# Patient Record
Sex: Female | Born: 1955
Health system: Southern US, Community
[De-identification: ages and names within clinical notes are randomized; demographics above are authoritative.]

## PROBLEM LIST (undated history)

## (undated) DIAGNOSIS — K222 Esophageal obstruction: Secondary | ICD-10-CM

## (undated) DIAGNOSIS — J449 Chronic obstructive pulmonary disease, unspecified: Secondary | ICD-10-CM

## (undated) DIAGNOSIS — Z720 Tobacco use: Secondary | ICD-10-CM

## (undated) DIAGNOSIS — K219 Gastro-esophageal reflux disease without esophagitis: Secondary | ICD-10-CM

## (undated) DIAGNOSIS — D472 Monoclonal gammopathy: Secondary | ICD-10-CM

## (undated) DIAGNOSIS — J310 Chronic rhinitis: Secondary | ICD-10-CM

## (undated) DIAGNOSIS — M810 Age-related osteoporosis without current pathological fracture: Secondary | ICD-10-CM

## (undated) HISTORY — DX: Chronic obstructive pulmonary disease, unspecified: J44.9

## (undated) HISTORY — DX: Age-related osteoporosis without current pathological fracture: M81.0

## (undated) HISTORY — DX: Gastro-esophageal reflux disease without esophagitis: K21.9

## (undated) HISTORY — DX: Monoclonal gammopathy: D47.2

## (undated) HISTORY — PX: BREAST EXCISIONAL BIOPSY: SUR124

## (undated) HISTORY — DX: Tobacco use: Z72.0

## (undated) HISTORY — PX: COLONOSCOPY: SHX174

## (undated) HISTORY — PX: TONSILLECTOMY: SHX5217

## (undated) HISTORY — PX: ESOPHAGOGASTRODUODENOSCOPY: SHX1529

## (undated) HISTORY — DX: Chronic rhinitis: J31.0

## (undated) HISTORY — PX: BREAST SURGERY: SHX581

## (undated) HISTORY — PX: TOTAL ABDOMINAL HYSTERECTOMY: SHX209

## (undated) HISTORY — DX: Esophageal obstruction: K22.2

## (undated) HISTORY — PX: BREAST BIOPSY: SHX20

---

## 1998-03-07 ENCOUNTER — Emergency Department (HOSPITAL_COMMUNITY): Admission: EM | Admit: 1998-03-07 | Discharge: 1998-03-07 | Payer: Self-pay

## 2000-06-24 ENCOUNTER — Emergency Department (HOSPITAL_COMMUNITY): Admission: EM | Admit: 2000-06-24 | Discharge: 2000-06-24 | Payer: Self-pay | Admitting: Emergency Medicine

## 2005-12-26 ENCOUNTER — Emergency Department (HOSPITAL_COMMUNITY): Admission: EM | Admit: 2005-12-26 | Discharge: 2005-12-26 | Payer: Self-pay | Admitting: Emergency Medicine

## 2006-02-06 ENCOUNTER — Ambulatory Visit (HOSPITAL_COMMUNITY): Admission: RE | Admit: 2006-02-06 | Discharge: 2006-02-06 | Payer: Self-pay | Admitting: Orthopedic Surgery

## 2006-12-25 ENCOUNTER — Emergency Department (HOSPITAL_COMMUNITY): Admission: EM | Admit: 2006-12-25 | Discharge: 2006-12-25 | Payer: Self-pay | Admitting: Emergency Medicine

## 2008-12-14 ENCOUNTER — Emergency Department (HOSPITAL_COMMUNITY): Admission: EM | Admit: 2008-12-14 | Discharge: 2008-12-14 | Payer: Self-pay | Admitting: Family Medicine

## 2009-01-09 ENCOUNTER — Encounter: Payer: Self-pay | Admitting: Pulmonary Disease

## 2009-01-09 ENCOUNTER — Ambulatory Visit: Payer: Self-pay | Admitting: Pulmonary Disease

## 2009-01-09 DIAGNOSIS — J310 Chronic rhinitis: Secondary | ICD-10-CM | POA: Insufficient documentation

## 2009-01-09 DIAGNOSIS — R05 Cough: Secondary | ICD-10-CM

## 2009-01-09 DIAGNOSIS — R059 Cough, unspecified: Secondary | ICD-10-CM | POA: Insufficient documentation

## 2009-01-09 DIAGNOSIS — F17200 Nicotine dependence, unspecified, uncomplicated: Secondary | ICD-10-CM | POA: Insufficient documentation

## 2009-03-05 ENCOUNTER — Ambulatory Visit: Payer: Self-pay | Admitting: Pulmonary Disease

## 2009-03-05 DIAGNOSIS — K5900 Constipation, unspecified: Secondary | ICD-10-CM | POA: Insufficient documentation

## 2009-10-07 ENCOUNTER — Telehealth (INDEPENDENT_AMBULATORY_CARE_PROVIDER_SITE_OTHER): Payer: Self-pay | Admitting: *Deleted

## 2009-10-07 ENCOUNTER — Ambulatory Visit: Payer: Self-pay | Admitting: Pulmonary Disease

## 2009-10-07 DIAGNOSIS — J209 Acute bronchitis, unspecified: Secondary | ICD-10-CM | POA: Insufficient documentation

## 2009-11-14 ENCOUNTER — Emergency Department (HOSPITAL_COMMUNITY): Admission: EM | Admit: 2009-11-14 | Discharge: 2009-11-14 | Payer: Self-pay | Admitting: Family Medicine

## 2010-07-22 NOTE — Progress Notes (Signed)
Summary: appt  Phone Note Call from Patient Call back at Work Phone [phone]   Caller: Patient Call For: sood Summary of Call: Sinus inf wants to be seen today. Initial call taken by: Darletta Moll,  October 07, 2009 9:34 AM  Follow-up for Phone Call        called and spoke with pt.  pt works at [facility] and believes she has a sinus infection and would like to be seen today.  Pt scheduled to see TP today at noon.  Aundra Millet Reynolds LPN  October 07, 2009 9:43 AM

## 2010-07-22 NOTE — Assessment & Plan Note (Signed)
Summary: sinus infection/mg   Copy to:  Dr. Sherene Sires  CC:  fever for 3 days - sob - prod cough(yellow) - Sinus congestion - sinus drainage (yellow).  History of Present Illness: 55 yo f/u for Cough, borderline COPD, tobacco abuse, and rhinitis.   October 07, 2009--Presents for an acute office visit. Complains of fever for 3 days - sob - prod cough(yellow) - Sinus congestion - sinus drainage (yellow). OTC not helping. She is still trying to quit smoking. Cough is getting worse. Cant sleep.Has to work all weekend. Sinus discharge green/yellow. Denies chest pain, dyspnea, orthopnea, hemoptysis, fever, n/v/d, edema, headache,recent travel or antibiotics.    Current Medications (verified): 1)  Nasonex 50 Mcg/act Susp (Mometasone Furoate) .... Two Sprays Once Daily As Needed 2)  Symbicort 160-4.5 Mcg/act Aero (Budesonide-Formoterol Fumarate) .... Two Puffs Two Times A Day 3)  Proair Hfa 108 (90 Base) Mcg/act Aers (Albuterol Sulfate) .... Two Puffs Qid As Needed  Allergies (verified): No Known Drug Allergies  Comments:  Nurse/Medical Assistant: The patient's medications and allergies were reviewed with the patient and were updated in the Medication and Allergy Lists.  Past History:  Past Medical History: Last updated: 01/09/2009 Cough      - Spirometry 01/09/09 FEV1 2.20(86%), FVC 3.14(93%), FEV1% 70 Rhinitis Tobacco abuse  Past Surgical History: Last updated: 01/09/2009 Hysterectomy Tonsillectomy  Family History: Last updated: 03/05/2009 Unknown family history---Pt is adopted  Social History: Last updated: 01/09/2009 Patient is a current smoker.  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 [employer]  Risk Factors: Smoking Status: current (01/09/2009) Packs/Day: 1.0 (01/09/2009)  Review of Systems      See HPI  Vital Signs:  Patient profile:   55 year old female Weight:      107 pounds O2 Sat:      96 % on Room air Temp:     97.8 degrees F oral Pulse rate:   80 /  minute BP sitting:   98 / 64  (left arm) Cuff size:   regular  Vitals Entered By: Abigail Miyamoto RN (October 07, 2009 11:59 AM)  O2 Flow:  Room air   Impression & Recommendations:  Problem # 1:  ACUTE BRONCHITIS (ICD-466.0)  Omnicef 300mg  two times a day for 7 days Mucinex DM two times a day as needed cough/congeston  Hydromet 1-2 tsp every 4-6 hr as needed cough-may make you sleepy.  Saline nasal rinses.  Patanse 2 puffs two times a day until sample is gone.  Please contact office for sooner follow up if symptoms do not improve or worsen  Her updated medication list for this problem includes:    Symbicort 160-4.5 Mcg/act Aero (Budesonide-formoterol fumarate) .Marland Kitchen..Marland Kitchen Two puffs two times a day    Proair Hfa 108 (90 Base) Mcg/act Aers (Albuterol sulfate) .Marland Kitchen..Marland Kitchen Two puffs qid as needed    Cefdinir 300 Mg Caps (Cefdinir) .Marland Kitchen... 1 by mouth two times a day    Hydromet 5-1.5 Mg/27ml Syrp (Hydrocodone-homatropine) .Marland Kitchen... 1-2 tsp every 4-6 hr as needed cough  Orders: Est. Patient Level IV (91478)  Medications Added to Medication List This Visit: 1)  Symbicort 160-4.5 Mcg/act Aero (Budesonide-formoterol fumarate) .... Two puffs two times a day 2)  Cefdinir 300 Mg Caps (Cefdinir) .Marland Kitchen.. 1 by mouth two times a day 3)  Hydromet 5-1.5 Mg/12ml Syrp (Hydrocodone-homatropine) .Marland Kitchen.. 1-2 tsp every 4-6 hr as needed cough  Complete Medication List: 1)  Nasonex 50 Mcg/act Susp (Mometasone furoate) .... Two sprays once daily as needed 2)  Symbicort 160-4.5 Mcg/act Aero (Budesonide-formoterol fumarate) .... Two puffs two times a day 3)  Proair Hfa 108 (90 Base) Mcg/act Aers (Albuterol sulfate) .... Two puffs qid as needed 4)  Cefdinir 300 Mg Caps (Cefdinir) .Marland Kitchen.. 1 by mouth two times a day 5)  Hydromet 5-1.5 Mg/40ml Syrp (Hydrocodone-homatropine) .Marland Kitchen.. 1-2 tsp every 4-6 hr as needed cough  Patient Instructions: 1)  Omnicef 300mg  two times a day for 7 days 2)  Mucinex DM two times a day as needed cough/congeston    3)  Hydromet 1-2 tsp every 4-6 hr as needed cough-may make you sleepy.  4)  Saline nasal rinses.  5)  Patanse 2 puffs two times a day until sample is gone.  6)  Please contact office for sooner follow up if symptoms do not improve or worsen  Prescriptions: HYDROMET 5-1.5 MG/5ML SYRP (HYDROCODONE-HOMATROPINE) 1-2 tsp every 4-6 hr as needed cough  #8 oz x 0    Entered and Authorized by:   Rubye Oaks NP   Signed by:   Sae Handrich NP on 10/07/2009   Method used:   Print then Give to Patient   RxID:   [id number] CEFDINIR 300 MG CAPS (CEFDINIR) 1 by mouth two times a day  #14 x 0   Entered and Authorized by:   Rubye Oaks NP   Signed by:   Rubye Oaks NP on 10/07/2009   Method used:   Electronically to        [facility]* (retail)       [address]       [address] [place] [address]       [place], [place]  [address]       Ph: [phone]       Fax: [phone]   RxID:   [id number]   Appended Document: sinus infection/mg    Clinical Lists Changes  Orders: Added new Service order of Nebulizer Tx (87564) - Signed

## 2011-01-30 ENCOUNTER — Inpatient Hospital Stay (INDEPENDENT_AMBULATORY_CARE_PROVIDER_SITE_OTHER)
Admission: RE | Admit: 2011-01-30 | Discharge: 2011-01-30 | Disposition: A | Discharge status: Home / Self Care | Payer: 59 | Source: Ambulatory Visit | Attending: Family Medicine | Admitting: Family Medicine

## 2011-01-30 DIAGNOSIS — L089 Local infection of the skin and subcutaneous tissue, unspecified: Secondary | ICD-10-CM

## 2011-03-28 ENCOUNTER — Telehealth: Payer: Self-pay | Admitting: Pulmonary Disease

## 2011-03-28 NOTE — Telephone Encounter (Signed)
Prescribed augmentin 875 mg po bid x 7 days

## 2011-07-13 ENCOUNTER — Telehealth: Payer: Self-pay | Admitting: Adult Health

## 2011-07-13 ENCOUNTER — Other Ambulatory Visit (INDEPENDENT_AMBULATORY_CARE_PROVIDER_SITE_OTHER): Payer: 59

## 2011-07-13 ENCOUNTER — Ambulatory Visit (INDEPENDENT_AMBULATORY_CARE_PROVIDER_SITE_OTHER): Payer: 59 | Admitting: Adult Health

## 2011-07-13 ENCOUNTER — Encounter: Payer: Self-pay | Admitting: Adult Health

## 2011-07-13 VITALS — BP 120/76 | HR 98 | Temp 97.7°F | Ht 65.0 in | Wt 103.0 lb

## 2011-07-13 DIAGNOSIS — J209 Acute bronchitis, unspecified: Secondary | ICD-10-CM

## 2011-07-13 LAB — BETA STREP SCREEN: Streptococcus, Group A Screen (Direct): NEGATIVE

## 2011-07-13 MED ORDER — CEFDINIR 300 MG PO CAPS: 300.0000 mg | ORAL_CAPSULE | Freq: Two times a day (BID) | ORAL | Status: AC

## 2011-07-13 MED ORDER — DOXYCYCLINE HYCLATE 100 MG PO TABS: 100.0000 mg | ORAL_TABLET | Freq: Two times a day (BID) | ORAL | Status: DC

## 2011-07-13 MED ORDER — FIRST-DUKES MOUTHWASH MT SUSP: OROMUCOSAL | Status: DC

## 2011-07-13 NOTE — Assessment & Plan Note (Addendum)
Suspect viral URI /bronchitis  Check strep test   Plan:  Mucinex DM Twice daily  As needed  Cough/congestion  Zyrtec 10mg  At bedtime  For 5 days  Saline nasal rinses As needed   Quit smoking.  Doxycycline 100mg  Twice daily  - to have on hold if symptoms worsen with discolored mucus.  I will call with lab work.  May use Majic mouthwash As needed for sore throat.  Please contact office for sooner follow up if symptoms do not improve or worsen or seek emergency care  follow up Dr. Craige Cotta  In 3 months and As needed

## 2011-07-13 NOTE — Telephone Encounter (Signed)
Omnicef 300 mg Twice daily  For 7 days #14 no refills  Please contact office for sooner follow up if symptoms do not improve or worsen or seek emergency care   follow up as planned and As needed

## 2011-07-13 NOTE — Progress Notes (Signed)
Addended by: Boone Master E on: 07/13/2011 09:41 AM   Modules accepted: Orders

## 2011-07-13 NOTE — Progress Notes (Signed)
Reviewed and agree with assessment/plan. 

## 2011-07-13 NOTE — Patient Instructions (Addendum)
Mucinex DM Twice daily  As needed  Cough/congestion  Zyrtec 10mg  At bedtime  For 5 days  Saline nasal rinses As needed   Quit smoking.  Doxycycline 100mg  Twice daily  - to have on hold if symptoms worsen with discolored mucus.  I will call with lab work.  May use Majic mouthwash As needed for sore throat.  Please contact office for sooner follow up if symptoms do not improve or worsen or seek emergency care  follow up Dr. Craige Cotta  In 3 months and As needed

## 2011-07-13 NOTE — Telephone Encounter (Signed)
New med called into pharmacy. Carron Curie, CMA

## 2011-07-13 NOTE — Telephone Encounter (Signed)
[facility] calling stating they are out of doxycyline and would like an alternative. Please advise, Tammy, thanks  No Known Allergies

## 2011-07-13 NOTE — Progress Notes (Signed)
  Subjective:    Patient ID: Robin Rivera, female    DOB: 1956-06-03, 56 y.o.   MRN: [id number]  HPI 56 yo f/u for Cough, borderline COPD, tobacco abuse, and rhinitis.       07/13/2011 Acute OV  Complains of sore throat, dry cough, low grade up to 99.8, chest congestion for 1 days. No otc used.  No hemoptysis or chest pain. Sore throat is worst symptom. Still smoking .  Advised on smoking cesstation .Does have drainage and nasal congesiton    Past Medical History:  Cough  - Spirometry 01/09/09 FEV1 2.20(86%), FVC 3.14(93%), FEV1% 70  Rhinitis  Tobacco abuse     Review of Systems  Constitutional:   No  weight loss, night sweats,  Fevers, chills, fatigue, or  lassitude.  HEENT:   No headaches,  Difficulty swallowing,  Tooth/dental problems, or   +Sore throat,                No sneezing, itching, ear ache,  +nasal congestion, post nasal drip,   CV:  No chest pain,  Orthopnea, PND, swelling in lower extremities, anasarca, dizziness, palpitations, syncope.   GI  No heartburn, indigestion, abdominal pain, nausea, vomiting, diarrhea, change in bowel habits, loss of appetite, bloody stools.   Resp: o coughing up of blood.    No chest wall deformity  Skin: no rash or lesions.  GU: no dysuria, change in color of urine, no urgency or frequency.  No flank pain, no hematuria   MS:  No joint pain or swelling.  No decreased range of motion.  No back pain.  Psych:  No change in mood or affect. No depression or anxiety.  No memory loss.         Objective:   Physical Exam GEN: A/Ox3; pleasant , NAD, thin   HEENT:  [place]/AT,  EACs-clear, TMs-wnl, NOSE-clear, THROAT-clear, no lesions, no postnasal drip or exudate noted.   NECK:  Supple w/ fair ROM; no JVD; normal carotid impulses w/o bruits; no thyromegaly or nodules palpated; no lymphadenopathy.  RESP  Clear  P & A; w/o, wheezes/ rales/ or rhonchi.no accessory muscle use, no dullness to percussion  CARD:  RRR, no m/r/g  , no  peripheral edema, pulses intact, no cyanosis or clubbing.  GI:   Soft & nt; nml bowel sounds; no organomegaly or masses detected.  Musco: Warm bil, no deformities or joint swelling noted.   Neuro: alert, no focal deficits noted.    Skin: Warm, no lesions or rashes         Assessment & Plan:

## 2012-12-11 ENCOUNTER — Telehealth: Payer: Self-pay | Admitting: Pulmonary Disease

## 2012-12-11 NOTE — Telephone Encounter (Signed)
[phone] pt calling back

## 2012-12-11 NOTE — Telephone Encounter (Addendum)
Called # provided above.   Was advised pt has just went to lunch. Co worker tried to transfer but pts work phone was busy. She will inform pt we called back and have her return call.  Last seen by TP on 06/2011. No recent OV with VS

## 2012-12-11 NOTE — Telephone Encounter (Signed)
I spoke with pt. She stated she has a tooth abscess and is requesting an abx. I advised pt she needed to contact her PCP or her dentists for this. She has not been seen since Jan by TP and has no pending appt. She voiced her understanding. Will forward to VS as an [place]

## 2013-04-21 ENCOUNTER — Emergency Department (HOSPITAL_COMMUNITY)
Admission: EM | Admit: 2013-04-21 | Discharge: 2013-04-21 | Disposition: A | Discharge status: Home / Self Care | Payer: 59 | Source: Home / Self Care | Attending: Emergency Medicine | Admitting: Emergency Medicine

## 2013-04-21 ENCOUNTER — Encounter (HOSPITAL_COMMUNITY): Payer: Self-pay | Admitting: Emergency Medicine

## 2013-04-21 DIAGNOSIS — J019 Acute sinusitis, unspecified: Secondary | ICD-10-CM

## 2013-04-21 MED ORDER — FLUTICASONE PROPIONATE 50 MCG/ACT NA SUSP: 2.0000 | Freq: Every day | NASAL | Status: DC

## 2013-04-21 MED ORDER — AMOXICILLIN-POT CLAVULANATE 875-125 MG PO TABS: 1.0000 | ORAL_TABLET | Freq: Two times a day (BID) | ORAL | Status: DC

## 2013-04-21 NOTE — ED Provider Notes (Signed)
Chief Complaint:   Chief Complaint  Patient presents with  . 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 She smokes a half pack of cigarettes per day. No sick exposures. She has had her flu vaccine this year.  Review of Systems:  Other than noted above, the patient denies any of the following symptoms: Systemic:  No fevers, chills, sweats, weight loss or gain, fatigue, or tiredness. Eye:  No redness or discharge. ENT:  No ear pain, drainage, headache, nasal congestion, drainage, sinus pressure, difficulty swallowing, or sore throat. Neck:  No neck pain or swollen glands. Lungs:  No cough, sputum production, hemoptysis, wheezing, chest tightness, shortness of breath or chest pain. GI:  No abdominal pain, nausea, vomiting or diarrhea.  PMFSH:  Past medical history, family history, social history, meds, and allergies were reviewed.  Physical Exam:   Vital signs:  BP 128/87  Pulse 81  Temp(Src) 98.9 F (37.2 C) (Oral)  Resp 18  SpO2 99% General:  Alert and oriented.  In no distress.  Skin warm and dry. Eye:  No conjunctival injection or drainage. Lids were normal. ENT:  TMs and canals were normal, without erythema or inflammation.  Nasal mucosa was clear and uncongested, without drainage.  Mucous membranes were moist.  Pharynx was clear with no exudate or drainage.  There were no oral ulcerations or lesions. Neck:  Supple, no adenopathy, tenderness or mass. Lungs:  No respiratory distress.  Lungs were clear to auscultation, without wheezes, rales or rhonchi.  Breath sounds were clear and equal bilaterally.  Heart:  Regular rhythm, without gallops, murmers or rubs. Skin:  Clear,  warm, and dry, without rash or lesions.  Assessment:  The encounter diagnosis was Acute sinusitis.  Plan:   1.  Meds:  The following meds were prescribed:   New Prescriptions   AMOXICILLIN-CLAVULANATE (AUGMENTIN) 875-125 MG PER TABLET    Take 1 tablet by mouth 2 (two) times daily.   FLUTICASONE (FLONASE) 50 MCG/ACT NASAL SPRAY    Place 2 sprays into the nose daily.    2.  Patient Education/Counseling:  The patient was given appropriate handouts, self care instructions, and instructed in symptomatic relief.  Strongly suggested smoking cessation. Also suggested inhaling steam and hot compresses to the face.  3.  Follow up:  The patient was told to follow up if no better in 3 to 4 days, if becoming worse in any way, and given some red flag symptoms such as fever or any difficulty breathing which would prompt immediate return.  Follow up here as necessary.      Reuben Likes, MD 04/21/13 [phone]

## 2013-04-21 NOTE — ED Notes (Signed)
[  Pt  Reports  Symptoms  Of  Nasal  Congestion  With  Cough         Yellowish  Drainage    sotrethroat   Symptoms  X  5  Days         Pt is  A  Smoker     Had  Flu  Shot this  Year

## 2013-05-30 ENCOUNTER — Telehealth: Payer: Self-pay | Admitting: Pulmonary Disease

## 2013-05-30 NOTE — Telephone Encounter (Signed)
Pt has not been seen since 2013. Robin Rivera at [facility] is aware that we will not be able to give script for smoking cessation. Atc the pt but all the #'s I tried for her were either busy or no longer in service. Will try back.

## 2013-05-31 NOTE — Telephone Encounter (Signed)
LMTCB for pt at home number listed in chart.

## 2013-05-31 NOTE — Telephone Encounter (Signed)
Spoke with pt and advised that she would need an appt with Dr Craige Cotta for any rx for smoking cessation.   Appt scheduled.

## 2013-05-31 NOTE — Telephone Encounter (Signed)
Patient returning call.  pls call work # [phone]

## 2013-07-23 ENCOUNTER — Ambulatory Visit: Payer: 59 | Admitting: Pulmonary Disease

## 2013-07-26 ENCOUNTER — Encounter: Payer: Self-pay | Admitting: Pulmonary Disease

## 2013-08-16 ENCOUNTER — Ambulatory Visit: Payer: 59 | Admitting: Pulmonary Disease

## 2013-08-16 ENCOUNTER — Encounter: Payer: Self-pay | Admitting: Pulmonary Disease

## 2013-08-16 ENCOUNTER — Ambulatory Visit (INDEPENDENT_AMBULATORY_CARE_PROVIDER_SITE_OTHER): Payer: 59 | Admitting: Pulmonary Disease

## 2013-08-16 VITALS — BP 96/62 | HR 70 | Ht 65.0 in | Wt 104.0 lb

## 2013-08-16 DIAGNOSIS — J449 Chronic obstructive pulmonary disease, unspecified: Secondary | ICD-10-CM | POA: Insufficient documentation

## 2013-08-16 DIAGNOSIS — J438 Other emphysema: Secondary | ICD-10-CM

## 2013-08-16 DIAGNOSIS — F17200 Nicotine dependence, unspecified, uncomplicated: Secondary | ICD-10-CM

## 2013-08-16 DIAGNOSIS — Z720 Tobacco use: Secondary | ICD-10-CM

## 2013-08-16 DIAGNOSIS — J439 Emphysema, unspecified: Secondary | ICD-10-CM

## 2013-08-16 MED ORDER — BUDESONIDE-FORMOTEROL FUMARATE 160-4.5 MCG/ACT IN AERO: 2.0000 | INHALATION_SPRAY | Freq: Two times a day (BID) | RESPIRATORY_TRACT | Status: DC

## 2013-08-16 MED ORDER — BUPROPION HCL ER (SMOKING DET) 150 MG PO TB12: 150.0000 mg | ORAL_TABLET | Freq: Two times a day (BID) | ORAL | Status: DC

## 2013-08-16 NOTE — Assessment & Plan Note (Signed)
She is eager to quit smoking.  Reviewed different options.  She has tried bupropion in the past with positive results.  Will have her use this again.  Reviewed dosing schedule, target quit date, and potential side effects to monitor for.  She will use nicotine patch as bridge for smoking cessation.  Will also arrange for referral to smoking cessation classes with [facility].

## 2013-08-16 NOTE — Assessment & Plan Note (Signed)
She had borderline obstruction on spirometry from 2010, and likely has progression since.  She will need further assessment with PFT at some point.  Will defer this with the hope that she will be off cigarettes in the near future, and then arrange for assessment then.  Will have her continue with symbicort and prn albuterol.  My suspicion is that her inhaler needs will decrease once she stops smoking.

## 2013-08-16 NOTE — Progress Notes (Signed)
Chief Complaint  Patient presents with  . Nicotine Dependence    Here today to discuss smoking cessation options.    History of Present Illness: Robin Rivera is a 58 y.o. female smoker with COPD.  I last saw her in September 2010.  She continues to smoke up to 1 pack per day.  She has not had symbicort for about 1 year.  She uses albuterol frequently.  She gets frequent episodes of wheezing, especially in cold weather.  She is not having cough or sputum, and denies hemoptysis of chest pain.  She is not having leg swelling.  She is able to keep up with her activities.  TESTS: Spirometry 01/09/09 >> FEV1 2.20 (86%), FEV1% 70   Robin Rivera  has a past medical history of Cough; Rhinitis; and Tobacco abuse.  Robin Rivera  has past surgical history that includes Total abdominal hysterectomy and Tonsillectomy.  Prior to Admission medications   Medication Sig Start Date End Date Taking? Authorizing Provider  albuterol (PROVENTIL HFA;VENTOLIN HFA) 108 (90 BASE) MCG/ACT inhaler Inhale 2 puffs into the lungs every 6 (six) hours as needed.   Yes Historical Provider, MD  budesonide-formoterol (SYMBICORT) 160-4.5 MCG/ACT inhaler Inhale 2 puffs into the lungs 2 (two) times daily.   Yes Historical Provider, MD  fluticasone (FLONASE) 50 MCG/ACT nasal spray Place 2 sprays into the nose daily. 04/21/13  Yes Harden Mo, MD    No Known Allergies   Physical Exam:  General - No distress, thin ENT - No sinus tenderness, no oral exudate, no LAN Cardiac - s1s2 regular, no murmur Chest - No wheeze/rales/dullness Back - No focal tenderness Abd - Soft, non-tender Ext - No edema Neuro - Normal strength Skin - No rashes Psych - normal mood, and behavior   Assessment/Plan:  Robin Mires, MD [place] [facility] Pager:  [phone]

## 2013-08-16 NOTE — Patient Instructions (Signed)
Buproprion 150 mg pills >> take 1 pill daily for 4 days, then increase to 1 pill twice per day.  Stop smoking after using Buproprion for one week. Symbicort two puffs twice per day, and rinse mouth after each use Will refer to smoking cessation classes Follow up in 6 to 8 weeks

## 2013-09-27 ENCOUNTER — Ambulatory Visit: Payer: 59 | Admitting: Pulmonary Disease

## 2013-10-01 ENCOUNTER — Encounter: Payer: Self-pay | Admitting: Pulmonary Disease

## 2014-05-29 ENCOUNTER — Telehealth: Payer: Self-pay | Admitting: Pulmonary Disease

## 2014-05-29 MED ORDER — AMOXICILLIN 500 MG PO CAPS: 500.0000 mg | ORAL_CAPSULE | Freq: Two times a day (BID) | ORAL | Status: DC

## 2014-05-29 NOTE — Telephone Encounter (Signed)
Can send script for amoxicillin 500 mg bid, dispense 20 with no refills.  She still needs to keep f/u with Tammy Parrett.

## 2014-05-29 NOTE — Telephone Encounter (Signed)
Called and spoke to pt. Pt c/o sinus congestion and pressure. Pt stated she has mild SOB in morning. Pt denies headache, CP/tightness and f/c/s. Pt is requesting abx. Pt is taking mucinex prn and theraflu. Pt last seen on 08/16/13 by VS. Pt has pending appt with TP on 06/02/14.   Dr. Halford Chessman please advise.  No Known Allergies

## 2014-05-29 NOTE — Telephone Encounter (Signed)
Called and left message with pt's co-worker. (Pt gave ok to leave message with coworker). Informed coworker of the recs per VS. Rx sent to preferred pharmacy. Co-worker stated she would relay the message to pt. Nothing further needed.

## 2014-06-02 ENCOUNTER — Ambulatory Visit (INDEPENDENT_AMBULATORY_CARE_PROVIDER_SITE_OTHER)
Admission: RE | Admit: 2014-06-02 | Discharge: 2014-06-02 | Disposition: A | Discharge status: Home / Self Care | Payer: 59 | Source: Ambulatory Visit | Attending: Adult Health | Admitting: Adult Health

## 2014-06-02 ENCOUNTER — Encounter: Payer: Self-pay | Admitting: Adult Health

## 2014-06-02 ENCOUNTER — Ambulatory Visit (INDEPENDENT_AMBULATORY_CARE_PROVIDER_SITE_OTHER): Payer: 59 | Admitting: Adult Health

## 2014-06-02 VITALS — BP 124/64 | HR 82 | Temp 98.2°F

## 2014-06-02 DIAGNOSIS — J439 Emphysema, unspecified: Secondary | ICD-10-CM

## 2014-06-02 MED ORDER — PREDNISONE 10 MG PO TABS: ORAL_TABLET | ORAL | Status: DC

## 2014-06-02 NOTE — Progress Notes (Signed)
   Subjective:    Patient ID: Robin Rivera, female    DOB: September 13, 1955, 58 y.o.   MRN: [id number]  HPI 58 yo with COPD , active smoker   06/02/2014 Acute OV  Complains of prod cough with gree/yellow mucus, DOE, wheezing x6 weeks. Worse for the last week . Was called in Was called in Amoxicillin on 12/10 for 10 days . Feels she is starting to feel better.  Denies any f/c/s, n/v/d, hemoptysis.  Still smoking, discussed cessation .  Using mucinex .  Takes Symbicort Twice daily      Review of Systems Constitutional:   No  weight loss, night sweats,  Fevers, chills, + fatigue, or  lassitude.  HEENT:   No headaches,  Difficulty swallowing,  Tooth/dental problems, or  Sore throat,                No sneezing, itching, ear ache,  +nasal congestion, post nasal drip,   CV:  No chest pain,  Orthopnea, PND, swelling in lower extremities, anasarca, dizziness, palpitations, syncope.   GI  No heartburn, indigestion, abdominal pain, nausea, vomiting, diarrhea, change in bowel habits, loss of appetite, bloody stools.   Resp:    No chest wall deformity  Skin: no rash or lesions.  GU: no dysuria, change in color of urine, no urgency or frequency.  No flank pain, no hematuria   MS:  No joint pain or swelling.  No decreased range of motion.  No back pain.  Psych:  No change in mood or affect. No depression or anxiety.  No memory loss.         Objective:   Physical Exam GEN: A/Ox3; pleasant , NAD, well nourished   HEENT:  [place]/AT,  EACs-clear, TMs-wnl, NOSE-clear drainage  THROAT-clear, no lesions, no postnasal drip or exudate noted.   NECK:  Supple w/ fair ROM; no JVD; normal carotid impulses w/o bruits; no thyromegaly or nodules palpated; no lymphadenopathy.  RESP  Few exp wheezes no accessory muscle use, no dullness to percussion  CARD:  RRR, no m/r/g  , no peripheral edema, pulses intact, no cyanosis or clubbing.  GI:   Soft & nt; nml bowel sounds; no organomegaly or masses  detected.  Musco: Warm bil, no deformities or joint swelling noted.   Neuro: alert, no focal deficits noted.    Skin: Warm, no lesions or rashes         Assessment & Plan:

## 2014-06-02 NOTE — Progress Notes (Signed)
Reviewed and agree with assessment/plan. 

## 2014-06-02 NOTE — Assessment & Plan Note (Signed)
Slow resolving Flare  Check cxr   Plan  Prednisone taper over next week  Finish Amoxicillin  Saline nasal rinses  Mucinex DM Twice daily As needed  Cough/congestion .  Continue on Symbicort two puffs twice per day, and rinse mouth after each use Keep working on stopping smoking  Follow up Dr. Halford Chessman  In 3 months and As needed

## 2014-06-02 NOTE — Patient Instructions (Addendum)
Prednisone taper over next week  Finish Amoxicillin  Saline nasal rinses  Mucinex DM Twice daily As needed  Cough/congestion .  Continue on Symbicort two puffs twice per day, and rinse mouth after each use Keep working on stopping smoking  Follow up Dr. Halford Chessman  In 3 months and As needed

## 2014-06-05 NOTE — Progress Notes (Signed)
Quick Note:  Called spoke with patient, advised of cxr results / recs as stated by TP. Pt verbalized her understanding and denied any questions. ______ 

## 2015-11-13 ENCOUNTER — Telehealth: Payer: Self-pay | Admitting: Pulmonary Disease

## 2015-11-17 NOTE — Telephone Encounter (Signed)
Called # provided, spoke with pt's son.  Was advised to call pt on her cell # at [phone] [email or web address] for pt on cell #

## 2015-11-17 NOTE — Telephone Encounter (Signed)
Patient is returning call, there is a referral for lung cancer screening, believe this is why she was called.  CB is [phone] (work).

## 2015-11-19 NOTE — Telephone Encounter (Signed)
Routing to Lung Nodule Pool for follow up.

## 2015-12-03 NOTE — Telephone Encounter (Signed)
LVM for pt to return call

## 2015-12-28 NOTE — Telephone Encounter (Signed)
LVM for pt to return call

## 2015-12-31 ENCOUNTER — Other Ambulatory Visit: Payer: Self-pay | Admitting: Acute Care

## 2015-12-31 DIAGNOSIS — F17210 Nicotine dependence, cigarettes, uncomplicated: Secondary | ICD-10-CM

## 2016-01-20 NOTE — Telephone Encounter (Signed)
Robin Rivera can we close this message?  It looks like the pt has not been contacted about the lung cancer screening, but it looks like many messages have been left for her.  thanks

## 2016-01-20 NOTE — Telephone Encounter (Signed)
Pt is scheduled for 8/16 with SG for SDMV.  Nothing further is needed.

## 2016-02-03 ENCOUNTER — Ambulatory Visit (INDEPENDENT_AMBULATORY_CARE_PROVIDER_SITE_OTHER)
Admission: RE | Admit: 2016-02-03 | Discharge: 2016-02-03 | Disposition: A | Discharge status: Home / Self Care | Payer: 59 | Source: Ambulatory Visit | Attending: Acute Care | Admitting: Acute Care

## 2016-02-03 ENCOUNTER — Telehealth: Payer: Self-pay

## 2016-02-03 ENCOUNTER — Ambulatory Visit (INDEPENDENT_AMBULATORY_CARE_PROVIDER_SITE_OTHER): Payer: 59 | Admitting: Acute Care

## 2016-02-03 ENCOUNTER — Encounter: Payer: Self-pay | Admitting: Acute Care

## 2016-02-03 DIAGNOSIS — Z87891 Personal history of nicotine dependence: Secondary | ICD-10-CM

## 2016-02-03 DIAGNOSIS — F17210 Nicotine dependence, cigarettes, uncomplicated: Secondary | ICD-10-CM | POA: Diagnosis not present

## 2016-02-03 NOTE — Telephone Encounter (Signed)
CALL REPORT from [facility] for CT scan. Marciano Sequin did not know reason for call report. Recommended results be reviewed.   Routed to Eric Form for follow up.

## 2016-02-03 NOTE — Progress Notes (Signed)
Shared Decision Making Visit Lung Cancer Screening Program [phone])   Eligibility:  Age 60 y.o.  Pack Years Smoking History Calculation  34.5 pack years (# packs/per year x # years smoked)  Recent History of coughing up blood  no  Unexplained weight loss? no ( >Than 15 pounds within the last 6 months )  Prior History Lung / other cancer no (Diagnosis within the last 5 years already requiring surveillance chest CT Scans).  Smoking Status Current Smoker  Former Smokers: Years since quit:NA  Quit Date: NA  Visit Components:  Discussion included one or more decision making aids. yes  Discussion included risk/benefits of screening. yes  Discussion included potential follow up diagnostic testing for abnormal scans. yes  Discussion included meaning and risk of over diagnosis. yes  Discussion included meaning and risk of False Positives. yes  Discussion included meaning of total radiation exposure. yes  Counseling Included:  Importance of adherence to annual lung cancer LDCT screening. yes  Impact of comorbidities on ability to participate in the program. yes  Ability and willingness to under diagnostic treatment. yes  Smoking Cessation Counseling:  Current Smokers:   Discussed importance of smoking cessation. yes  Information about tobacco cessation classes and interventions provided to patient. yes  Patient provided with "ticket" for LDCT Scan. yes  Symptomatic Patient. no  Counseling  Diagnosis Code: Tobacco Use Z72.0  Asymptomatic Patient yes  Counseling (Intermediate counseling: > three minutes counseling) [id number]  Former Smokers:   Discussed the importance of maintaining cigarette abstinence. yes  Diagnosis Code: Personal History of Nicotine Dependence. [id number]  Information about tobacco cessation classes and interventions provided to patient. Yes  Patient provided with "ticket" for LDCT Scan. yes  Written Order for Lung Cancer Screening with LDCT  placed in Epic. Yes (CT Chest Lung Cancer Screening Low Dose W/O CM) [id number] Z12.2-Screening of respiratory organs Z87.891-Personal history of nicotine dependence  I have spent 20 minutes of face to face time with Robin Rivera discussing the risks and benefits of lung cancer screening. We viewed a power point together that explained in detail the above noted topics. We paused at intervals to allow for questions to be asked and answered to ensure understanding.We discussed that the single most powerful action that she  can take to decrease her  risk of developing lung cancer is to quit smoking. We discussed whether or not she  is ready to commit to setting a quit date. She is currently not ready to set a quit date but has started the smoking cessation classes..We discussed options for tools to aid in quitting smoking including nicotine replacement therapy, non-nicotine medications, support groups, Quit Smart classes, and behavior modification. We discussed that often times setting smaller, more achievable goals, such as eliminating 1 cigarette a day for a week and then 2 cigarettes a day for a week can be helpful in slowly decreasing the number of cigarettes smoked. This allows for a sense of accomplishment as well as providing a clinical benefit. I gave her  the " Be Stronger Than Your Excuses" card with contact information for community resources, classes, free nicotine replacement therapy, and access to mobile apps, text messaging, and on-line smoking cessation help. I have also given her  my card and contact information in the event she needs to contact me. We discussed the time and location of the scan, and that either June Leap, CMA, or I will call with the results within 24-48 hours of receiving them. I have provided her  with a copy of the power point we viewed  as a resource in the event they need reinforcement of the concepts we discussed today in the office. The patient verbalized understanding of all  of  the above and had no further questions upon leaving the office. They have my contact information in the event they have any further questions.   Robin Spatz, NP 02/03/2016

## 2016-02-04 ENCOUNTER — Encounter: Payer: Self-pay | Admitting: Internal Medicine

## 2016-02-04 ENCOUNTER — Ambulatory Visit (INDEPENDENT_AMBULATORY_CARE_PROVIDER_SITE_OTHER): Payer: 59 | Admitting: Internal Medicine

## 2016-02-04 ENCOUNTER — Other Ambulatory Visit: Payer: 59

## 2016-02-04 VITALS — BP 94/62 | HR 71 | Ht 65.0 in | Wt 100.2 lb

## 2016-02-04 DIAGNOSIS — J449 Chronic obstructive pulmonary disease, unspecified: Secondary | ICD-10-CM

## 2016-02-04 DIAGNOSIS — R0789 Other chest pain: Secondary | ICD-10-CM

## 2016-02-04 DIAGNOSIS — R1310 Dysphagia, unspecified: Secondary | ICD-10-CM | POA: Insufficient documentation

## 2016-02-04 DIAGNOSIS — I2510 Atherosclerotic heart disease of native coronary artery without angina pectoris: Secondary | ICD-10-CM | POA: Insufficient documentation

## 2016-02-04 NOTE — Progress Notes (Signed)
Subjective:     Patient ID: Robin Rivera, female   DOB: Sep 27, 1955, 60 y.o.   MRN: [id number]  HPI    OV 02/04/2016  Chief Complaint  Patient presents with  . Follow-up    Pt here to revirew CT chest. Pt denies any current complaints.     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 Has not seen him in over 2 years. Has been maintained on Symbicort. She does not recollect when her last Pulmonary  function test is. She is here because of abnormal findings on lung cancer screening CT including finding of cor pulmonale/hypertension. However she does also have symptoms  In terms of COPD: She has chronic shortness of breath and cough moderate and mild intensity respectively. Present on exertion while running around dogs. Relieved by rest. The cough is mild. She is on Symbicort. Overall she feels is a stable. However she does not know when her last PFT was. She also does not recollect having had an alpha 1 check.. CT shows evidence of pulmonary hypertension but she denies any edema or orthopnea paroxysmal nocturnal dyspnea  New symptom of dysphagia: This is been going on for a few months. Both solids and liquids get stuck and she points to lower chest area. It is mild to moderate in intensity. It is not progressive. Lung cancer screening CT does show esophageal air fluid level. She has never seen a GI doctor  Coronary artery calcification: This is noted on the CT scan. She does have atypical chest pains at rest but never on exertion. Last cardiac stress test was several years ago and she does not remember who it was done by. She does not have a cardiologist. There is no diaphoresis or palpitations.    Ct Chest Lung Ca Screen Low Dose W/o Cm  Result Date: 02/03/2016 CLINICAL DATA:  34.5 pack-year smoking history.  Asymptomatic. EXAM: CT CHEST WITHOUT CONTRAST LOW-DOSE FOR LUNG CANCER SCREENING TECHNIQUE: Multidetector CT imaging of  the chest was performed following the standard protocol without IV contrast. COMPARISON:  Chest radiograph 06/02/2014.  No prior CT. FINDINGS: Cardiovascular: Bovine arch. Aortic and branch vessel atherosclerosis. Normal heart size, without pericardial effusion. Pulmonary artery enlargement, including a 3.9 cm left pulmonary artery and a 2.9 cm right pulmonary artery. Mediastinum/Nodes: No mediastinal or definite hilar adenopathy, given limitations of unenhanced CT. Fluid level in the esophagus on image 34/series 2. Prominent thoracic duct, including on image 48/series 2. Lungs/Pleura: No pleural fluid. Moderate centrilobular emphysema. Scattered bilateral pulmonary nodules, including at maximally volume derived equivalent diameter 3.1 mm in the right upper lobe/apex. Upper abdomen: Left hepatic lobe hypo attenuating lesion is likely a cyst. Normal imaged portions of the spleen, stomach, pancreas, adrenal glands, kidneys. Abdominal aortic atherosclerosis. Musculoskeletal: No acute osseous abnormality. IMPRESSION: 1. Lung-RADS Category 2, benign appearance or behavior. Continue annual screening with low-dose chest CT without contrast in 12 months. 2. Pulmonary artery enlargement, including a significantly enlarged left pulmonary artery. This suggests pulmonary arterial hypertension. Given the extent of pulmonary artery enlargement, consider echocardiography for further characterization. 3. Esophageal air fluid level suggests dysmotility or gastroesophageal reflux. 4.  Aortic atherosclerosis. These results will be called to the ordering clinician or representative by the Radiologist Assistant, and communication documented in the PACS or zVision Dashboard. Electronically Signed   By: Abigail Miyamoto M.D.   On: 02/03/2016 11:26      has a past medical history of Cough; Rhinitis;  and Tobacco abuse.   reports that she has been smoking Cigarettes.  She has a 34.50 pack-year smoking history. She has never used smokeless  tobacco.  Past Surgical History:  Procedure Laterality Date  . TONSILLECTOMY    . TOTAL ABDOMINAL HYSTERECTOMY      No Known Allergies  Immunization History  Administered Date(s) Administered  . Influenza Split 04/12/2011, 04/15/2013, 03/25/2014  . Influenza,inj,Quad PF,36+ Mos 03/21/2015    Family History  Problem Relation Age of Onset  . Adopted: Yes     Current Outpatient Prescriptions:  .  albuterol (PROVENTIL HFA;VENTOLIN HFA) 108 (90 BASE) MCG/ACT inhaler, Inhale 2 puffs into the lungs every 6 (six) hours as needed., Disp: , Rfl:  .  budesonide-formoterol (SYMBICORT) 160-4.5 MCG/ACT inhaler, Inhale 2 puffs into the lungs 2 (two) times daily. (Patient not taking: Reported on 02/04/2016), Disp: 1 Inhaler, Rfl: 5 .  buPROPion (ZYBAN) 150 MG 12 hr tablet, Take 1 tablet (150 mg total) by mouth 2 (two) times daily. (Patient not taking: Reported on 02/04/2016), Disp: 60 tablet, Rfl: 3 .  fluticasone (FLONASE) 50 MCG/ACT nasal spray, Place 2 sprays into the nose daily. (Patient not taking: Reported on 02/04/2016), Disp: 16 g, Rfl: 0     Review of Systems     Objective:   Physical Exam  Constitutional: She is oriented to person, place, and time. She appears well-developed and well-nourished. No distress.  HENT:  Head: Normocephalic and atraumatic.  Right Ear: External ear normal.  Left Ear: External ear normal.  Mouth/Throat: Oropharynx is clear and moist. No oropharyngeal exudate.  Eyes: Conjunctivae and EOM are normal. Pupils are equal, round, and reactive to light. Right eye exhibits no discharge. Left eye exhibits no discharge. No scleral icterus.  Neck: Normal range of motion. Neck supple. No JVD present. No tracheal deviation present. No thyromegaly present.  Cardiovascular: Normal rate, regular rhythm, normal heart sounds and intact distal pulses.  Exam reveals no gallop and no friction rub.   No murmur heard. Pulmonary/Chest: Effort normal and breath sounds normal. No  respiratory distress. She has no wheezes. She has no rales. She exhibits no tenderness.  Abdominal: Soft. Bowel sounds are normal. She exhibits no distension and no mass. There is no tenderness. There is no rebound and no guarding.  Musculoskeletal: Normal range of motion. She exhibits no edema or tenderness.  Lymphadenopathy:    She has no cervical adenopathy.  Neurological: She is alert and oriented to person, place, and time. She has normal reflexes. No cranial nerve deficit. She exhibits normal muscle tone. Coordination normal.  Skin: Skin is warm and dry. No rash noted. She is not diaphoretic. No erythema. No pallor.  Psychiatric: She has a normal mood and affect. Her behavior is normal. Judgment and thought content normal.  Vitals reviewed.  Vitals:   02/04/16 1210  BP: 94/62  Pulse: 71   Vitals:   02/04/16 1210  BP: 94/62  Pulse: 71  SpO2: 98%  Weight: 100 lb 3.2 oz (45.5 kg)  Height: 5\' 5"  (1.651 m)        Assessment:       ICD-9-CM ICD-10-CM   1. Chronic obstructive pulmonary disease, unspecified COPD type ([place]) 496 J44.9   2. Dysphagia 787.20 R13.10   3. Atypical chest pain 786.59 R07.89   4. Coronary artery calcification seen on CAT scan 414.00 I25.10        Plan:     Chronic obstructive pulmonary disease, unspecified COPD type ([place]) - need to  restagel; do full PFT and return to see Dr Halford Chessman - alpha 1 blood test  Dysphagia - refer [facility] - Might need autoimmune workup based on pulmonary hypertension findings and esophageal dysmotility  Atypical chest pain Coronary artery calcification seen on CAT scan - rfer [facility]   Followup  - Dr Halford Chessman but after PFT in next few weeks to few months  Dr. Brand Males, M.D., [facility].C.P Pulmonary and Critical Care Medicine Staff Physician [facility] Pager: [phone], If no answer or between  15:00h - 7:00h: call [phone]  02/04/2016 12:35 PM

## 2016-02-04 NOTE — Patient Instructions (Addendum)
Chronic obstructive pulmonary disease, unspecified COPD type ([place]) - need to restagel; do full PFT and return to see Dr Halford Chessman - alpha 1 blood test  Dysphagia - refer [facility] - Might need autoimmune for worwokruop  Atypical chest pain Coronary artery calcification seen on CAT scan - rfer [facility]   Followup  - Dr Halford Chessman but after PFT in next few weeks to few months

## 2016-02-09 ENCOUNTER — Other Ambulatory Visit: Payer: Self-pay | Admitting: Acute Care

## 2016-02-09 ENCOUNTER — Ambulatory Visit (INDEPENDENT_AMBULATORY_CARE_PROVIDER_SITE_OTHER): Payer: 59 | Admitting: Internal Medicine

## 2016-02-09 DIAGNOSIS — R0789 Other chest pain: Secondary | ICD-10-CM

## 2016-02-09 DIAGNOSIS — F17210 Nicotine dependence, cigarettes, uncomplicated: Secondary | ICD-10-CM

## 2016-02-09 DIAGNOSIS — J449 Chronic obstructive pulmonary disease, unspecified: Secondary | ICD-10-CM

## 2016-02-09 LAB — PULMONARY FUNCTION TEST
DL/VA % PRED: 68 %
DL/VA: 3.43 ml/min/mmHg/L
DLCO COR % PRED: 64 %
DLCO COR: 17.49 ml/min/mmHg
DLCO UNC % PRED: 63 %
DLCO unc: 17.05 ml/min/mmHg
FEF 25-75 PRE: 0.68 L/s
FEF 25-75 Post: 0.68 L/sec
FEF2575-%CHANGE-POST: 0 %
FEF2575-%PRED-POST: 27 %
FEF2575-%Pred-Pre: 27 %
FEV1-%CHANGE-POST: 0 %
FEV1-%Pred-Post: 53 %
FEV1-%Pred-Pre: 52 %
FEV1-PRE: 1.45 L
FEV1-Post: 1.46 L
FEV1FVC-%CHANGE-POST: 3 %
FEV1FVC-%Pred-Pre: 75 %
FEV6-%CHANGE-POST: -2 %
FEV6-%Pred-Post: 68 %
FEV6-%Pred-Pre: 70 %
FEV6-PRE: 2.41 L
FEV6-Post: 2.36 L
FEV6FVC-%Change-Post: 0 %
FEV6FVC-%PRED-PRE: 102 %
FEV6FVC-%Pred-Post: 102 %
FVC-%Change-Post: -2 %
FVC-%Pred-Post: 67 %
FVC-%Pred-Pre: 69 %
FVC-Post: 2.39 L
FVC-Pre: 2.45 L
POST FEV1/FVC RATIO: 61 %
Post FEV6/FVC ratio: 99 %
Pre FEV1/FVC ratio: 59 %
Pre FEV6/FVC Ratio: 98 %
RV % PRED: 174 %
RV: 3.64 L
TLC % pred: 114 %
TLC: 6.13 L

## 2016-02-09 NOTE — Telephone Encounter (Signed)
The results of the low-dose CT scan was called to Robin Rivera. I explained to her that her scan was read as a Lung RADS 2: nodules that are benign in appearance and behavior with a very low likelihood of becoming a clinically active cancer due to size or lack of growth. Recommendation per radiology is for a repeat LDCT in 12 months. I also reviewed with her that the scan revealed pulmonary artery enlargement including significantly enlarged left pulmonary artery indicative of pulmonary arterial hypertension. I also explained to her that there was esophageal air-fluid level suggesting dysmotility or reflux in her esophagus, and aortic atherosclerosis. Due to the incidental findings on the scan, she was scheduled to see Dr. Chase Caller  02/04/2016 for clinical follow-up . She verbalized understanding of all of the above. She understands that we will place the order for a follow-up screening scan in August 2018 as was as the recommendation per radiology. She had no further questions upon completing the call, but has my contact information in the event she has any further questions.

## 2016-02-11 LAB — ALPHA-1 ANTITRYPSIN PHENOTYPE: A1 ANTITRYPSIN: 163 mg/dL (ref 83–199)

## 2016-02-15 ENCOUNTER — Encounter: Payer: Self-pay | Admitting: Adult Health

## 2016-02-15 ENCOUNTER — Ambulatory Visit (INDEPENDENT_AMBULATORY_CARE_PROVIDER_SITE_OTHER): Payer: 59 | Admitting: Adult Health

## 2016-02-15 VITALS — BP 114/70 | HR 79 | Temp 98.2°F | Ht 65.0 in | Wt 98.0 lb

## 2016-02-15 DIAGNOSIS — Z23 Encounter for immunization: Secondary | ICD-10-CM

## 2016-02-15 DIAGNOSIS — H5789 Other specified disorders of eye and adnexa: Secondary | ICD-10-CM

## 2016-02-15 DIAGNOSIS — H578 Other specified disorders of eye and adnexa: Secondary | ICD-10-CM | POA: Diagnosis not present

## 2016-02-15 DIAGNOSIS — F17200 Nicotine dependence, unspecified, uncomplicated: Secondary | ICD-10-CM | POA: Diagnosis not present

## 2016-02-15 DIAGNOSIS — J449 Chronic obstructive pulmonary disease, unspecified: Secondary | ICD-10-CM

## 2016-02-15 MED ORDER — ALBUTEROL SULFATE HFA 108 (90 BASE) MCG/ACT IN AERS: 2.0000 | INHALATION_SPRAY | Freq: Four times a day (QID) | RESPIRATORY_TRACT | 5 refills | Status: DC | PRN

## 2016-02-15 MED ORDER — TIOTROPIUM BROMIDE-OLODATEROL 2.5-2.5 MCG/ACT IN AERS: 2.0000 | INHALATION_SPRAY | Freq: Every day | RESPIRATORY_TRACT | 5 refills | Status: DC

## 2016-02-15 MED FILL — VENTOLIN HFA 90 MCG INHALER: 108 (90 BAS | 25 days supply | Qty: 18 | Fill #0

## 2016-02-15 MED FILL — STIOLTO RESPIMAT INHAL SPRY: 2.5-2.5 | 30 days supply | Qty: 4 | Fill #0

## 2016-02-15 NOTE — Patient Instructions (Signed)
Begin Stiolto 2 puffs daily .  Pneumovax today .  See eye doctor as discussed .  Warm compresses to eye lid.  follow up Dr. Halford Chessman  In 2-3 months and As needed

## 2016-02-15 NOTE — Assessment & Plan Note (Signed)
Smoking cessation  Discussed nicotine patches and classes

## 2016-02-15 NOTE — Addendum Note (Signed)
Addended by: Osa Craver on: 02/15/2016 03:56 PM   Modules accepted: Orders

## 2016-02-15 NOTE — Assessment & Plan Note (Addendum)
Moderate to severe COPD with significant decline since 2010. Patient advised on smoking cessation. Begin Stiolto .  ? Pulmonary HTN seen on screening CT chest - she is seeing cardiology next month due Coronary calfification was seen .  May need echo to evaluate. Cont to follow   Plan  Patient Instructions  Begin Stiolto 2 puffs daily .  Pneumovax today .  See eye doctor as discussed .  Warm compresses to eye lid.  follow up Dr. Halford Chessman  In 2-3 months and As needed

## 2016-02-15 NOTE — Progress Notes (Signed)
Subjective:    Patient ID: Robin Rivera, female    DOB: 09-29-1955, 60 y.o.   MRN: [id number]  HPI 60 year old female, active smoker followed for COPD  02/15/2016 follow-up: COPD  Returns for a two-week follow-up. It was recently had a lung cancer screening CT that showed possible pulmonary hypertension. Set up for pulmonary function test that showed moderate to severe COPD with an FEV1 at 53%, ratio 61, FVC 67%, no significant bronchodilator response. DLCO 63% . This is significantly decreased from previous FEV1 at 86% in 2010. Alpha-1 testing was normal. She was referred to cardiology. She has an appointment next month. Discussed smoking cessation . She is going to the quit smoking classes.  Previously on Symbicort in the past. However, has not been on for greater than one year.  Patient says that she's had some right eye irritation the last 1-2 days. Denies any known injury or visual changes. No discharge..  Past Medical History:  Diagnosis Date  . Cough   . Rhinitis   . Tobacco abuse    Current Outpatient Prescriptions on File Prior to Visit  Medication Sig Dispense Refill  . albuterol (PROVENTIL HFA;VENTOLIN HFA) 108 (90 BASE) MCG/ACT inhaler Inhale 2 puffs into the lungs every 6 (six) hours as needed.    . budesonide-formoterol (SYMBICORT) 160-4.5 MCG/ACT inhaler Inhale 2 puffs into the lungs 2 (two) times daily. 1 Inhaler 5  . buPROPion (ZYBAN) 150 MG 12 hr tablet Take 1 tablet (150 mg total) by mouth 2 (two) times daily. (Patient not taking: Reported on 02/15/2016) 60 tablet 3  . fluticasone (FLONASE) 50 MCG/ACT nasal spray Place 2 sprays into the nose daily. (Patient not taking: Reported on 02/15/2016) 16 g 0   No current facility-administered medications on file prior to visit.      Review of Systems Constitutional:   No  weight loss, night sweats,  Fevers, chills, fatigue, or  lassitude.  HEENT:   No headaches,  Difficulty swallowing,  Tooth/dental problems, or  Sore  throat,                No sneezing, itching, ear ache, nasal congestion, post nasal drip,   CV:  No chest pain,  Orthopnea, PND, swelling in lower extremities, anasarca, dizziness, palpitations, syncope.   GI  No heartburn, indigestion, abdominal pain, nausea, vomiting, diarrhea, change in bowel habits, loss of appetite, bloody stools.   Resp:   No chest wall deformity  Skin: no rash or lesions.  GU: no dysuria, change in color of urine, no urgency or frequency.  No flank pain, no hematuria   MS:  No joint pain or swelling.  No decreased range of motion.  No back pain.  Psych:  No change in mood or affect. No depression or anxiety.  No memory loss.         Objective:   Physical Exam  Vitals:   02/15/16 1521  BP: 114/70  Pulse: 79  Temp: 98.2 F (36.8 C)  TempSrc: Oral  SpO2: 99%  Weight: 98 lb (44.5 kg)  Height: 5\' 5"  (1.651 m)   GEN: A/Ox3; pleasant , NAD, thin    HEENT:  [place]/AT,  EACs-clear, TMs-wnl, NOSE-clear, THROAT-clear, no lesions, no postnasal drip or exudate noted.  Along right upper lid w/ mild swelling/redness, no induration noted. Mild conjunctival injection  Along inner to mid eye lid with ?nevus .   NECK:  Supple w/ fair ROM; no JVD; normal carotid impulses w/o bruits; no thyromegaly or nodules  palpated; no lymphadenopathy.    RESP  Clear  P & A; w/o, wheezes/ rales/ or rhonchi. no accessory muscle use, no dullness to percussion  CARD:  RRR, no m/r/g  , no peripheral edema, pulses intact, no cyanosis or clubbing.  GI:   Soft & nt; nml bowel sounds; no organomegaly or masses detected.   Musco: Warm bil, no deformities or joint swelling noted.   Neuro: alert, no focal deficits noted.    Skin: Warm, no lesions or rashes  Jocelyn Lowery NP-C  [facility]  02/15/2016

## 2016-02-15 NOTE — Assessment & Plan Note (Signed)
Right eye irritation w/ upper lid swelling ? Stye developing .  Also she has unusal looking nevus aloing mid upper lid ? Etiology  Advised she need to see opthalmology for evaluation.

## 2016-02-16 NOTE — Progress Notes (Signed)
Called and spoke to pt. Informed her of the results per MR. Pt verbalized understanding and denied any further questions or concerns at this time.  

## 2016-02-16 NOTE — Progress Notes (Signed)
Reviewed and agree with assessment/plan.  Chesley Mires, MD [facility]/Critical Care 02/16/2016, 8:59 AM Pager:  [phone]

## 2016-02-25 NOTE — Progress Notes (Signed)
pft  

## 2016-03-10 NOTE — Progress Notes (Signed)
Cardiology Office Note   Date:  03/11/2016   ID:  Robin Rivera, DOB September 18, 1955, MRN [id number]  PCP:  No PCP Per Patient  Cardiologist:   Minus Breeding, MD  Referring:  Rexene Edison, NP  Chief Complaint  Patient presents with  . Coronary Calcium     History of Present Illness: Robin Rivera is a 60 y.o. female who presents for follow of coronary calcium that was seen on chest CT.  She has no past cardiac history. However, she does have COPD and long smoking history. She reports when she was 10 having heart catheterization for a murmur but she had no abnormalities noted.  She doesn't exercise routinely. She does walking at work. With this she does have some shortness of breath which has been slowly progressive. There was a question of pulmonary hypertension was noted on a CT scan. She doesn't however have any chest pressure, neck or arm discomfort. She doesn't describe any palpitations, presyncope or syncope. She's not having any PND or orthopnea.   Past Medical History:  Diagnosis Date  . Rhinitis   . Tobacco abuse     Past Surgical History:  Procedure Laterality Date  . TONSILLECTOMY    . TOTAL ABDOMINAL HYSTERECTOMY       Current Outpatient Prescriptions  Medication Sig Dispense Refill  . albuterol (PROVENTIL HFA;VENTOLIN HFA) 108 (90 Base) MCG/ACT inhaler Inhale 2 puffs into the lungs every 6 (six) hours as needed. 1 Inhaler 5  . buPROPion (ZYBAN) 150 MG 12 hr tablet Take 1 tablet (150 mg total) by mouth 2 (two) times daily. 60 tablet 3  . fluticasone (FLONASE) 50 MCG/ACT nasal spray Place 2 sprays into the nose daily. 16 g 0  . Tiotropium Bromide-Olodaterol (STIOLTO RESPIMAT) 2.5-2.5 MCG/ACT AERS Inhale into the lungs.     No current facility-administered medications for this visit.     Allergies:   Review of patient's allergies indicates no known allergies.    Social History:  The patient  reports that she has been smoking Cigarettes.  She has a 34.50  pack-year smoking history. She has never used smokeless tobacco. She reports that she does not drink alcohol or use drugs.   Family History:  The patient's family history is not on file. She was adopted.    ROS:  Please see the history of present illness.   Otherwise, review of systems are positive for none.   All other systems are reviewed and negative.    PHYSICAL EXAM: VS:  BP 108/70   Pulse 74   Ht 5\' 5"  (1.651 m)   Wt 102 lb (46.3 kg)   BMI 16.97 kg/m  , BMI Body mass index is 16.97 kg/m. GENERAL:  Well appearing HEENT:  Pupils equal round and reactive, fundi not visualized, oral mucosa unremarkable NECK:  No jugular venous distention, waveform within normal limits, carotid upstroke brisk and symmetric, no bruits, no thyromegaly LYMPHATICS:  No cervical, inguinal adenopathy LUNGS:  Clear to auscultation bilaterally BACK:  No CVA tenderness CHEST:  Unremarkable HEART:  PMI not displaced or sustained,S1 and S2 within normal limits, no S3, no S4, no clicks, no rubs, no murmurs ABD:  Flat, positive bowel sounds normal in frequency in pitch, no bruits, no rebound, no guarding, no midline pulsatile mass, no hepatomegaly, no splenomegaly EXT:  2 plus pulses throughout, no edema, no cyanosis no clubbing SKIN:  No rashes no nodules NEURO:  Cranial nerves II through XII grossly intact, motor grossly intact throughout  PSYCH:  Cognitively intact, oriented to person place and time    EKG:  EKG is ordered today. The ekg ordered today demonstrates sinus rhythm, rate 74, axis within normal limits, intervals within normal limits, no acute ST-T wave changes.   Recent Labs: No results found for requested labs within last 8760 hours.    Lipid Panel No results found for: CHOL, TRIG, HDL, CHOLHDL, VLDL, LDLCALC, LDLDIRECT    Wt Readings from Last 3 Encounters:  03/11/16 102 lb (46.3 kg)  02/15/16 98 lb (44.5 kg)  02/04/16 100 lb 3.2 oz (45.5 kg)      Other studies  Reviewed: Additional studies/ records that were reviewed today include: CT. Review of the above records demonstrates:  Please see elsewhere in the note.     ASSESSMENT AND PLAN:  CORONARY CALCIUM:  The patient has no symptoms. However, she does have significant risk factors, she has acute on chronic shortness of breath.  I will bring the patient back for a POET (Plain Old Exercise Test). This will allow me to screen for obstructive coronary disease, risk stratify and very importantly provide a prescription for exercise.  TOBACCO:  She is down to 5 cigarettes a day and plans to quit discussed getting a quit date.  RISK REDUCTION:   I will check a lipid profile when she returns for a stress test.  Current medicines are reviewed at length with the patient today.  The patient does not have concerns regarding medicines.  The following changes have been made:  no change  Labs/ tests ordered today include:   Orders Placed This Encounter  Procedures  . Lipid panel  . EXERCISE TOLERANCE TEST  . EKG 12-Lead     Disposition:   FU with me as needed.     Signed, Minus Breeding, MD  03/11/2016 5:25 PM    [facility]

## 2016-03-11 ENCOUNTER — Encounter: Payer: Self-pay | Admitting: Cardiology

## 2016-03-11 ENCOUNTER — Ambulatory Visit (INDEPENDENT_AMBULATORY_CARE_PROVIDER_SITE_OTHER): Payer: 59 | Admitting: Cardiology

## 2016-03-11 VITALS — BP 108/70 | HR 74 | Ht 65.0 in | Wt 102.0 lb

## 2016-03-11 DIAGNOSIS — R0602 Shortness of breath: Secondary | ICD-10-CM | POA: Diagnosis not present

## 2016-03-11 DIAGNOSIS — I2510 Atherosclerotic heart disease of native coronary artery without angina pectoris: Secondary | ICD-10-CM

## 2016-03-11 MED ORDER — BUPROPION HCL ER (SMOKING DET) 150 MG PO TB12: 150.0000 mg | ORAL_TABLET | Freq: Two times a day (BID) | ORAL | 3 refills | Status: DC

## 2016-03-11 MED FILL — BUPROPION SR 150 MG TABLET: 150 | 30 days supply | Qty: 60 | Fill #0

## 2016-03-11 NOTE — Patient Instructions (Addendum)
Medication Instructions:  Continue current medications  Labwork: Fasting Lipids  Testing/Procedures: Your physician has requested that you have an exercise tolerance test. For further information please visit [email or web address]. Please also follow instruction sheet, as given.  Follow-Up: Your physician recommends that you schedule a follow-up appointment in: As Needed    Any Other Special Instructions Will Be Listed Below (If Applicable).   If you need a refill on your cardiac medications before your next appointment, please call your pharmacy.

## 2016-03-14 ENCOUNTER — Encounter: Payer: Self-pay | Admitting: Cardiology

## 2016-03-18 ENCOUNTER — Telehealth (HOSPITAL_COMMUNITY): Payer: Self-pay

## 2016-03-18 NOTE — Telephone Encounter (Signed)
Encounter complete. 

## 2016-03-21 DIAGNOSIS — I2510 Atherosclerotic heart disease of native coronary artery without angina pectoris: Secondary | ICD-10-CM | POA: Diagnosis not present

## 2016-03-21 LAB — LIPID PANEL
Cholesterol: 185 mg/dL (ref 125–200)
HDL: 61 mg/dL (ref >=46)
LDL Cholesterol: 98 mg/dL (ref <130)
Total CHOL/HDL Ratio: 3 Ratio (ref <=5.0)
Triglycerides: 130 mg/dL (ref <150)
VLDL: 26 mg/dL (ref <30)

## 2016-03-23 ENCOUNTER — Ambulatory Visit (HOSPITAL_COMMUNITY)
Admission: RE | Admit: 2016-03-23 | Discharge: 2016-03-23 | Disposition: A | Discharge status: Home / Self Care | Payer: Self-pay | Source: Ambulatory Visit | Attending: Cardiovascular Disease | Admitting: Cardiovascular Disease

## 2016-03-23 DIAGNOSIS — I2510 Atherosclerotic heart disease of native coronary artery without angina pectoris: Secondary | ICD-10-CM

## 2016-03-23 DIAGNOSIS — R0602 Shortness of breath: Secondary | ICD-10-CM | POA: Diagnosis not present

## 2016-03-23 LAB — EXERCISE TOLERANCE TEST
CHL CUP MPHR: 160 {beats}/min
CHL CUP RESTING HR STRESS: 67 {beats}/min
CSEPHR: 93 %
CSEPPHR: 150 {beats}/min
Estimated workload: 9 METS
Exercise duration (min): 7 min
Exercise duration (sec): 20 s
RPE: 17

## 2016-03-29 ENCOUNTER — Encounter: Payer: Self-pay | Admitting: Family

## 2016-03-29 ENCOUNTER — Ambulatory Visit (INDEPENDENT_AMBULATORY_CARE_PROVIDER_SITE_OTHER): Payer: 59 | Admitting: Family

## 2016-03-29 VITALS — BP 124/74 | HR 73 | Temp 97.6°F | Resp 16 | Ht 65.0 in | Wt 101.8 lb

## 2016-03-29 DIAGNOSIS — K219 Gastro-esophageal reflux disease without esophagitis: Secondary | ICD-10-CM | POA: Insufficient documentation

## 2016-03-29 DIAGNOSIS — F17200 Nicotine dependence, unspecified, uncomplicated: Secondary | ICD-10-CM | POA: Diagnosis not present

## 2016-03-29 DIAGNOSIS — K2100 Gastro-esophageal reflux disease with esophagitis, without bleeding: Secondary | ICD-10-CM

## 2016-03-29 DIAGNOSIS — J449 Chronic obstructive pulmonary disease, unspecified: Secondary | ICD-10-CM | POA: Diagnosis not present

## 2016-03-29 MED ORDER — OMEPRAZOLE 40 MG PO CPDR: 40.0000 mg | DELAYED_RELEASE_CAPSULE | Freq: Every day | ORAL | 0 refills | Status: DC

## 2016-03-29 MED FILL — OMEPRAZOLE DR 40 MG CAPSULE: 40 | 90 days supply | Qty: 90 | Fill #0

## 2016-03-29 NOTE — Assessment & Plan Note (Signed)
Gastroesophageal reflux appears labile with over-the-counter medication regimen. Start prescription omeprazole. Add a H2 blocker if symptoms do not improve. Information on GERD diet provided and after visit summary. Has gastroenterology follow-up scheduled. Continue to monitor.

## 2016-03-29 NOTE — Assessment & Plan Note (Signed)
Recently started on Zyban to help with tobacco cessation is down to 5 cigarettes per day. No adverse side effects. Continue current dosage of Zyban.

## 2016-03-29 NOTE — Patient Instructions (Signed)
Thank you for choosing [facility].  SUMMARY AND INSTRUCTIONS:  Medication:  Please start taking the omeprazole as prescribed.  Consider additional Pepcid (famotidine) or Zantac (raniditinde) over the counter if symptoms remain uncontrolled.  Your prescription(s) have been submitted to your pharmacy or been printed and provided for you. Please take as directed and contact our office if you believe you are having problem(s) with the medication(s) or have any questions.  Follow up:  If your symptoms worsen or fail to improve, please contact our office for further instruction, or in case of emergency go directly to the emergency room at the closest medical facility.    Food Choices for Gastroesophageal Reflux Disease, Adult When you have gastroesophageal reflux disease (GERD), the foods you eat and your eating habits are very important. Choosing the right foods can help ease the discomfort of GERD. WHAT GENERAL GUIDELINES DO I NEED TO FOLLOW?  Choose fruits, vegetables, whole grains, low-fat dairy products, and low-fat meat, fish, and poultry.  Limit fats such as oils, salad dressings, butter, nuts, and avocado.  Keep a food diary to identify foods that cause symptoms.  Avoid foods that cause reflux. These may be different for different people.  Eat frequent small meals instead of three large meals each day.  Eat your meals slowly, in a relaxed setting.  Limit fried foods.  Cook foods using methods other than frying.  Avoid drinking alcohol.  Avoid drinking large amounts of liquids with your meals.  Avoid bending over or lying down until 2-3 hours after eating. WHAT FOODS ARE NOT RECOMMENDED? The following are some foods and drinks that may worsen your symptoms: Vegetables Tomatoes. Tomato juice. Tomato and spaghetti sauce. Chili peppers. Onion and garlic. Horseradish. Fruits Oranges, grapefruit, and lemon (fruit and juice). Meats High-fat meats, fish, and  poultry. This includes hot dogs, ribs, ham, sausage, salami, and bacon. Dairy Whole milk and chocolate milk. Sour cream. Cream. Butter. Ice cream. Cream cheese.  Beverages Coffee and tea, with or without caffeine. Carbonated beverages or energy drinks. Condiments Hot sauce. Barbecue sauce.  Sweets/Desserts Chocolate and cocoa. Donuts. Peppermint and spearmint. Fats and Oils High-fat foods, including [place] fries and potato chips. Other Vinegar. Strong spices, such as black pepper, white pepper, red pepper, cayenne, curry powder, cloves, ginger, and chili powder. The items listed above may not be a complete list of foods and beverages to avoid. Contact your dietitian for more information.   This information is not intended to replace advice given to you by your health care provider. Make sure you discuss any questions you have with your health care provider.   Document Released: 06/06/2005 Document Revised: 06/27/2014 Document Reviewed: 04/10/2013 Elsevier Interactive Patient Education [employer].

## 2016-03-29 NOTE — Progress Notes (Signed)
Subjective:    Patient ID: Robin Rivera, female    DOB: 1955-10-15, 60 y.o.   MRN: [id number]  Chief Complaint  Patient presents with  . Establish Care    HPI:  Robin Rivera is a 60 y.o. female who  has a past medical history of COPD (chronic obstructive pulmonary disease) ([place]); GERD (gastroesophageal reflux disease); Rhinitis; and Tobacco abuse. and presents today for an office visit to establish care.   1.) Tobacco use - Recently started on Zyban for tobacco cessation. Reports taking the medication as prescribed and denies adverse side effects. Goal is to currently quit smoking and currently smoking about 5 cigarettes per day.  2.) COPD - Previously diagnosed with COPD and currently maintained on Stiolto and albuterol. Most recent PFT shows moderate to severe progressing COPD. Reports taking the medications as prescribed and denies adverse side effects. No current cough, fevers, or shortness of breath. Does use her albuterol inhaler about once per day.  3.) GERD - Currently maintained on OTC omeprazole. Reports taking about 4 of them at a time. Symptoms are not very well controlled with the omeprazole and is aggravated by lying down. She has been awakened from sleep secondary to her symptoms. She does have appointment with gastroenterology to follow up in the next month.    No Known Allergies    Outpatient Medications Prior to Visit  Medication Sig Dispense Refill  . albuterol (PROVENTIL HFA;VENTOLIN HFA) 108 (90 Base) MCG/ACT inhaler Inhale 2 puffs into the lungs every 6 (six) hours as needed. 1 Inhaler 5  . buPROPion (ZYBAN) 150 MG 12 hr tablet Take 1 tablet (150 mg total) by mouth 2 (two) times daily. 60 tablet 3  . fluticasone (FLONASE) 50 MCG/ACT nasal spray Place 2 sprays into the nose daily. 16 g 0  . Tiotropium Bromide-Olodaterol (STIOLTO RESPIMAT) 2.5-2.5 MCG/ACT AERS Inhale into the lungs.     No facility-administered medications prior to visit.       Past  Surgical History:  Procedure Laterality Date  . TONSILLECTOMY    . TOTAL ABDOMINAL HYSTERECTOMY     Partial hysterectomy      Past Medical History:  Diagnosis Date  . COPD (chronic obstructive pulmonary disease) ([place])   . GERD (gastroesophageal reflux disease)   . Rhinitis   . Tobacco abuse       Review of Systems  Constitutional: Negative for chills and fever.  Respiratory: Negative for chest tightness, shortness of breath and wheezing.   Cardiovascular: Negative for chest pain, palpitations and leg swelling.  Gastrointestinal: Negative for abdominal pain, blood in stool, constipation, diarrhea, nausea and vomiting.      Objective:    BP 124/74 (BP Location: Left Arm, Patient Position: Sitting, Cuff Size: Normal)   Pulse 73   Temp 97.6 F (36.4 C) (Oral)   Resp 16   Ht 5\' 5"  (1.651 m)   Wt 101 lb 12.8 oz (46.2 kg)   SpO2 94%   BMI 16.94 kg/m  Nursing note and vital signs reviewed.  Physical Exam  Constitutional: She is oriented to person, place, and time. She appears well-developed and well-nourished. No distress.  Cardiovascular: Normal rate, regular rhythm, normal heart sounds and intact distal pulses.   Pulmonary/Chest: Effort normal and breath sounds normal.  Neurological: She is alert and oriented to person, place, and time.  Skin: Skin is warm and dry.  Psychiatric: She has a normal mood and affect. Her behavior is normal. Judgment and thought content normal.  Assessment & Plan:   Problem List Items Addressed This Visit      Respiratory   Chronic obstructive pulmonary disease ([place])    Most recent PFT shows moderate to severe COPD with progression. Maintained on medications with no current symptoms. Continue current dosage of albuterol and Stiolto. Follow up and changes per pulmonology. Continue to monitor.         Digestive   GERD (gastroesophageal reflux disease) - Primary    Gastroesophageal reflux appears labile with over-the-counter  medication regimen. Start prescription omeprazole. Add a H2 blocker if symptoms do not improve. Information on GERD diet provided and after visit summary. Has gastroenterology follow-up scheduled. Continue to monitor.      Relevant Medications   omeprazole (PRILOSEC) 40 MG capsule     Other   TOBACCO ABUSE    Recently started on Zyban to help with tobacco cessation is down to 5 cigarettes per day. No adverse side effects. Continue current dosage of Zyban.       Other Visit Diagnoses   None.      I am having Ms. Dimalanta start on omeprazole. I am also having her maintain her fluticasone, Tiotropium Bromide-Olodaterol, albuterol, and buPROPion.   Meds ordered this encounter  Medications  . omeprazole (PRILOSEC) 40 MG capsule    Sig: Take 1 capsule (40 mg total) by mouth daily.    Dispense:  90 capsule    Refill:  0    Order Specific Question:   Supervising Provider    Answer:   Pricilla Holm A [id number]     Follow-up: Return in about 3 months (around 06/29/2016).  Mauricio Po, FNP

## 2016-03-29 NOTE — Assessment & Plan Note (Signed)
Most recent PFT shows moderate to severe COPD with progression. Maintained on medications with no current symptoms. Continue current dosage of albuterol and Stiolto. Follow up and changes per pulmonology. Continue to monitor.

## 2016-04-12 ENCOUNTER — Ambulatory Visit (INDEPENDENT_AMBULATORY_CARE_PROVIDER_SITE_OTHER): Payer: 59 | Admitting: Internal Medicine

## 2016-04-12 ENCOUNTER — Encounter: Payer: Self-pay | Admitting: Internal Medicine

## 2016-04-12 VITALS — BP 118/68 | HR 72 | Ht 65.0 in | Wt 102.4 lb

## 2016-04-12 DIAGNOSIS — R938 Abnormal findings on diagnostic imaging of other specified body structures: Secondary | ICD-10-CM | POA: Diagnosis not present

## 2016-04-12 DIAGNOSIS — R1310 Dysphagia, unspecified: Secondary | ICD-10-CM | POA: Diagnosis not present

## 2016-04-12 DIAGNOSIS — K219 Gastro-esophageal reflux disease without esophagitis: Secondary | ICD-10-CM

## 2016-04-12 DIAGNOSIS — R9389 Abnormal findings on diagnostic imaging of other specified body structures: Secondary | ICD-10-CM

## 2016-04-12 NOTE — Progress Notes (Signed)
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 Chase Caller regarding abnormal CT scan of the chest and dysphagia. Patient has COPD secondary to long-standing tobacco abuse. She reports to me many year history of problems with indigestion and heartburn. Worsening symptoms in recent months. Also new onset dysphagia over the past several months. Solids greater than liquids. Proton PPI for the first time about 2 weeks ago with some improvement in pyrosis. Other symptoms persist. No weight loss. CT scan of the chest demonstrated fluid in the esophagus. No obvious mass. Patient denies weight loss. No prior history of GI problems requiring GI evaluation or procedures. She ambulates without shortness of breath and has never required oxygen therapy  REVIEW OF SYSTEMS:  All non-GI ROS negative upon comprehensive review  Past Medical History:  Diagnosis Date  . COPD (chronic obstructive pulmonary disease) ([place])   . GERD (gastroesophageal reflux disease)   . Rhinitis   . Tobacco abuse     Past Surgical History:  Procedure Laterality Date  . TONSILLECTOMY    . TOTAL ABDOMINAL HYSTERECTOMY     Partial hysterectomy    Social History Robin Rivera  reports that she has been smoking Cigarettes.  She has a 34.50 pack-year smoking history. She has never used smokeless tobacco. She reports that she does not drink alcohol or use drugs.  family history includes Colon cancer in her maternal aunt. She was adopted.  No Known Allergies     PHYSICAL EXAMINATION: Vital signs: BP 118/68   Pulse 72   Ht 5\' 5"  (1.651 m)   Wt 102 lb 6 oz (46.4 kg)   BMI 17.04 kg/m   Constitutional: Thin, but generally well-appearing, no acute distress Psychiatric: alert and oriented x3, cooperative Eyes: extraocular movements intact, anicteric, conjunctiva pink Mouth: oral pharynx moist, no lesions Neck: supple without  thyromegaly Lymph: no supraclavicular lymphadenopathy Cardiovascular: heart regular rate and rhythm, no murmur Lungs: clear to auscultation bilaterally Abdomen: soft, nontender, nondistended, no obvious ascites, no peritoneal signs, normal bowel sounds, no organomegaly Rectal: Omitted Extremities: no clubbing cyanosis or lower extremity edema bilaterally Skin: no lesions on visible extremities Neuro: No focal deficits. Cranial nerves intact  ASSESSMENT:  #1. Chronic GERD. Deteriorated recently. Slight improvement on PPI #2. New onset dysphagia to solids greater than liquids. Rule out peptic stricture. Rule out neoplasm #3. Abnormal CT scan with fluid level in the esophagus. Rule out stricture. Rule out neoplasm  PLAN:  #1. reflux precautions #2. Stop smoking #3. Continue PPI #4. Schedule upper endoscopy with possible esophageal dilation.The nature of the procedure, as well as the risks, benefits, and alternatives were carefully and thoroughly reviewed with the patient. Ample time for discussion and questions allowed. The patient understood, was satisfied, and agreed to proceed.  A copy of this consultation note has been sent to Dr. Chase Caller

## 2016-04-12 NOTE — Patient Instructions (Signed)

## 2016-04-14 ENCOUNTER — Encounter: Payer: 59 | Admitting: Family

## 2016-04-19 ENCOUNTER — Encounter: Payer: Self-pay | Admitting: Pulmonary Disease

## 2016-04-19 ENCOUNTER — Ambulatory Visit (INDEPENDENT_AMBULATORY_CARE_PROVIDER_SITE_OTHER): Payer: 59 | Admitting: Pulmonary Disease

## 2016-04-19 VITALS — BP 104/70 | HR 76 | Ht 65.0 in | Wt 103.0 lb

## 2016-04-19 DIAGNOSIS — F17200 Nicotine dependence, unspecified, uncomplicated: Secondary | ICD-10-CM | POA: Diagnosis not present

## 2016-04-19 DIAGNOSIS — J432 Centrilobular emphysema: Secondary | ICD-10-CM

## 2016-04-19 NOTE — Patient Instructions (Signed)
Follow up in 2 months with Dr. Malek Skog or Nurse Practitioner 

## 2016-04-19 NOTE — Progress Notes (Signed)
Current Outpatient Prescriptions on File Prior to Visit  Medication Sig  . albuterol (PROVENTIL HFA;VENTOLIN HFA) 108 (90 Base) MCG/ACT inhaler Inhale 2 puffs into the lungs every 6 (six) hours as needed.  Marland Kitchen buPROPion (ZYBAN) 150 MG 12 hr tablet Take 1 tablet (150 mg total) by mouth 2 (two) times daily.  . fluticasone (FLONASE) 50 MCG/ACT nasal spray Place 2 sprays into the nose daily.  Marland Kitchen omeprazole (PRILOSEC) 40 MG capsule Take 1 capsule (40 mg total) by mouth daily.  . Tiotropium Bromide-Olodaterol (STIOLTO RESPIMAT) 2.5-2.5 MCG/ACT AERS Inhale into the lungs.   No current facility-administered medications on file prior to visit.      Chief Complaint  Patient presents with  . Follow-up    2 month COPD follow up - reports breathing is 40% improved since last ov.  doing well on the Stiolto     Pulmonary tests Spirometry 01/09/09 >> FEV1 2.20 (86%), FEV1% 70 Low dose CT chest 02/03/16 >> moderate centrilobular emphysema, scattered nodules PFT 02/09/16 >> FEV1 1.46 (53%), FEV1% 61, TLC 6.13 (114%), DLCO 63%  Past medical history GERD, Rhinitis  Past surgical history, Family history, Social history, Allergies all reviewed.  Vital Signs BP 104/70 (BP Location: Left Arm, Cuff Size: Normal)   Pulse 76   Ht 5\' 5"  (1.651 m)   Wt 103 lb (46.7 kg)   SpO2 97%   BMI 17.14 kg/m   History of Present Illness Robin Rivera is a 60 y.o. female smoker with COPD.  Her breathing has improved since last visit.  She is down to 4 cigarettes per day.  Bupropion has helped.  She had low dose CT scan >> found to have GERD and has f/u with Dr. Henrene Pastor with GI.  Not having cough, wheeze, sputum, fever, or chest pain.  Physical Exam  General - No distress ENT - No sinus tenderness, no oral exudate, no LAN Cardiac - s1s2 regular, no murmur Chest - No wheeze/rales/dullness Back - No focal tenderness Abd - Soft, non-tender Ext - No edema Neuro - Normal strength Skin - No rashes Psych - normal  mood, and behavior   Assessment/Plan  COPD with emphysema. - continue stiolto and prn albuterol  Tobacco abuse. - encouraged her to continue her smoking cessation efforts - will re-assess how long she needs to stay on bupropion at next visit   Patient Instructions  Follow up in 2 months with Dr. Halford Chessman or Nurse Practitioner    Chesley Mires, MD [place] Pager:  [phone] 04/19/2016, 3:58 PM

## 2016-04-21 ENCOUNTER — Encounter: Payer: Self-pay | Admitting: Internal Medicine

## 2016-04-21 ENCOUNTER — Ambulatory Visit (AMBULATORY_SURGERY_CENTER): Payer: 59 | Admitting: Internal Medicine

## 2016-04-21 VITALS — BP 105/67 | HR 71 | Temp 98.6°F | Resp 14 | Ht 65.0 in | Wt 102.0 lb

## 2016-04-21 DIAGNOSIS — K222 Esophageal obstruction: Secondary | ICD-10-CM

## 2016-04-21 DIAGNOSIS — R1310 Dysphagia, unspecified: Secondary | ICD-10-CM | POA: Diagnosis present

## 2016-04-21 DIAGNOSIS — K219 Gastro-esophageal reflux disease without esophagitis: Secondary | ICD-10-CM | POA: Diagnosis not present

## 2016-04-21 DIAGNOSIS — J449 Chronic obstructive pulmonary disease, unspecified: Secondary | ICD-10-CM | POA: Diagnosis not present

## 2016-04-21 MED ORDER — SODIUM CHLORIDE 0.9 % IV SOLN: 500.0000 mL | INTRAVENOUS | Status: DC

## 2016-04-21 NOTE — Op Note (Signed)
[facility] Patient Name: Robin Rivera Procedure Date: 04/21/2016 3:42 PM MRN: [id number] Endoscopist: Docia Chuck. Henrene Pastor , MD Age: 60 Referring MD:  Date of Birth: 03/27/1956 Gender: Female Account #: [id number] Procedure:                Upper GI endoscopy, with Venia Minks dilation-54 [place] Indications:              Dysphagia, Suspected esophageal reflux, Abnormal CT                            of the GI tract Medicines:                Monitored Anesthesia Care Procedure:                Pre-Anesthesia Assessment:                           - Prior to the procedure, a History and Physical                            was performed, and patient medications and                            allergies were reviewed. The patient's tolerance of                            previous anesthesia was also reviewed. The risks                            and benefits of the procedure and the sedation                            options and risks were discussed with the patient.                            All questions were answered, and informed consent                            was obtained. Prior Anticoagulants: The patient has                            taken no previous anticoagulant or antiplatelet                            agents. ASA Grade Assessment: II - A patient with                            mild systemic disease. After reviewing the risks                            and benefits, the patient was deemed in                            satisfactory condition to undergo the procedure.  After obtaining informed consent, the endoscope was                            passed under direct vision. Throughout the                            procedure, the patient's blood pressure, pulse, and                            oxygen saturations were monitored continuously. The                            Model GIF-HQ190 [id number]) scope was introduced   through the mouth, and advanced to the second part                            of duodenum. The upper GI endoscopy was                            accomplished without difficulty. The patient                            tolerated the procedure well. Scope In: Scope Out: Findings:                 One mild benign-appearing, intrinsic stenosis was                            found. This measured 1.5 cm (inner diameter). The                            scope was withdrawn. Dilation was performed with a                            Maloney dilator with no resistance at 67 Fr.                           The exam of the esophagus was otherwise normal.                           The stomach was normal.                           The examined duodenum was normal.                           The cardia and gastric fundus were normal on                            retroflexion. Complications:            No immediate complications. Estimated Blood Loss:     Estimated blood loss: none. Impression:               - Benign-appearing esophageal stenosis. Dilated.                           -  GERD.                           - Otherwise normal EGD. Recommendation:           - Continue omeprazole (Prilosec) 40 mg daily.                           - Dilation diet.                           - Office follow-up with Dr. Henrene Pastor in about 8 weeks. Docia Chuck. Henrene Pastor, MD 04/21/2016 3:56:44 PM This report has been signed electronically.

## 2016-04-21 NOTE — Patient Instructions (Signed)
Impression/Recommendations:  GERD handout given to patient.  Continue Omeprazole (prilosec) 40 mg. Daily.  Dilation diet handout given to patient with review of instructions.  Follow up visit with Dr. Henrene Pastor in office in 8 weeks.  YOU HAD AN ENDOSCOPIC PROCEDURE TODAY AT [facility]:   Refer to the procedure report that was given to you for any specific questions about what was found during the examination.  If the procedure report does not answer your questions, please call your gastroenterologist to clarify.  If you requested that your care partner not be given the details of your procedure findings, then the procedure report has been included in a sealed envelope for you to review at your convenience later.  YOU SHOULD EXPECT: Some feelings of bloating in the abdomen. Passage of more gas than usual.  Walking can help get rid of the air that was put into your GI tract during the procedure and reduce the bloating. If you had a lower endoscopy (such as a colonoscopy or flexible sigmoidoscopy) you may notice spotting of blood in your stool or on the toilet paper. If you underwent a bowel prep for your procedure, you may not have a normal bowel movement for a few days.  Please Note:  You might notice some irritation and congestion in your nose or some drainage.  This is from the oxygen used during your procedure.  There is no need for concern and it should clear up in a day or so.  SYMPTOMS TO REPORT IMMEDIATELY:   Following upper endoscopy (EGD)  Vomiting of blood or coffee ground material  New chest pain or pain under the shoulder blades  Painful or persistently difficult swallowing  New shortness of breath  Fever of 100F or higher  Black, tarry-looking stools  For urgent or emergent issues, a gastroenterologist can be reached at any hour by calling [phone].   DIET:  We do recommend a small meal at first, but then you may proceed to your regular diet.  Drink  plenty of fluids but you should avoid alcoholic beverages for 24 hours.  ACTIVITY:  You should plan to take it easy for the rest of today and you should NOT DRIVE or use heavy machinery until tomorrow (because of the sedation medicines used during the test).    FOLLOW UP: Our staff will call the number listed on your records the next business day following your procedure to check on you and address any questions or concerns that you may have regarding the information given to you following your procedure. If we do not reach you, we will leave a message.  However, if you are feeling well and you are not experiencing any problems, there is no need to return our call.  We will assume that you have returned to your regular daily activities without incident.  If any biopsies were taken you will be contacted by phone or by letter within the next 1-3 weeks.  Please call us at [phone] if you have not heard about the biopsies in 3 weeks.    SIGNATURES/CONFIDENTIALITY: You and/or your care partner have signed paperwork which will be entered into your electronic medical record.  These signatures attest to the fact that that the information above on your After Visit Summary has been reviewed and is understood.  Full responsibility of the confidentiality of this discharge information lies with you and/or your care-partner.

## 2016-04-21 NOTE — Progress Notes (Signed)
Report to PACU, RN, vss, BBS= Clear.  

## 2016-04-22 ENCOUNTER — Telehealth: Payer: Self-pay | Admitting: *Deleted

## 2016-04-22 NOTE — Telephone Encounter (Signed)
  Follow up Call-  Call back number 04/21/2016  Post procedure Call Back phone  # [phone]  Permission to leave phone message Yes  Some recent data might be hidden     Patient questions:  Do you have a fever, pain , or abdominal swelling? No. Pain Score  0 *  Have you tolerated food without any problems? Yes.    Have you been able to return to your normal activities? Yes.    Do you have any questions about your discharge instructions: Diet   No. Medications  No. Follow up visit  No.  Do you have questions or concerns about your Care? No.  Actions: * If pain score is 4 or above: No action needed, pain <4.

## 2016-05-02 ENCOUNTER — Ambulatory Visit (INDEPENDENT_AMBULATORY_CARE_PROVIDER_SITE_OTHER): Payer: 59 | Admitting: Family

## 2016-05-02 ENCOUNTER — Other Ambulatory Visit (INDEPENDENT_AMBULATORY_CARE_PROVIDER_SITE_OTHER): Payer: 59

## 2016-05-02 ENCOUNTER — Encounter: Payer: Self-pay | Admitting: Family

## 2016-05-02 VITALS — BP 122/80 | HR 85 | Temp 98.5°F | Resp 16 | Ht 65.0 in | Wt 102.0 lb

## 2016-05-02 DIAGNOSIS — R636 Underweight: Secondary | ICD-10-CM

## 2016-05-02 DIAGNOSIS — F17200 Nicotine dependence, unspecified, uncomplicated: Secondary | ICD-10-CM

## 2016-05-02 DIAGNOSIS — Z0000 Encounter for general adult medical examination without abnormal findings: Secondary | ICD-10-CM

## 2016-05-02 DIAGNOSIS — Z23 Encounter for immunization: Secondary | ICD-10-CM

## 2016-05-02 LAB — LIPID PANEL
CHOLESTEROL: 200 mg/dL (ref 0–200)
HDL: 60.3 mg/dL (ref >39.00)
LDL Cholesterol: 110 mg/dL — ABNORMAL HIGH (ref 0–99)
NONHDL: 139.66
Total CHOL/HDL Ratio: 3
Triglycerides: 146 mg/dL (ref 0.0–149.0)
VLDL: 29.2 mg/dL (ref 0.0–40.0)

## 2016-05-02 LAB — COMPREHENSIVE METABOLIC PANEL
ALBUMIN: 4.5 g/dL (ref 3.5–5.2)
ALK PHOS: 128 U/L — AB (ref 39–117)
ALT: 17 U/L (ref 0–35)
AST: 15 U/L (ref 0–37)
BILIRUBIN TOTAL: 0.3 mg/dL (ref 0.2–1.2)
BUN: 16 mg/dL (ref 6–23)
CO2: 28 mEq/L (ref 19–32)
Calcium: 10.1 mg/dL (ref 8.4–10.5)
Chloride: 102 mEq/L (ref 96–112)
Creatinine, Ser: 0.86 mg/dL (ref 0.40–1.20)
GFR: 71.42 mL/min (ref >60.00)
GLUCOSE: 102 mg/dL — AB (ref 70–99)
POTASSIUM: 4.3 meq/L (ref 3.5–5.1)
SODIUM: 139 meq/L (ref 135–145)
TOTAL PROTEIN: 7.8 g/dL (ref 6.0–8.3)

## 2016-05-02 LAB — CBC
HCT: 43 % (ref 36.0–46.0)
Hemoglobin: 14.5 g/dL (ref 12.0–15.0)
MCHC: 33.7 g/dL (ref 30.0–36.0)
MCV: 89.6 fl (ref 78.0–100.0)
Platelets: 339 10*3/uL (ref 150.0–400.0)
RBC: 4.8 Mil/uL (ref 3.87–5.11)
RDW: 14.4 % (ref 11.5–15.5)
WBC: 7 10*3/uL (ref 4.0–10.5)

## 2016-05-02 LAB — TSH: TSH: 0.72 u[IU]/mL (ref 0.35–4.50)

## 2016-05-02 LAB — VITAMIN D 25 HYDROXY (VIT D DEFICIENCY, FRACTURES): VITD: 27.99 ng/mL — AB (ref 30.00–100.00)

## 2016-05-02 NOTE — Assessment & Plan Note (Signed)
BMI of 16.97. Recommend increasing weight 5-10% through nutritional intake and eating frequent small meals and calorically dense foods. TSH obtained to rule out thyroid origin. Most likely related to COPD. Nutritional referral if necessary. Continue to monitor.

## 2016-05-02 NOTE — Assessment & Plan Note (Signed)
1) Anticipatory Guidance: Discussed importance of wearing a seatbelt while driving and not texting while driving; changing batteries in smoke detector at least once annually; wearing suntan lotion when outside; eating a balanced and moderate diet; getting physical activity at least 30 minutes per day.  2) Immunizations / Screenings / Labs:  Tetanus updated today. Discussed plan for Shingles immunization. All other immunizations are up to date per recommendations. Due for a PAP with referral to GYN placed. Due for breast cancer screening with mammogram ordered. Obtain Hepatitis C antibody for hepatitis C screening. Obtain Vitamin D for Vitamin D deficiency. Due for dental and vision screen encouraged to be completed independently. All other screenings are up to date per recommendations. Obtain CBC, CMET, and lipid profile.    Overall well exam with risk factors for cardiovascular disease including tobacco use and COPD. Continues to work on tobacco cessation and reducing smoking. Has already completed a low dose lung cancer screening through pulmonology. COPD appears adequately managed with current medication regimen. Recommend increasing physical activity as tolerated. She is underweight with a BMI of 16.97 with recommendation increase caloric intake by eating frequent small meals as well as eating caloric lead dense foods. TSH will be checked to rule out any thyroid overactivity. Continue other healthy lifestyle behaviors and choices. Follow-up prevention exam in 1 year. Follow-up office visit pending blood work and for chronic conditions.

## 2016-05-02 NOTE — Assessment & Plan Note (Signed)
Continues to smoke. Discussed importance of tobacco cessation she continues to work on tobacco cessation through nicotine patches. Continue to monitor.

## 2016-05-02 NOTE — Patient Instructions (Signed)
Thank you for choosing [facility].  SUMMARY AND INSTRUCTIONS:  Medication:  Please continue to take your medications as prescribed.   Labs:  Please stop by the lab on the lower level of the building for your blood work. Your results will be released to [place] (or called to you) after review, usually within 72 hours after test completion. If any changes need to be made, you will be notified at that same time.  1.) The lab is open from 7:30am to 5:30 pm Monday-Friday 2.) No appointment is necessary 3.) Fasting (if needed) is 6-8 hours after food and drink; black coffee and water are okay   Referrals:  Someone will call to schedule your mammogram, PAP, and colonoscopy.  Referrals have been made during this visit. You should expect to hear back from our schedulers in about 7-10 days in regards to establishing an appointment with the specialists we discussed.   Follow up:  If your symptoms worsen or fail to improve, please contact our office for further instruction, or in case of emergency go directly to the emergency room at the closest medical facility.    Health Maintenance, Female Adopting a healthy lifestyle and getting preventive care can go a long way to promote health and wellness. Talk with your health care provider about what schedule of regular examinations is right for you. This is a good chance for you to check in with your provider about disease prevention and staying healthy. In between checkups, there are plenty of things you can do on your own. Experts have done a lot of research about which lifestyle changes and preventive measures are most likely to keep you healthy. Ask your health care provider for more information. WEIGHT AND DIET  Eat a healthy diet  Be sure to include plenty of vegetables, fruits, low-fat dairy products, and lean protein.  Do not eat a lot of foods high in solid fats, added sugars, or salt.  Get regular exercise. This is one of the  most important things you can do for your health.  Most adults should exercise for at least 150 minutes each week. The exercise should increase your heart rate and make you sweat (moderate-intensity exercise).  Most adults should also do strengthening exercises at least twice a week. This is in addition to the moderate-intensity exercise.  Maintain a healthy weight  Body mass index (BMI) is a measurement that can be used to identify possible weight problems. It estimates body fat based on height and weight. Your health care provider can help determine your BMI and help you achieve or maintain a healthy weight.  For females 5 years of age and older:   A BMI below 18.5 is considered underweight.  A BMI of 18.5 to 24.9 is normal.  A BMI of 25 to 29.9 is considered overweight.  A BMI of 30 and above is considered obese.  Watch levels of cholesterol and blood lipids  You should start having your blood tested for lipids and cholesterol at 60 years of age, then have this test every 5 years.  You may need to have your cholesterol levels checked more often if:  Your lipid or cholesterol levels are high.  You are older than 60 years of age.  You are at high risk for heart disease.  CANCER SCREENING   Lung Cancer  Lung cancer screening is recommended for adults 68-59 years old who are at high risk for lung cancer because of a history of smoking.  A yearly  low-dose CT scan of the lungs is recommended for people who:  Currently smoke.  Have quit within the past 15 years.  Have at least a 30-pack-year history of smoking. A pack year is smoking an average of one pack of cigarettes a day for 1 year.  Yearly screening should continue until it has been 15 years since you quit.  Yearly screening should stop if you develop a health problem that would prevent you from having lung cancer treatment.  Breast Cancer  Practice breast self-awareness. This means understanding how your  breasts normally appear and feel.  It also means doing regular breast self-exams. Let your health care provider know about any changes, no matter how small.  If you are in your 20s or 30s, you should have a clinical breast exam (CBE) by a health care provider every 1-3 years as part of a regular health exam.  If you are 72 or older, have a CBE every year. Also consider having a breast X-ray (mammogram) every year.  If you have a family history of breast cancer, talk to your health care provider about genetic screening.  If you are at high risk for breast cancer, talk to your health care provider about having an MRI and a mammogram every year.  Breast cancer gene (BRCA) assessment is recommended for women who have family members with BRCA-related cancers. BRCA-related cancers include:  Breast.  Ovarian.  Tubal.  Peritoneal cancers.  Results of the assessment will determine the need for genetic counseling and BRCA1 and BRCA2 testing. Cervical Cancer Your health care provider may recommend that you be screened regularly for cancer of the pelvic organs (ovaries, uterus, and vagina). This screening involves a pelvic examination, including checking for microscopic changes to the surface of your cervix (Pap test). You may be encouraged to have this screening done every 3 years, beginning at age 86.  For women ages 80-65, health care providers may recommend pelvic exams and Pap testing every 3 years, or they may recommend the Pap and pelvic exam, combined with testing for human papilloma virus (HPV), every 5 years. Some types of HPV increase your risk of cervical cancer. Testing for HPV may also be done on women of any age with unclear Pap test results.  Other health care providers may not recommend any screening for nonpregnant women who are considered low risk for pelvic cancer and who do not have symptoms. Ask your health care provider if a screening pelvic exam is right for you.  If you  have had past treatment for cervical cancer or a condition that could lead to cancer, you need Pap tests and screening for cancer for at least 20 years after your treatment. If Pap tests have been discontinued, your risk factors (such as having a new sexual partner) need to be reassessed to determine if screening should resume. Some women have medical problems that increase the chance of getting cervical cancer. In these cases, your health care provider may recommend more frequent screening and Pap tests. Colorectal Cancer  This type of cancer can be detected and often prevented.  Routine colorectal cancer screening usually begins at 60 years of age and continues through 60 years of age.  Your health care provider may recommend screening at an earlier age if you have risk factors for colon cancer.  Your health care provider may also recommend using home test kits to check for hidden blood in the stool.  A small camera at the end of a tube can  be used to examine your colon directly (sigmoidoscopy or colonoscopy). This is done to check for the earliest forms of colorectal cancer.  Routine screening usually begins at age 4.  Direct examination of the colon should be repeated every 5-10 years through 60 years of age. However, you may need to be screened more often if early forms of precancerous polyps or small growths are found. Skin Cancer  Check your skin from head to toe regularly.  Tell your health care provider about any new moles or changes in moles, especially if there is a change in a mole's shape or color.  Also tell your health care provider if you have a mole that is larger than the size of a pencil eraser.  Always use sunscreen. Apply sunscreen liberally and repeatedly throughout the day.  Protect yourself by wearing long sleeves, pants, a wide-brimmed hat, and sunglasses whenever you are outside. HEART DISEASE, DIABETES, AND HIGH BLOOD PRESSURE   High blood pressure causes  heart disease and increases the risk of stroke. High blood pressure is more likely to develop in:  People who have blood pressure in the high end of the normal range (130-139/85-89 mm Hg).  People who are overweight or obese.  People who are African American.  If you are 34-18 years of age, have your blood pressure checked every 3-5 years. If you are 42 years of age or older, have your blood pressure checked every year. You should have your blood pressure measured twice--once when you are at a hospital or clinic, and once when you are not at a hospital or clinic. Record the average of the two measurements. To check your blood pressure when you are not at a hospital or clinic, you can use:  An automated blood pressure machine at a pharmacy.  A home blood pressure monitor.  If you are between 71 years and 34 years old, ask your health care provider if you should take aspirin to prevent strokes.  Have regular diabetes screenings. This involves taking a blood sample to check your fasting blood sugar level.  If you are at a normal weight and have a low risk for diabetes, have this test once every three years after 60 years of age.  If you are overweight and have a high risk for diabetes, consider being tested at a younger age or more often. PREVENTING INFECTION  Hepatitis B  If you have a higher risk for hepatitis B, you should be screened for this virus. You are considered at high risk for hepatitis B if:  You were born in a country where hepatitis B is common. Ask your health care provider which countries are considered high risk.  Your parents were born in a high-risk country, and you have not been immunized against hepatitis B (hepatitis B vaccine).  You have HIV or AIDS.  You use needles to inject street drugs.  You live with someone who has hepatitis B.  You have had sex with someone who has hepatitis B.  You get hemodialysis treatment.  You take certain medicines for  conditions, including cancer, organ transplantation, and autoimmune conditions. Hepatitis C  Blood testing is recommended for:  Everyone born from [birth year] through 1965.  Anyone with known risk factors for hepatitis C. Sexually transmitted infections (STIs)  You should be screened for sexually transmitted infections (STIs) including gonorrhea and chlamydia if:  You are sexually active and are younger than 60 years of age.  You are older than 61 years of age  and your health care provider tells you that you are at risk for this type of infection.  Your sexual activity has changed since you were last screened and you are at an increased risk for chlamydia or gonorrhea. Ask your health care provider if you are at risk.  If you do not have HIV, but are at risk, it may be recommended that you take a prescription medicine daily to prevent HIV infection. This is called pre-exposure prophylaxis (PrEP). You are considered at risk if:  You are sexually active and do not regularly use condoms or know the HIV status of your partner(s).  You take drugs by injection.  You are sexually active with a partner who has HIV. Talk with your health care provider about whether you are at high risk of being infected with HIV. If you choose to begin PrEP, you should first be tested for HIV. You should then be tested every 3 months for as long as you are taking PrEP.  PREGNANCY   If you are premenopausal and you may become pregnant, ask your health care provider about preconception counseling.  If you may become pregnant, take 400 to 800 micrograms (mcg) of folic acid every day.  If you want to prevent pregnancy, talk to your health care provider about birth control (contraception). OSTEOPOROSIS AND MENOPAUSE   Osteoporosis is a disease in which the bones lose minerals and strength with aging. This can result in serious bone fractures. Your risk for osteoporosis can be identified using a bone density scan.  If  you are 22 years of age or older, or if you are at risk for osteoporosis and fractures, ask your health care provider if you should be screened.  Ask your health care provider whether you should take a calcium or vitamin D supplement to lower your risk for osteoporosis.  Menopause may have certain physical symptoms and risks.  Hormone replacement therapy may reduce some of these symptoms and risks. Talk to your health care provider about whether hormone replacement therapy is right for you.  HOME CARE INSTRUCTIONS   Schedule regular health, dental, and eye exams.  Stay current with your immunizations.   Do not use any tobacco products including cigarettes, chewing tobacco, or electronic cigarettes.  If you are pregnant, do not drink alcohol.  If you are breastfeeding, limit how much and how often you drink alcohol.  Limit alcohol intake to no more than 1 drink per day for nonpregnant women. One drink equals 12 ounces of beer, 5 ounces of wine, or 1 ounces of hard liquor.  Do not use street drugs.  Do not share needles.  Ask your health care provider for help if you need support or information about quitting drugs.  Tell your health care provider if you often feel depressed.  Tell your health care provider if you have ever been abused or do not feel safe at home.   This information is not intended to replace advice given to you by your health care provider. Make sure you discuss any questions you have with your health care provider.   Document Released: 12/20/2010 Document Revised: 06/27/2014 Document Reviewed: 05/08/2013 Elsevier Interactive Patient Education [employer].

## 2016-05-02 NOTE — Progress Notes (Signed)
Subjective:    Patient ID: KESSLER AGATE, female    DOB: 1956-05-02, 60 y.o.   MRN: [id number]  Chief Complaint  Patient presents with  . CPE    fasting    HPI:  Robin Rivera is a 60 y.o. female who presents today for an annual wellness visit.   1) Health Maintenance -   Diet - Averages about 2-3 meals per day consisting of a regular diet; Caffeine intake of about 6-7 cups per day  Exercise - Little exercise; nothing structured   2) Preventative Exams / Immunizations:  Dental -- Due for exam  Vision -- Due for exam   Health Maintenance  Topic Date Due  . Hepatitis C Screening  23-Dec-1955  . HIV Screening  11/09/1970  . TETANUS/TDAP  11/09/1974  . PAP SMEAR  11/08/1976  . MAMMOGRAM  11/08/2005  . COLONOSCOPY  11/08/2005  . ZOSTAVAX  11/09/2015  . INFLUENZA VACCINE  Completed    Immunization History  Administered Date(s) Administered  . Influenza Split 04/12/2011, 04/15/2013, 03/25/2014, 03/22/2016  . Influenza,inj,Quad PF,36+ Mos 03/21/2015  . Pneumococcal Polysaccharide-23 02/15/2016  . Tdap 05/02/2016     No Known Allergies   Outpatient Medications Prior to Visit  Medication Sig Dispense Refill  . albuterol (PROVENTIL HFA;VENTOLIN HFA) 108 (90 Base) MCG/ACT inhaler Inhale 2 puffs into the lungs every 6 (six) hours as needed. 1 Inhaler 5  . buPROPion (ZYBAN) 150 MG 12 hr tablet Take 1 tablet (150 mg total) by mouth 2 (two) times daily. 60 tablet 3  . fluticasone (FLONASE) 50 MCG/ACT nasal spray Place 2 sprays into the nose daily. 16 g 0  . nicotine (NICODERM CQ - DOSED IN MG/24 HOURS) 21 mg/24hr patch Place 21 mg onto the skin daily.    Marland Kitchen omeprazole (PRILOSEC) 40 MG capsule Take 1 capsule (40 mg total) by mouth daily. 90 capsule 0  . Tiotropium Bromide-Olodaterol (STIOLTO RESPIMAT) 2.5-2.5 MCG/ACT AERS Inhale into the lungs.     Facility-Administered Medications Prior to Visit  Medication Dose Route Frequency Provider Last Rate Last Dose  . 0.9 %   sodium chloride infusion  500 mL Intravenous Continuous Irene Shipper, MD         Past Medical History:  Diagnosis Date  . COPD (chronic obstructive pulmonary disease) ([place])   . GERD (gastroesophageal reflux disease)   . Rhinitis   . Tobacco abuse      Past Surgical History:  Procedure Laterality Date  . TONSILLECTOMY    . TOTAL ABDOMINAL HYSTERECTOMY     Partial hysterectomy     Family History  Problem Relation Age of Onset  . Adopted: Yes  . Colon cancer Maternal Aunt      Social History   Social History  . Marital status: Married    Spouse name: N/A  . Number of children: 3  . Years of education: 12   Occupational History  . ICU secretary  [employer]   Social History Main Topics  . Smoking status: Current Every Day Smoker    Packs/day: 0.75    Years: 46.00    Types: Cigarettes  . Smokeless tobacco: Never Used     Comment: currently smoking 4-5 cigs/day (04/19/16)  . Alcohol use No  . Drug use: No  . Sexual activity: Not on file   Other Topics Concern  . Not on file   Social History Narrative   Fun: Play games on her computer, bon fires   Denies  abuse and feels safe at home.       Review of Systems  Constitutional: Denies fever, chills, fatigue, or significant weight gain/loss. HENT: Head: Denies headache or neck pain Ears: Denies changes in hearing, ringing in ears, earache, drainage Nose: Denies discharge, stuffiness, itching, nosebleed, sinus pain Throat: Denies sore throat, hoarseness, dry mouth, sores, thrush Eyes: Denies loss/changes in vision, pain, redness, blurry/double vision, flashing lights Cardiovascular: Denies chest pain/discomfort, tightness, palpitations, shortness of breath with activity, difficulty lying down, swelling, sudden awakening with shortness of breath Respiratory: Denies shortness of breath, cough, sputum production, wheezing Gastrointestinal: Denies dysphasia, heartburn, change in appetite, nausea,  change in bowel habits, rectal bleeding, constipation, diarrhea, yellow skin or eyes Genitourinary: Denies frequency, urgency, burning/pain, blood in urine, incontinence, change in urinary strength. Musculoskeletal: Denies muscle/joint pain, stiffness, back pain, redness or swelling of joints, trauma Skin: Denies rashes, lumps, itching, dryness, color changes, or hair/nail changes Neurological: Denies dizziness, fainting, seizures, weakness, numbness, tingling, tremor Psychiatric - Denies nervousness, stress, depression or memory loss Endocrine: Denies heat or cold intolerance, sweating, frequent urination, excessive thirst, changes in appetite Hematologic: Denies ease of bruising or bleeding     Objective:     BP 122/80 (BP Location: Left Arm, Patient Position: Sitting, Cuff Size: Normal)   Pulse 85   Temp 98.5 F (36.9 C) (Oral)   Resp 16   Ht 5\' 5"  (1.651 m)   Wt 102 lb (46.3 kg)   SpO2 96%   BMI 16.97 kg/m  Nursing note and vital signs reviewed.  Physical Exam  Constitutional: She is oriented to person, place, and time. She appears well-nourished. She appears cachectic.  HENT:  Head: Normocephalic.  Right Ear: Hearing, tympanic membrane, external ear and ear canal normal.  Left Ear: Hearing, tympanic membrane, external ear and ear canal normal.  Nose: Nose normal.  Mouth/Throat: Uvula is midline, oropharynx is clear and moist and mucous membranes are normal.  Eyes: Conjunctivae and EOM are normal. Pupils are equal, round, and reactive to light.  Neck: Neck supple. No JVD present. No tracheal deviation present. No thyromegaly present.  Cardiovascular: Normal rate, regular rhythm, normal heart sounds and intact distal pulses.   Pulmonary/Chest: Effort normal and breath sounds normal.  Abdominal: Soft. Bowel sounds are normal. She exhibits no distension and no mass. There is no tenderness. There is no rebound and no guarding.  Musculoskeletal: Normal range of motion. She  exhibits no edema or tenderness.  Lymphadenopathy:    She has no cervical adenopathy.  Neurological: She is alert and oriented to person, place, and time. She has normal reflexes. No cranial nerve deficit. She exhibits normal muscle tone. Coordination normal.  Skin: Skin is warm and dry.  Psychiatric: She has a normal mood and affect. Her behavior is normal. Judgment and thought content normal.       Assessment & Plan:   Problem List Items Addressed This Visit      Other   TOBACCO ABUSE - Primary    Continues to smoke. Discussed importance of tobacco cessation she continues to work on tobacco cessation through nicotine patches. Continue to monitor.      Routine general medical examination at a health care facility    1) Anticipatory Guidance: Discussed importance of wearing a seatbelt while driving and not texting while driving; changing batteries in smoke detector at least once annually; wearing suntan lotion when outside; eating a balanced and moderate diet; getting physical activity at least 30 minutes per day.  2) Immunizations /  Screenings / Labs:  Tetanus updated today. Discussed plan for Shingles immunization. All other immunizations are up to date per recommendations. Due for a PAP with referral to GYN placed. Due for breast cancer screening with mammogram ordered. Obtain Hepatitis C antibody for hepatitis C screening. Obtain Vitamin D for Vitamin D deficiency. Due for dental and vision screen encouraged to be completed independently. All other screenings are up to date per recommendations. Obtain CBC, CMET, and lipid profile.    Overall well exam with risk factors for cardiovascular disease including tobacco use and COPD. Continues to work on tobacco cessation and reducing smoking. Has already completed a low dose lung cancer screening through pulmonology. COPD appears adequately managed with current medication regimen. Recommend increasing physical activity as tolerated. She is  underweight with a BMI of 16.97 with recommendation increase caloric intake by eating frequent small meals as well as eating caloric lead dense foods. TSH will be checked to rule out any thyroid overactivity. Continue other healthy lifestyle behaviors and choices. Follow-up prevention exam in 1 year. Follow-up office visit pending blood work and for chronic conditions.       Relevant Orders   CBC (Completed)   Comprehensive metabolic panel (Completed)   Lipid panel (Completed)   VITAMIN D 25 Hydroxy (Vit-D Deficiency, Fractures) (Completed)   Ambulatory referral to Gastroenterology   MM DIGITAL SCREENING BILATERAL   Ambulatory referral to Gynecology   Hepatitis C antibody   TSH (Completed)   Underweight    BMI of 16.97. Recommend increasing weight 5-10% through nutritional intake and eating frequent small meals and calorically dense foods. TSH obtained to rule out thyroid origin. Most likely related to COPD. Nutritional referral if necessary. Continue to monitor.       Other Visit Diagnoses    Need for diphtheria-tetanus-pertussis (Tdap) vaccine, adult/adolescent       Relevant Orders   Tdap vaccine greater than or equal to 7yo IM (Completed)       I am having Ms. Starling maintain her fluticasone, Tiotropium Bromide-Olodaterol, albuterol, buPROPion, omeprazole, and nicotine. We will continue to administer sodium chloride.   Follow-up: Return in about 6 months (around 10/30/2016), or if symptoms worsen or fail to improve.   Mauricio Po, FNP

## 2016-05-03 LAB — HEPATITIS C ANTIBODY: HCV AB: NEGATIVE

## 2016-05-05 ENCOUNTER — Other Ambulatory Visit: Payer: Self-pay | Admitting: Family

## 2016-05-05 DIAGNOSIS — Z1231 Encounter for screening mammogram for malignant neoplasm of breast: Secondary | ICD-10-CM

## 2016-05-25 ENCOUNTER — Encounter: Payer: Self-pay | Admitting: Obstetrics and Gynecology

## 2016-05-25 ENCOUNTER — Ambulatory Visit (INDEPENDENT_AMBULATORY_CARE_PROVIDER_SITE_OTHER): Payer: 59 | Admitting: Obstetrics and Gynecology

## 2016-05-25 VITALS — BP 112/70 | HR 80 | Resp 18 | Ht 64.5 in | Wt 103.0 lb

## 2016-05-25 DIAGNOSIS — F17200 Nicotine dependence, unspecified, uncomplicated: Secondary | ICD-10-CM | POA: Diagnosis not present

## 2016-05-25 DIAGNOSIS — R636 Underweight: Secondary | ICD-10-CM | POA: Diagnosis not present

## 2016-05-25 DIAGNOSIS — Z01419 Encounter for gynecological examination (general) (routine) without abnormal findings: Secondary | ICD-10-CM | POA: Diagnosis not present

## 2016-05-25 DIAGNOSIS — E2839 Other primary ovarian failure: Secondary | ICD-10-CM

## 2016-05-25 NOTE — Patient Instructions (Signed)

## 2016-05-25 NOTE — Progress Notes (Signed)
60 y.o. [id number] MarriedCaucasianF here for annual exam.  In the 90's she had a TAH for endometriosis. She still has both adnexa No vaginal bleeding.  Sexually active, no pain.  She is a smoker, has the patch on. Currently smoking 8 a day. January 1st is her quit date.  Husband doesn't smoke, 2 kids at home do smoke.     No LMP recorded. Patient has had a hysterectomy.          Sexually active: Yes.    The current method of family planning is status post hysterectomy.    Exercising: No.  The patient does not participate in regular exercise at present. Smoker:  yes  Health Maintenance: Pap:  1993  History of abnormal Pap:  no MMG:  1991 -scheduled next week Colonoscopy:  Never BMD:   Never TDaP:  05-02-16 Gardasil: N/A   reports that she has been smoking Cigarettes.  She has a 34.50 pack-year smoking history. She has never used smokeless tobacco. She reports that she does not drink alcohol or use drugs.3 grown kids, 2 live at home (both work). 3rd in [place] with his wife. She has 5 grandchildren (5-18).  Past Medical History:  Diagnosis Date  . COPD (chronic obstructive pulmonary disease) ([place])   . GERD (gastroesophageal reflux disease)   . Rhinitis   . Tobacco abuse     Past Surgical History:  Procedure Laterality Date  . BREAST SURGERY Bilateral    lumpectomy   . CESAREAN SECTION    . TONSILLECTOMY    . TOTAL ABDOMINAL HYSTERECTOMY     Partial hysterectomy  1 c/s, 2 vaginal deliveries.  Current Outpatient Prescriptions  Medication Sig Dispense Refill  . albuterol (PROVENTIL HFA;VENTOLIN HFA) 108 (90 Base) MCG/ACT inhaler Inhale 2 puffs into the lungs every 6 (six) hours as needed. 1 Inhaler 5  . buPROPion (ZYBAN) 150 MG 12 hr tablet Take 1 tablet (150 mg total) by mouth 2 (two) times daily. 60 tablet 3  . fluticasone (FLONASE) 50 MCG/ACT nasal spray Place 2 sprays into the nose daily. 16 g 0  . nicotine (NICODERM CQ - DOSED IN MG/24 HOURS) 21 mg/24hr patch Place 21  mg onto the skin daily.    Marland Kitchen omeprazole (PRILOSEC) 40 MG capsule Take 1 capsule (40 mg total) by mouth daily. 90 capsule 0  . Tiotropium Bromide-Olodaterol (STIOLTO RESPIMAT) 2.5-2.5 MCG/ACT AERS Inhale into the lungs.     Current Facility-Administered Medications  Medication Dose Route Frequency Provider Last Rate Last Dose  . 0.9 %  sodium chloride infusion  500 mL Intravenous Continuous Irene Shipper, MD        Family History  Problem Relation Age of Onset  . Adopted: Yes  . Colon cancer Maternal Aunt   MAunt was 14 with diagnosis, doesn't know the rest of her family history.  Review of Systems  Constitutional: Negative.   HENT: Negative.   Eyes: Negative.   Respiratory: Negative.   Cardiovascular: Negative.   Gastrointestinal: Negative.   Endocrine: Negative.   Genitourinary: Negative.   Musculoskeletal: Negative.   Skin: Negative.   Allergic/Immunologic: Negative.   Neurological: Negative.   Psychiatric/Behavioral: Negative.     Exam:   BP 112/70 (BP Location: Right Arm, Patient Position: Sitting, Cuff Size: Normal)   Pulse 80   Resp 18   Ht 5' 4.5" (1.638 m)   Wt 103 lb (46.7 kg)   BMI 17.41 kg/m   Weight change: @WEIGHTCHANGE @ Height:   Height: 5' 4.5" (163.8  cm)  Ht Readings from Last 3 Encounters:  05/25/16 5' 4.5" (1.638 m)  05/02/16 5\' 5"  (1.651 m)  04/21/16 5\' 5"  (1.651 m)    General appearance: alert, cooperative and appears stated age Head: Normocephalic, without obvious abnormality, atraumatic Neck: no adenopathy, supple, symmetrical, trachea midline and thyroid normal to inspection and palpation Lungs: clear to auscultation bilaterally Breasts: normal appearance, no masses or tenderness Heart: regular rate and rhythm Abdomen: soft, non-tender; bowel sounds normal; no masses,  no organomegaly Extremities: extremities normal, atraumatic, no cyanosis or edema Skin: Skin color, texture, turgor normal. No rashes or lesions Lymph nodes: Cervical,  supraclavicular, and axillary nodes normal. No abnormal inguinal nodes palpated Neurologic: Grossly normal   Pelvic: External genitalia:  no lesions              Urethra:  normal appearing urethra with no masses, tenderness or lesions              Bartholins and Skenes: normal                 Vagina: normal appearing atrophic vagina with normal color and discharge, no lesions              Cervix: absent               Bimanual Exam:  Uterus:  uterus absent              Adnexa: no mass, fullness, tenderness               Rectovaginal: Confirms               Anus:  normal sphincter tone, no lesions  Chaperone was present for exam.  A:  Well Woman with normal exam  P:   No pap needed  Mammogram next week  Primary is setting her up for a colonoscopy  Labs and immunizations with primary  Discussed breast self exam  Discussed calcium and vit D intake  Will send her for a DEXA (PMP, underweight, smoker)

## 2016-05-31 ENCOUNTER — Ambulatory Visit
Admission: RE | Admit: 2016-05-31 | Discharge: 2016-05-31 | Disposition: A | Discharge status: Home / Self Care | Payer: 59 | Source: Ambulatory Visit | Attending: Family | Admitting: Family

## 2016-05-31 DIAGNOSIS — Z1231 Encounter for screening mammogram for malignant neoplasm of breast: Secondary | ICD-10-CM | POA: Diagnosis not present

## 2016-06-07 ENCOUNTER — Ambulatory Visit
Admission: RE | Admit: 2016-06-07 | Discharge: 2016-06-07 | Disposition: A | Discharge status: Home / Self Care | Payer: 59 | Source: Ambulatory Visit | Attending: Obstetrics and Gynecology | Admitting: Obstetrics and Gynecology

## 2016-06-07 DIAGNOSIS — M810 Age-related osteoporosis without current pathological fracture: Secondary | ICD-10-CM | POA: Diagnosis not present

## 2016-06-07 DIAGNOSIS — F17200 Nicotine dependence, unspecified, uncomplicated: Secondary | ICD-10-CM

## 2016-06-07 DIAGNOSIS — R636 Underweight: Secondary | ICD-10-CM

## 2016-06-07 DIAGNOSIS — E2839 Other primary ovarian failure: Secondary | ICD-10-CM

## 2016-06-07 DIAGNOSIS — Z780 Asymptomatic menopausal state: Secondary | ICD-10-CM | POA: Diagnosis not present

## 2016-06-09 ENCOUNTER — Ambulatory Visit (INDEPENDENT_AMBULATORY_CARE_PROVIDER_SITE_OTHER): Payer: 59 | Admitting: Internal Medicine

## 2016-06-09 ENCOUNTER — Encounter: Payer: Self-pay | Admitting: Internal Medicine

## 2016-06-09 VITALS — BP 104/70 | HR 80 | Ht 64.5 in | Wt 104.1 lb

## 2016-06-09 DIAGNOSIS — Z1211 Encounter for screening for malignant neoplasm of colon: Secondary | ICD-10-CM | POA: Diagnosis not present

## 2016-06-09 DIAGNOSIS — R1310 Dysphagia, unspecified: Secondary | ICD-10-CM | POA: Diagnosis not present

## 2016-06-09 DIAGNOSIS — K2100 Gastro-esophageal reflux disease with esophagitis, without bleeding: Secondary | ICD-10-CM

## 2016-06-09 MED ORDER — OMEPRAZOLE 40 MG PO CPDR: 40.0000 mg | DELAYED_RELEASE_CAPSULE | Freq: Every day | ORAL | 3 refills | Status: DC

## 2016-06-09 MED ORDER — NA SULFATE-K SULFATE-MG SULF 17.5-3.13-1.6 GM/177ML PO SOLN: 1.0000 | ORAL | 0 refills | Status: AC

## 2016-06-09 MED FILL — OMEPRAZOLE DR 40 MG CAPSULE: 40 | 90 days supply | Qty: 90 | Fill #0

## 2016-06-09 MED FILL — SUPREP BOWEL PREP KIT: 17.5-3.13-1 | 1 days supply | Qty: 354 | Fill #0

## 2016-06-09 NOTE — Progress Notes (Signed)
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 See that dictation. She was continued on PPI and schedule for upper endoscopy. She was found to have a benign distal esophageal stricture which was dilated to 90 [place]. She was continued on PPI and follows up at this time. The patient's please report that she is having no reflux symptoms on PPI. As well, her dysphagia has resolved. She does report recent transient lower abdominal discomfort which lasted several hours, resolved, and has not returned. She has not had prior screening colonoscopy.  REVIEW OF SYSTEMS:  All non-GI ROS negative on review  Past Medical History:  Diagnosis Date  . COPD (chronic obstructive pulmonary disease) ([place])   . Esophageal stricture   . GERD (gastroesophageal reflux disease)   . Rhinitis   . Tobacco abuse     Past Surgical History:  Procedure Laterality Date  . BREAST SURGERY Bilateral    lumpectomy   . CESAREAN SECTION    . TONSILLECTOMY    . TOTAL ABDOMINAL HYSTERECTOMY     Partial hysterectomy    Social History Robin Rivera  reports that she has been smoking Cigarettes.  She has a 34.50 pack-year smoking history. She has never used smokeless tobacco. She reports that she does not drink alcohol or use drugs.  family history includes Colon cancer in her maternal aunt. She was adopted.  No Known Allergies     PHYSICAL EXAMINATION: Vital signs: BP 104/70   Pulse 80   Ht 5' 4.5" (1.638 m)   Wt 104 lb 2 oz (47.2 kg)   BMI 17.60 kg/m  General: Thin, well-nourished, no acute distress HEENT: Sclerae are anicteric, conjunctiva pink. Oral mucosa intact Lungs: Clear Heart: Regular Abdomen: soft, nontender, nondistended, no obvious ascites, no peritoneal signs, normal bowel sounds. No organomegaly. Extremities: No clubbing cyanosis or edema Psychiatric: alert  and oriented x3. Cooperative   ASSESSMENT:  1. GERD, complicated by peptic stricture. Asymptomatic post dilation on PPI 2. Colon cancer screening. Baseline risk   PLAN:  1. Reflux precautions 2. Stop smoking 3. Continue PPI 4. Schedule screening colonoscopy.The nature of the procedure, as well as the risks, benefits, and alternatives were carefully and thoroughly reviewed with the patient. Ample time for discussion and questions allowed. The patient understood, was satisfied, and agreed to proceed.

## 2016-06-09 NOTE — Patient Instructions (Signed)

## 2016-06-23 ENCOUNTER — Telehealth: Payer: Self-pay | Admitting: *Deleted

## 2016-06-23 NOTE — Telephone Encounter (Signed)
-----   Message from Robin Dom, MD sent at 06/09/2016  5:45 PM EST ----- The patient has osteoporosis. Please inform and set her up for an appointment to discuss

## 2016-06-23 NOTE — Telephone Encounter (Signed)
Spoke with patient and gave results. Scheduled for next week with Dr. Talbert Nan -eh

## 2016-06-23 NOTE — Telephone Encounter (Signed)
Left message for patient to call regarding DEXA results -eh

## 2016-06-28 ENCOUNTER — Ambulatory Visit: Payer: Self-pay | Admitting: Obstetrics and Gynecology

## 2016-06-28 ENCOUNTER — Encounter: Payer: Self-pay | Admitting: Obstetrics and Gynecology

## 2016-06-28 ENCOUNTER — Ambulatory Visit (INDEPENDENT_AMBULATORY_CARE_PROVIDER_SITE_OTHER): Payer: 59 | Admitting: Obstetrics and Gynecology

## 2016-06-28 VITALS — BP 102/60 | HR 68 | Resp 18 | Wt 105.0 lb

## 2016-06-28 DIAGNOSIS — M810 Age-related osteoporosis without current pathological fracture: Secondary | ICD-10-CM

## 2016-06-28 NOTE — Progress Notes (Signed)
GYNECOLOGY  VISIT   HPI: 61 y.o.   Married  Caucasian  female   [phone] with No LMP recorded. Patient has had a hysterectomy.   here to discuss DEXA scan results. Recent DEXA with severe osteoporosis. T score in her spine of -3.6, T score in her hip of -3.5.  She is a smoker, has a h/o GERD and COPD. She is on omeprazole.  Last vit D level was 27.99, she didn't increase her daily vit D from 1,000 IU a day. She does get about 2-3 servings of calcium a day. She doesn't exercise. She is currently smoking 10 cigarettes a day on average, she is going to get back on her patch, wants to quit. She talks to her Pulmonologist regularly about this.   GYNECOLOGIC HISTORY: No LMP recorded. Patient has had a hysterectomy. Contraception:postmenopause  Menopausal hormone therapy: none        OB History    Gravida Para Term Preterm AB Living   _0 SAB TAB Ectopic Multiple Live Births           3         Patient Active Problem List   Diagnosis Date Noted  . Routine general medical examination at a health care facility 05/02/2016  . Underweight 05/02/2016  . GERD (gastroesophageal reflux disease) 03/29/2016  . Eye irritation 02/15/2016  . Dysphagia 02/04/2016  . Atypical chest pain 02/04/2016  . Coronary artery calcification seen on CAT scan 02/04/2016  . Chronic obstructive pulmonary disease ([place]) 08/16/2013  . TOBACCO ABUSE 01/09/2009    Past Medical History:  Diagnosis Date  . COPD (chronic obstructive pulmonary disease) ([place])   . Esophageal stricture   . GERD (gastroesophageal reflux disease)   . Rhinitis   . Tobacco abuse     Past Surgical History:  Procedure Laterality Date  . BREAST SURGERY Bilateral    lumpectomy   . CESAREAN SECTION    . TONSILLECTOMY    . TOTAL ABDOMINAL HYSTERECTOMY     Partial hysterectomy    Current Outpatient Prescriptions  Medication Sig Dispense Refill  . albuterol (PROVENTIL HFA;VENTOLIN HFA) 108 (90 Base) MCG/ACT inhaler Inhale 2  puffs into the lungs every 6 (six) hours as needed. 1 Inhaler 5  . buPROPion (ZYBAN) 150 MG 12 hr tablet Take 1 tablet (150 mg total) by mouth 2 (two) times daily. 60 tablet 3  . fluticasone (FLONASE) 50 MCG/ACT nasal spray Place 2 sprays into the nose daily. 16 g 0  . Na Sulfate-K Sulfate-Mg Sulf 17.5-3.13-1.6 GM/180ML SOLN Take 1 kit by mouth as directed. 354 mL 0  . nicotine (NICODERM CQ - DOSED IN MG/24 HOURS) 21 mg/24hr patch Place 21 mg onto the skin daily.    Marland Kitchen omeprazole (PRILOSEC) 40 MG capsule Take 1 capsule (40 mg total) by mouth daily. 90 capsule 3  . Tiotropium Bromide-Olodaterol (STIOLTO RESPIMAT) 2.5-2.5 MCG/ACT AERS Inhale into the lungs.     Current Facility-Administered Medications  Medication Dose Route Frequency Provider Last Rate Last Dose  . 0.9 %  sodium chloride infusion  500 mL Intravenous Continuous Irene Shipper, MD         ALLERGIES: Patient has no known allergies.  Family History  Problem Relation Age of Onset  . Adopted: Yes  . Colon cancer Maternal Aunt     Social History   Social History  . Marital status: Married    Spouse name: N/A  . Number of  children: 3  . Years of education: 12   Occupational History  . ICU secretary  [employer]   Social History Main Topics  . Smoking status: Current Every Day Smoker    Packs/day: 0.75    Years: 46.00    Types: Cigarettes  . Smokeless tobacco: Never Used     Comment: currently smoking 4-5 cigs/day (04/19/16)  . Alcohol use No  . Drug use: No  . Sexual activity: Yes    Partners: Male    Birth control/ protection: Surgical   Other Topics Concern  . Not on file   Social History Narrative   Fun: Play games on her computer, bon fires   Denies abuse and feels safe at home.     Review of Systems  Constitutional: Negative.   HENT: Negative.   Eyes: Negative.   Respiratory: Negative.   Cardiovascular: Negative.   Gastrointestinal: Negative.   Genitourinary: Negative.    Musculoskeletal: Negative.   Skin: Negative.   Neurological: Negative.   Endo/Heme/Allergies: Negative.   Psychiatric/Behavioral: Negative.     PHYSICAL EXAMINATION:    BP 102/60 (BP Location: Right Arm, Patient Position: Sitting, Cuff Size: Normal)   Pulse 68   Resp 18   Wt 105 lb (47.6 kg)   BMI 17.74 kg/m     General appearance: alert, cooperative and appears stated age   ASSESSMENT Severe osteoporosis, not a candidate for oral biphosphonates    PLAN Increase vit d to 2,000 IU a day Calcium 1,200 mg a day Weight bearing exercise Be careful to avoid increased risk of falls, no contact sports CMP, vit D, PTH 24 hour urine for creatinine and calcium Will send to Rheumatology to discuss possibly starting Teriparatide or IV Biphoshonates Discussed potentially transitioning to prolia in the future Encouraged to quit smoking Information on osteoporosis and treatment given to the patient from Up To Date   An After Visit Summary was printed and given to the patient.  15 minutes face to face time of which over 50% was spent in counseling.

## 2016-06-29 ENCOUNTER — Telehealth: Payer: Self-pay | Admitting: Obstetrics and Gynecology

## 2016-06-29 NOTE — Telephone Encounter (Signed)
Opened in error

## 2016-06-30 ENCOUNTER — Telehealth: Payer: Self-pay | Admitting: Obstetrics and Gynecology

## 2016-06-30 NOTE — Telephone Encounter (Signed)
Called patient  regarding FMLA forms received by our office, via fax. It appears the forms may have been sent to our office in error.  Left Voicemail requesting a call back.

## 2016-06-30 NOTE — Telephone Encounter (Signed)
Spoke with patient regarding FMLA forms. Patient states she wanted to see if Dr Talbert Nan would review for possible intermittent leave.  I explained to patient I had corresponded with Dr Talbert Nan regarding the FMLA forms and advised patient, there was no gynecology issue that would warrant FMLA. Patient understood information.  Routing to Dr Talbert Nan for final review

## 2016-07-04 ENCOUNTER — Other Ambulatory Visit (INDEPENDENT_AMBULATORY_CARE_PROVIDER_SITE_OTHER): Payer: 59

## 2016-07-04 DIAGNOSIS — M810 Age-related osteoporosis without current pathological fracture: Secondary | ICD-10-CM | POA: Diagnosis not present

## 2016-07-04 LAB — COMPREHENSIVE METABOLIC PANEL
ALT: 12 U/L (ref 6–29)
AST: 14 U/L (ref 10–35)
Albumin: 4.2 g/dL (ref 3.6–5.1)
Alkaline Phosphatase: 119 U/L (ref 33–130)
BUN: 16 mg/dL (ref 7–25)
CHLORIDE: 103 mmol/L (ref 98–110)
CO2: 28 mmol/L (ref 20–31)
CREATININE: 0.86 mg/dL (ref 0.50–0.99)
Calcium: 9.9 mg/dL (ref 8.6–10.4)
Glucose, Bld: 93 mg/dL (ref 65–99)
POTASSIUM: 4.2 mmol/L (ref 3.5–5.3)
SODIUM: 139 mmol/L (ref 135–146)
TOTAL PROTEIN: 7 g/dL (ref 6.1–8.1)
Total Bilirubin: 0.3 mg/dL (ref 0.2–1.2)

## 2016-07-05 LAB — CREATININE, URINE, 24 HOUR
CREATININE 24H UR: 0.99 g/(24.h) (ref 0.63–2.50)
CREATININE, URINE: 45 mg/dL (ref 20–320)

## 2016-07-05 LAB — CALCIUM, URINE, 24 HOUR
CALCIUM 24HR UR: 198 mg/(24.h) (ref 35–250)
Calcium, Ur: 9 mg/dL

## 2016-07-05 LAB — PTH, INTACT AND CALCIUM
Calcium: 9.9 mg/dL (ref 8.6–10.4)
PTH: 38 pg/mL (ref 14–64)

## 2016-07-05 LAB — VITAMIN D 25 HYDROXY (VIT D DEFICIENCY, FRACTURES): Vit D, 25-Hydroxy: 35 ng/mL (ref 30–100)

## 2016-07-11 ENCOUNTER — Telehealth: Payer: Self-pay | Admitting: Rheumatology

## 2016-07-11 NOTE — Telephone Encounter (Signed)
Ok to schedule osteoporosis eval. Please

## 2016-07-11 NOTE — Telephone Encounter (Signed)
Has this referral been reviewed? The office that referred called to check status of referral.

## 2016-07-13 ENCOUNTER — Encounter: Payer: Self-pay | Admitting: Pulmonary Disease

## 2016-07-13 ENCOUNTER — Ambulatory Visit (INDEPENDENT_AMBULATORY_CARE_PROVIDER_SITE_OTHER): Payer: 59 | Admitting: Pulmonary Disease

## 2016-07-13 VITALS — BP 106/68 | HR 80 | Ht 64.5 in | Wt 101.8 lb

## 2016-07-13 DIAGNOSIS — J432 Centrilobular emphysema: Secondary | ICD-10-CM

## 2016-07-13 DIAGNOSIS — F17200 Nicotine dependence, unspecified, uncomplicated: Secondary | ICD-10-CM

## 2016-07-13 NOTE — Patient Instructions (Signed)
Congratulations on stopping smoking!!!!  Call when you are ready to transition off bupropion  Follow up in 6 months

## 2016-07-13 NOTE — Progress Notes (Signed)
Current Outpatient Prescriptions on File Prior to Visit  Medication Sig  . albuterol (PROVENTIL HFA;VENTOLIN HFA) 108 (90 Base) MCG/ACT inhaler Inhale 2 puffs into the lungs every 6 (six) hours as needed.  Marland Kitchen buPROPion (ZYBAN) 150 MG 12 hr tablet Take 1 tablet (150 mg total) by mouth 2 (two) times daily.  . fluticasone (FLONASE) 50 MCG/ACT nasal spray Place 2 sprays into the nose daily.  . nicotine (NICODERM CQ - DOSED IN MG/24 HOURS) 21 mg/24hr patch Place 21 mg onto the skin daily.  Marland Kitchen omeprazole (PRILOSEC) 40 MG capsule Take 1 capsule (40 mg total) by mouth daily.  . Tiotropium Bromide-Olodaterol (STIOLTO RESPIMAT) 2.5-2.5 MCG/ACT AERS Inhale into the lungs.   Current Facility-Administered Medications on File Prior to Visit  Medication  . 0.9 %  sodium chloride infusion     Chief Complaint  Patient presents with  . Follow-up    2 month follow up      Pulmonary tests Spirometry 01/09/09 >> FEV1 2.20 (86%), FEV1% 70 Low dose CT chest 02/03/16 >> moderate centrilobular emphysema, scattered nodules PFT 02/09/16 >> FEV1 1.46 (53%), FEV1% 61, TLC 6.13 (114%), DLCO 63%  Past medical history GERD, Rhinitis  Past surgical history, Family history, Social history, Allergies all reviewed.  Vital Signs BP 106/68 (BP Location: Left Arm, Patient Position: Sitting, Cuff Size: Normal)   Pulse 80   Ht 5' 4.5" (1.638 m)   Wt 101 lb 12.8 oz (46.2 kg)   SpO2 97%   BMI 17.20 kg/m   History of Present Illness Robin Rivera is a 61 y.o. female smoker with COPD.  She quit smoking 2 weeks ago!!!  She remains on bupropion.  She is feeling much better.  She is able to smelling things and taste things again.  Her main difficulty is when she goes home and others are smoking.    Her breathing feels better, and she can take deeper breaths.  Physical Exam  General - pleasant ENT - wears dentures, no sinus tenderness, no oral exudate, no LAN Cardiac - regular, no murmur Chest - no  wheez/rales Back - no tenderness Abd - soft, non tender Ext - no edema Neuro - normal strength Skin - no rashes Psych - normal mood   Assessment/Plan  COPD with emphysema. - continue stiolto and prn albuterol for now  Tobacco abuse. - continue bupropion for now - asked her to call office in few weeks and then we can discuss how to transition of bupropion, assuming she remains off cigarettes   Patient Instructions  Congratulations on stopping smoking!!!!  Call when you are ready to transition off bupropion  Follow up in 6 months    Chesley Mires, MD [facility] Pager:  [phone] 07/13/2016, 4:02 PM

## 2016-07-15 ENCOUNTER — Encounter: Payer: Self-pay | Admitting: Internal Medicine

## 2016-07-28 ENCOUNTER — Ambulatory Visit (AMBULATORY_SURGERY_CENTER): Payer: 59 | Admitting: Internal Medicine

## 2016-07-28 ENCOUNTER — Encounter: Payer: Self-pay | Admitting: Internal Medicine

## 2016-07-28 VITALS — BP 100/60 | HR 68 | Temp 98.6°F | Resp 22 | Ht 64.0 in | Wt 104.0 lb

## 2016-07-28 DIAGNOSIS — D124 Benign neoplasm of descending colon: Secondary | ICD-10-CM | POA: Diagnosis not present

## 2016-07-28 DIAGNOSIS — Z1212 Encounter for screening for malignant neoplasm of rectum: Secondary | ICD-10-CM

## 2016-07-28 DIAGNOSIS — K219 Gastro-esophageal reflux disease without esophagitis: Secondary | ICD-10-CM | POA: Diagnosis not present

## 2016-07-28 DIAGNOSIS — D123 Benign neoplasm of transverse colon: Secondary | ICD-10-CM | POA: Diagnosis not present

## 2016-07-28 DIAGNOSIS — Z1211 Encounter for screening for malignant neoplasm of colon: Secondary | ICD-10-CM

## 2016-07-28 DIAGNOSIS — K621 Rectal polyp: Secondary | ICD-10-CM

## 2016-07-28 DIAGNOSIS — J449 Chronic obstructive pulmonary disease, unspecified: Secondary | ICD-10-CM | POA: Diagnosis not present

## 2016-07-28 DIAGNOSIS — D128 Benign neoplasm of rectum: Secondary | ICD-10-CM

## 2016-07-28 MED ORDER — SODIUM CHLORIDE 0.9 % IV SOLN: 500.0000 mL | INTRAVENOUS | Status: DC

## 2016-07-28 NOTE — Progress Notes (Signed)
Called to room to assist during endoscopic procedure.  Patient ID and intended procedure confirmed with present staff. Received instructions for my participation in the procedure from the performing physician.  

## 2016-07-28 NOTE — Patient Instructions (Signed)
Discharge instructions given. Handouts on polyps and diverticulosis. Resume previous medications. YOU HAD AN ENDOSCOPIC PROCEDURE TODAY AT THE [facility]:   Refer to the procedure report that was given to you for any specific questions about what was found during the examination.  If the procedure report does not answer your questions, please call your gastroenterologist to clarify.  If you requested that your care partner not be given the details of your procedure findings, then the procedure report has been included in a sealed envelope for you to review at your convenience later.  YOU SHOULD EXPECT: Some feelings of bloating in the abdomen. Passage of more gas than usual.  Walking can help get rid of the air that was put into your GI tract during the procedure and reduce the bloating. If you had a lower endoscopy (such as a colonoscopy or flexible sigmoidoscopy) you may notice spotting of blood in your stool or on the toilet paper. If you underwent a bowel prep for your procedure, you may not have a normal bowel movement for a few days.  Please Note:  You might notice some irritation and congestion in your nose or some drainage.  This is from the oxygen used during your procedure.  There is no need for concern and it should clear up in a day or so.  SYMPTOMS TO REPORT IMMEDIATELY:   Following lower endoscopy (colonoscopy or flexible sigmoidoscopy):  Excessive amounts of blood in the stool  Significant tenderness or worsening of abdominal pains  Swelling of the abdomen that is new, acute  Fever of 100F or higher   For urgent or emergent issues, a gastroenterologist can be reached at any hour by calling [phone].   DIET:  We do recommend a small meal at first, but then you may proceed to your regular diet.  Drink plenty of fluids but you should avoid alcoholic beverages for 24 hours.  ACTIVITY:  You should plan to take it easy for the rest of today and you should NOT  DRIVE or use heavy machinery until tomorrow (because of the sedation medicines used during the test).    FOLLOW UP: Our staff will call the number listed on your records the next business day following your procedure to check on you and address any questions or concerns that you may have regarding the information given to you following your procedure. If we do not reach you, we will leave a message.  However, if you are feeling well and you are not experiencing any problems, there is no need to return our call.  We will assume that you have returned to your regular daily activities without incident.  If any biopsies were taken you will be contacted by phone or by letter within the next 1-3 weeks.  Please call us at [phone] if you have not heard about the biopsies in 3 weeks.    SIGNATURES/CONFIDENTIALITY: You and/or your care partner have signed paperwork which will be entered into your electronic medical record.  These signatures attest to the fact that that the information above on your After Visit Summary has been reviewed and is understood.  Full responsibility of the confidentiality of this discharge information lies with you and/or your care-partner. 

## 2016-07-28 NOTE — Progress Notes (Signed)
Pt's states no medical or surgical changes since previsit or office visit. 

## 2016-07-28 NOTE — Op Note (Addendum)
[facility] Patient Name: Robin Rivera Procedure Date: 07/28/2016 11:49 AM MRN: [id number] Endoscopist: Docia Chuck. Henrene Pastor , MD Age: 61 Referring MD:  Date of Birth: January 07, 1956 Gender: Female Account #: [id number] Procedure:                Colonoscopy, with cold snare polypectomy X4 Indications:              Screening for colorectal malignant neoplasm Medicines:                Monitored Anesthesia Care Procedure:                Pre-Anesthesia Assessment:                           - Prior to the procedure, a History and Physical                            was performed, and patient medications and                            allergies were reviewed. The patient's tolerance of                            previous anesthesia was also reviewed. The risks                            and benefits of the procedure and the sedation                            options and risks were discussed with the patient.                            All questions were answered, and informed consent                            was obtained. Prior Anticoagulants: The patient has                            taken no previous anticoagulant or antiplatelet                            agents. ASA Grade Assessment: II - A patient with                            mild systemic disease. After reviewing the risks                            and benefits, the patient was deemed in                            satisfactory condition to undergo the procedure.                           After obtaining informed consent, the colonoscope  was passed under direct vision. Throughout the                            procedure, the patient's blood pressure, pulse, and                            oxygen saturations were monitored continuously. The                            Model PCF-[id number]) scope was introduced                            through the anus and advanced to the the cecum,                identified by appendiceal orifice and ileocecal                            valve. The ileocecal valve, appendiceal orifice,                            and rectum were photographed. The quality of the                            bowel preparation was excellent. The colonoscopy                            was performed without difficulty. The patient                            tolerated the procedure well. The bowel preparation                            used was SUPREP. Scope In: 11:57:51 AM Scope Out: 12:22:15 PM Scope Withdrawal Time: 0 hours 19 minutes 23 seconds  Total Procedure Duration: 0 hours 24 minutes 24 seconds  Findings:                 Four polyps were found in the rectum, descending                            colon and transverse colon. The polyps were 3 to 7                            mm in size. These polyps were removed with a cold                            snare. Resection and retrieval were complete.                           A few small-mouthed diverticula were found in the                            sigmoid colon.  The exam was otherwise without abnormality on                            direct and retroflexion views. Complications:            No immediate complications. Estimated blood loss:                            None. Estimated Blood Loss:     Estimated blood loss: none. Impression:               - Four 3 to 7 mm polyps in the rectum, in the                            descending colon and in the transverse colon,                            removed with a cold snare. Resected and retrieved.                           - Diverticulosis in the sigmoid colon.                           - The examination was otherwise normal on direct                            and retroflexion views. Recommendation:           - Repeat colonoscopy in 3 years for surveillance.                           - Patient has a contact number available for                             emergencies. The signs and symptoms of potential                            delayed complications were discussed with the                            patient. Return to normal activities tomorrow.                            Written discharge instructions were provided to the                            patient.                           - Resume previous diet.                           - Continue present medications.                           - Await pathology results. Docia Chuck. Henrene Pastor, MD 07/28/2016 12:27:50 PM  This report has been signed electronically.

## 2016-07-28 NOTE — Progress Notes (Signed)
Report to PACU, RN, vss, BBS= Clear.  

## 2016-07-29 ENCOUNTER — Telehealth: Payer: Self-pay | Admitting: *Deleted

## 2016-07-29 NOTE — Telephone Encounter (Signed)
  Follow up Call-  Call back number 07/28/2016 04/21/2016  Post procedure Call Back phone  # [phone]  Permission to leave phone message Yes Yes  Some recent data might be hidden     Patient questions:  Do you have a fever, pain , or abdominal swelling? No. Pain Score  0 *  Have you tolerated food without any problems? Yes.    Have you been able to return to your normal activities? Yes.    Do you have any questions about your discharge instructions: Diet   No. Medications  No. Follow up visit  No.  Do you have questions or concerns about your Care? No.  Actions: * If pain score is 4 or above: No action needed, pain <4.

## 2016-08-03 ENCOUNTER — Telehealth: Payer: 59 | Admitting: Nurse Practitioner

## 2016-08-03 ENCOUNTER — Encounter: Payer: Self-pay | Admitting: Internal Medicine

## 2016-08-03 ENCOUNTER — Other Ambulatory Visit: Payer: Self-pay | Admitting: Nurse Practitioner

## 2016-08-03 DIAGNOSIS — N390 Urinary tract infection, site not specified: Secondary | ICD-10-CM | POA: Diagnosis not present

## 2016-08-03 MED ORDER — NITROFURANTOIN MONOHYD MACRO 100 MG PO CAPS: 100.0000 mg | ORAL_CAPSULE | Freq: Two times a day (BID) | ORAL | 0 refills | Status: AC

## 2016-08-03 MED FILL — NITROFURANTOIN MONO-MCR 100: 100 | 5 days supply | Qty: 10 | Fill #0

## 2016-08-03 NOTE — Progress Notes (Signed)

## 2016-09-22 DIAGNOSIS — M810 Age-related osteoporosis without current pathological fracture: Secondary | ICD-10-CM | POA: Insufficient documentation

## 2016-09-22 DIAGNOSIS — E559 Vitamin D deficiency, unspecified: Secondary | ICD-10-CM | POA: Insufficient documentation

## 2016-09-22 NOTE — Progress Notes (Signed)
Office Visit Note  Patient: Robin Rivera             Date of Birth: [birth year]           MRN: [id number]             PCP: Mauricio Po, FNP Referring: Salvadore Dom, MD Visit Date: 09/26/2016 Occupation: @GUAROCC @    Subjective:  Treatment of osteoporosis   History of Present Illness: Robin Rivera is a 61 y.o. female seen in consultation per request of Dr. Talbert Nan for evaluation of osteoporosis. Patient had a recent bone density which was consistent with osteoporosis. She denies any history of fracture. She denies any family history of osteoporosis. She has long-standing history of gastroesophageal reflux with esophagitis. This 40 years some history of smoking. She tried to quit smoking for one week but restarted due to stress. She denies any joint pain.  Activities of Daily Living:  Patient reports morning stiffness for 0 minute.   Patient Denies nocturnal pain.  Difficulty dressing/grooming: Denies Difficulty climbing stairs: Denies Difficulty getting out of chair: Denies Difficulty using hands for taps, buttons, cutlery, and/or writing: Denies   Review of Systems  Constitutional: Negative for fatigue, night sweats, weight gain, weight loss and weakness.  HENT: Negative for mouth sores, trouble swallowing, trouble swallowing, mouth dryness and nose dryness.   Eyes: Negative for pain, redness, visual disturbance and dryness.  Respiratory: Positive for wheezing. Negative for cough, shortness of breath and difficulty breathing.        COPD  Cardiovascular: Negative for chest pain, palpitations, hypertension, irregular heartbeat and swelling in legs/feet.  Gastrointestinal: Positive for abdominal pain and heartburn. Negative for blood in stool, constipation and diarrhea.  Endocrine: Negative for increased urination.  Genitourinary: Negative for vaginal dryness.  Musculoskeletal: Negative for arthralgias, joint pain, joint swelling, myalgias, muscle weakness,  morning stiffness, muscle tenderness and myalgias.  Skin: Negative for color change, rash, hair loss, skin tightness, ulcers and sensitivity to sunlight.  Allergic/Immunologic: Negative for susceptible to infections.  Neurological: Negative for dizziness, memory loss and night sweats.  Hematological: Negative for swollen glands.  Psychiatric/Behavioral: Negative for depressed mood and sleep disturbance. The patient is nervous/anxious.     PMFS History:  Patient Active Problem List   Diagnosis Date Noted  . Age-related osteoporosis without current pathological fracture 09/22/2016  . Vitamin D deficiency 09/22/2016  . Routine general medical examination at a health care facility 05/02/2016  . Underweight 05/02/2016  . GERD (gastroesophageal reflux disease) 03/29/2016  . Eye irritation 02/15/2016  . Dysphagia 02/04/2016  . Atypical chest pain 02/04/2016  . Coronary artery calcification seen on CAT scan 02/04/2016  . Chronic obstructive pulmonary disease ([place]) 08/16/2013  . TOBACCO ABUSE 01/09/2009    Past Medical History:  Diagnosis Date  . COPD (chronic obstructive pulmonary disease) ([place])   . Esophageal stricture   . GERD (gastroesophageal reflux disease)   . Osteoporosis   . Rhinitis   . Tobacco abuse     Family History  Problem Relation Age of Onset  . Adopted: Yes  . Colon cancer Maternal Aunt    Past Surgical History:  Procedure Laterality Date  . BREAST SURGERY Bilateral    lumpectomy   . CESAREAN SECTION    . TONSILLECTOMY    . TOTAL ABDOMINAL HYSTERECTOMY     Partial hysterectomy   Social History   Social History Narrative   Fun: Play games on her computer, bon fires   Denies abuse and feels  safe at home.      Objective: Vital Signs: BP 100/60 (BP Location: Right Arm)   Pulse 68   Resp 14   Ht 5\' 5"  (1.651 m)   Wt 104 lb (47.2 kg)   BMI 17.31 kg/m    Physical Exam  Constitutional: She is oriented to person, place, and time. She appears  well-developed and well-nourished.  HENT:  Head: Normocephalic and atraumatic.  Eyes: Conjunctivae and EOM are normal.  Neck: Normal range of motion.  Cardiovascular: Normal rate, regular rhythm, normal heart sounds and intact distal pulses.   Pulmonary/Chest: Effort normal and breath sounds normal.  Abdominal: Soft. Bowel sounds are normal.  Lymphadenopathy:    She has no cervical adenopathy.  Neurological: She is alert and oriented to person, place, and time.  Skin: Skin is warm and dry. Capillary refill takes less than 2 seconds.  Psychiatric: She has a normal mood and affect. Her behavior is normal.  Nursing note and vitals reviewed.    Musculoskeletal Exam: C-spine and thoracic lumbar spine good range of motion. No thoracic kyphosis was noted. She has good posture. Shoulder joints elbow joints wrist joint MCPs PIPs DIPs with good range of motion with no synovitis.  CDAI Exam: No CDAI exam completed.    Investigation: Findings:  06/08/2016 DEXA T score -3.6 lumbar. 05/02/2016 hep C negative LDL 110 CMP alkaline phosphatase 128, CBC normal 07/04/2016 PTH normal TSH normal vitamin D 35,24 hour urine calcium normal, CMP normal    Imaging: No results found.  Speciality Comments: No specialty comments available.    Procedures:  No procedures performed Allergies: Patient has no known allergies.   Assessment / Plan:     Visit Diagnoses: Age-related osteoporosis without current pathological fracture - 06/08/2016 DEXA T score -3.6 lumbar, T score -3.5. Different treatment options and their side effects were discussed at length. As she has some history of reflux she cannot take any oral bisphosphonates. We discussed the option of IV Reclast. Indications side effects contraindications were discussed at length. Given IV Reclast. We will obtain CBC and CMP today and we'll schedule her for IV Reclast. She's been taking calcium and vitamin D on a regular basis.  Vitamin D deficiency:  Her vitamin D is a still in the lower range of advised her to take vitamin D 2000 units over-the-counter daily.  TOBACCO ABUSE: Association of the smoking and osteoporosis was discussed at length. Is smoking safely was also emphasized.  Chronic obstructive pulmonary disease, unspecified COPD type ([place])  Gastroesophageal reflux disease with esophagitis  Coronary artery calcification seen on CAT scan  Underweight : Need for regular exercise and muscle strengthening was discussed.   Orders: Orders Placed This Encounter  Procedures  . Serum protein electrophoresis with reflex  . Gliadin antibodies, serum  . Tissue transglutaminase, IgA  . Sedimentation rate   No orders of the defined types were placed in this encounter.   Face-to-face time spent with patient was 45 minutes. 50% of time was spent in counseling and coordination of care.  Follow-Up Instructions: Return for Osteoporosis.   Bo Merino, MD  Note - This record has been created using Editor, commissioning.  Chart creation errors have been sought, but may not always  have been located. Such creation errors do not reflect on  the standard of medical care.

## 2016-09-26 ENCOUNTER — Encounter: Payer: Self-pay | Admitting: Rheumatology

## 2016-09-26 ENCOUNTER — Ambulatory Visit (INDEPENDENT_AMBULATORY_CARE_PROVIDER_SITE_OTHER): Payer: 59 | Admitting: Rheumatology

## 2016-09-26 ENCOUNTER — Telehealth: Payer: Self-pay

## 2016-09-26 VITALS — BP 100/60 | HR 68 | Resp 14 | Ht 65.0 in | Wt 104.0 lb

## 2016-09-26 DIAGNOSIS — K2100 Gastro-esophageal reflux disease with esophagitis, without bleeding: Secondary | ICD-10-CM

## 2016-09-26 DIAGNOSIS — M810 Age-related osteoporosis without current pathological fracture: Secondary | ICD-10-CM | POA: Diagnosis not present

## 2016-09-26 DIAGNOSIS — F17200 Nicotine dependence, unspecified, uncomplicated: Secondary | ICD-10-CM | POA: Diagnosis not present

## 2016-09-26 DIAGNOSIS — J449 Chronic obstructive pulmonary disease, unspecified: Secondary | ICD-10-CM

## 2016-09-26 DIAGNOSIS — R636 Underweight: Secondary | ICD-10-CM | POA: Diagnosis not present

## 2016-09-26 DIAGNOSIS — I2510 Atherosclerotic heart disease of native coronary artery without angina pectoris: Secondary | ICD-10-CM

## 2016-09-26 DIAGNOSIS — E559 Vitamin D deficiency, unspecified: Secondary | ICD-10-CM | POA: Diagnosis not present

## 2016-09-26 LAB — COMPLETE METABOLIC PANEL WITH GFR
ALK PHOS: 116 U/L (ref 33–130)
ALT: 7 U/L (ref 6–29)
AST: 12 U/L (ref 10–35)
Albumin: 4.3 g/dL (ref 3.6–5.1)
BILIRUBIN TOTAL: 0.3 mg/dL (ref 0.2–1.2)
BUN: 12 mg/dL (ref 7–25)
CO2: 27 mmol/L (ref 20–31)
Calcium: 9.6 mg/dL (ref 8.6–10.4)
Chloride: 106 mmol/L (ref 98–110)
Creat: 0.79 mg/dL (ref 0.50–0.99)
GFR, EST NON AFRICAN AMERICAN: 82 mL/min (ref >=60)
GFR, Est African American: >89 mL/min (ref >=60)
Glucose, Bld: 81 mg/dL (ref 65–99)
POTASSIUM: 4.2 mmol/L (ref 3.5–5.3)
SODIUM: 140 mmol/L (ref 135–146)
TOTAL PROTEIN: 7.1 g/dL (ref 6.1–8.1)

## 2016-09-26 LAB — CBC WITH DIFFERENTIAL/PLATELET
BASOS ABS: 58 {cells}/uL (ref 0–200)
Basophils Relative: 1 %
EOS PCT: 2 %
Eosinophils Absolute: 116 cells/uL (ref 15–500)
HCT: 43 % (ref 35.0–45.0)
Hemoglobin: 14.3 g/dL (ref 11.7–15.5)
Lymphocytes Relative: 27 %
Lymphs Abs: 1566 cells/uL (ref 850–3900)
MCH: 30.4 pg (ref 27.0–33.0)
MCHC: 33.3 g/dL (ref 32.0–36.0)
MCV: 91.5 fL (ref 80.0–100.0)
MONOS PCT: 7 %
MPV: 9.2 fL (ref 7.5–12.5)
Monocytes Absolute: 406 cells/uL (ref 200–950)
NEUTROS ABS: 3654 {cells}/uL (ref 1500–7800)
Neutrophils Relative %: 63 %
PLATELETS: 284 10*3/uL (ref 140–400)
RBC: 4.7 MIL/uL (ref 3.80–5.10)
RDW: 14.3 % (ref 11.0–15.0)
WBC: 5.8 10*3/uL (ref 3.8–10.8)

## 2016-09-26 NOTE — Telephone Encounter (Signed)
Spoke with [place], a representative from [facility] to verify the benefits for a Reclast Infusion. She states that the service will not require a pre-certification. She has met her $250 deductible. Insurance will cover 80% of the cost of the service and she will be responsible for the remaining 20%.   Reference number: [id number] Phone number: [phone]  Demetrios Loll, CPhT 9:38 AM

## 2016-09-26 NOTE — Patient Instructions (Signed)

## 2016-09-26 NOTE — Progress Notes (Signed)
Pharmacy Note  Subjective: Patient presents today to the [facility] to see Dr. Estanislado Pandy.  Patient seen by pharmacist for counseling on bisphosphonate therapy.  Patient has history of GERD.  Intraveneous bisphosphonate is felt to be a better option for this patient.    Objective: T-score: -3.6 (06/07/16) Calcium: 9.9 mg/dL (07/04/16) - updated level ordered today  Vitamin D: 35 ng/mL (07/04/16)  Assessment/Plan: Counseled patient that zoledronic acid (Reclast) is an intraveneous bisphosphonate that reduces bone turnover by inhibiting osteoclasts that chew up bone.  Counseled patient on purpose, proper use, and adverse effects of Reclast.  Reviewed with patient that Reclast should be taken once a year.  Reviewed importance of taking calcium and vitamin D with bisphosphonate therapy.  Patient confirms she is already taking calcium/vitamin D.  Provided patient with medication education material and answered all questions.  Reviewed adverse events of Reclast including risk of flu-like symptoms, nausea & diarrhea, headache, and muscle & bone pain.  Reviewed rare adverse effect of osteonecrosis of the jaw and advised patient to alert her dentist that she is on Reclast prior to any major dental work.  Patient confirms she does not have any major dental work scheduled at this time.  Patient agrees to trial of Reclast at this time.     Elisabeth Most, Pharm.D., BCPS Clinical Pharmacist Pager: [phone] Phone: [phone] 09/26/2016 8:55 AM

## 2016-09-26 NOTE — Telephone Encounter (Signed)
I called patient to review this information with her.  I left a message on her answering machine asking her to call me back.    Elisabeth Most, Pharm.D., BCPS, CPP Clinical Pharmacist Pager: [phone] Phone: [phone] 09/26/2016 4:41 PM

## 2016-09-27 ENCOUNTER — Telehealth: Payer: Self-pay | Admitting: Rheumatology

## 2016-09-27 LAB — SEDIMENTATION RATE: Sed Rate: 1 mm/hr (ref 0–30)

## 2016-09-27 NOTE — Telephone Encounter (Signed)
Advised patient CBC and CMP were normal.  We are still waiting on other labs to result.    Elisabeth Most, Pharm.D., BCPS, CPP Clinical Pharmacist Pager: [phone] Phone: [phone] 09/27/2016 9:38 AM

## 2016-09-27 NOTE — Telephone Encounter (Signed)
Patient returned Rachel's call.  CB#[phone] (w).  Thank you.

## 2016-09-27 NOTE — Telephone Encounter (Signed)
I spoke to patient and informed her of the benefits verification.  Patient voiced understanding and denies any questions.

## 2016-09-28 LAB — PROTEIN ELECTROPHORESIS, SERUM, WITH REFLEX
ABNORMAL PROTEIN BAND1: 0.2 g/dL
Albumin ELP: 4.2 g/dL (ref 3.8–4.8)
Alpha-1-Globulin: 0.3 g/dL (ref 0.2–0.3)
Alpha-2-Globulin: 0.8 g/dL (ref 0.5–0.9)
BETA GLOBULIN: 0.4 g/dL (ref 0.4–0.6)
Beta 2: 0.3 g/dL (ref 0.2–0.5)
Gamma Globulin: 1 g/dL (ref 0.8–1.7)
TOTAL PROTEIN, SERUM ELECTROPHOR: 7.1 g/dL (ref 6.1–8.1)

## 2016-09-28 LAB — IFE INTERPRETATION

## 2016-09-29 LAB — GLIADIN ANTIBODIES, SERUM
GLIADIN IGG: 37 U — AB (ref <20)
Gliadin IgA: 10 Units (ref <20)

## 2016-09-29 LAB — TISSUE TRANSGLUTAMINASE, IGA: TISSUE TRANSGLUTAMINASE AB, IGA: 1 U/mL (ref <4)

## 2016-09-30 NOTE — Progress Notes (Signed)
WNL

## 2016-09-30 NOTE — Telephone Encounter (Signed)
Encounter created in error

## 2016-10-22 DIAGNOSIS — D472 Monoclonal gammopathy: Secondary | ICD-10-CM | POA: Insufficient documentation

## 2016-10-22 NOTE — Progress Notes (Signed)
Office Visit Note  Patient: Robin Rivera             Date of Birth: 1955-06-26           MRN: [id number]             PCP: Golden Circle, FNP Referring: Golden Circle, FNP Visit Date: 10/27/2016 Occupation: _0 @    Subjective:  Fu on osteoporosis  History of Present Illness: Robin Rivera is a 61 y.o. female  with osteoporosis. She returns for follow-up visit today she states she continues to be doing well. She's been exercising and taking calcium and vitamin D. She is a still smoking.  Activities of Daily Living:  Patient reports morning stiffness for 0 minutes.   Patient Denies nocturnal pain.  Difficulty dressing/grooming: Denies Difficulty climbing stairs: Denies Difficulty getting out of chair: Denies Difficulty using hands for taps, buttons, cutlery, and/or writing: Denies   Review of Systems  Constitutional: Negative for fatigue, night sweats, weight gain, weight loss and weakness.  HENT: Negative for mouth sores, mouth dryness and nose dryness.   Eyes: Negative for pain, redness, visual disturbance and dryness.  Respiratory: Negative for cough, shortness of breath and difficulty breathing.   Cardiovascular: Negative for chest pain, palpitations, hypertension, irregular heartbeat and swelling in legs/feet.  Gastrointestinal: Negative for blood in stool, constipation and diarrhea.  Endocrine: Negative for increased urination.  Genitourinary: Negative for painful urination.  Musculoskeletal: Negative for arthralgias, joint pain, joint swelling, myalgias, morning stiffness and myalgias.  Skin: Negative for color change, rash, nodules/bumps, redness, skin tightness, ulcers and sensitivity to sunlight.  Allergic/Immunologic: Negative for susceptible to infections.  Neurological: Negative for dizziness, headaches and night sweats.  Hematological: Negative for swollen glands.  Psychiatric/Behavioral: Negative for depressed mood and sleep disturbance. The  patient is nervous/anxious.     PMFS History:  Patient Active Problem List   Diagnosis Date Noted  . Monoclonal gammopathy 10/22/2016  . Age-related osteoporosis without current pathological fracture 09/22/2016  . Vitamin D deficiency 09/22/2016  . Routine general medical examination at a health care facility 05/02/2016  . Underweight 05/02/2016  . GERD (gastroesophageal reflux disease) 03/29/2016  . Eye irritation 02/15/2016  . Dysphagia 02/04/2016  . Atypical chest pain 02/04/2016  . Coronary artery calcification seen on CAT scan 02/04/2016  . Chronic obstructive pulmonary disease ([place]) 08/16/2013  . TOBACCO ABUSE 01/09/2009    Past Medical History:  Diagnosis Date  . COPD (chronic obstructive pulmonary disease) ([place])   . Esophageal stricture   . GERD (gastroesophageal reflux disease)   . Osteoporosis   . Rhinitis   . Tobacco abuse     Family History  Problem Relation Age of Onset  . Adopted: Yes  . Colon cancer Maternal Aunt    Past Surgical History:  Procedure Laterality Date  . BREAST SURGERY Bilateral    lumpectomy   . CESAREAN SECTION    . TONSILLECTOMY    . TOTAL ABDOMINAL HYSTERECTOMY     Partial hysterectomy   Social History   Social History Narrative   Fun: Play games on her computer, bon fires   Denies abuse and feels safe at home.      Objective: Vital Signs: BP 102/64   Pulse 64   Resp 14   Ht _1  (1.651 m)   Wt 104 lb (47.2 kg)   BMI 17.31 kg/m    Physical Exam  Constitutional: She is oriented to person, place, and time. She appears well-developed and  well-nourished.  HENT:  Head: Normocephalic and atraumatic.  Eyes: Conjunctivae and EOM are normal.  Neck: Normal range of motion.  Cardiovascular: Normal rate, regular rhythm, normal heart sounds and intact distal pulses.   Pulmonary/Chest: Effort normal and breath sounds normal.  Abdominal: Soft. Bowel sounds are normal.  Lymphadenopathy:    She has no cervical adenopathy.    Neurological: She is alert and oriented to person, place, and time.  Skin: Skin is warm and dry. Capillary refill takes less than 2 seconds.  Psychiatric: She has a normal mood and affect. Her behavior is normal.  Nursing note and vitals reviewed.    Musculoskeletal Exam: C-spine and thoracic lumbar spine good range of motion. Shoulder joints elbow joints wrist joint MCPs PIPs DIPs are good range of motion with no synovitis. Hip joints knee joints ankles MTPs PIPs with good range of motion with no synovitis.  CDAI Exam: No CDAI exam completed.    Investigation: No additional findings. 09/26/2016 CBC normal, CMP normal, antigliadin antibody elevated at 37, anti-tTG negative, ESR 1, IFE IgM lambda monoclonal band present  Imaging: No results found.  Speciality Comments: No specialty comments available.    Procedures:  No procedures performed Allergies: Patient has no known allergies.   Assessment / Plan:     Visit Diagnoses: Age-related osteoporosis without current pathological fracture - Plan IV Reclast . Indications side effects contraindications were discussed. She wants to proceed with Reclast. We will schedule Reclast after CMP.- Plan: COMPLETE METABOLIC PANEL WITH GFR  Vitamin D deficiency: Her last vitamin D was within normal limits she's been taking calcium and vitamin D  TOBACCO ABUSE: The smoking cessation was discussed at length.  Chronic obstructive pulmonary disease, unspecified COPD type ([place]): Stable  Coronary artery calcification seen on CAT scan  Gastroesophageal reflux disease with esophagitis  Monoclonal gammopathy - Will refer to hematology  Medication monitoring encounter - Plan: COMPLETE METABOLIC PANEL WITH GFR    Orders: Orders Placed This Encounter  Procedures  . COMPLETE METABOLIC PANEL WITH GFR  . Ambulatory referral to Hematology   No orders of the defined types were placed in this encounter.   Face-to-face time spent with patient was  30 minutes. 50% of time was spent in counseling and coordination of care.  Follow-Up Instructions: Return in about 5 months (around 03/29/2017) for Osteoporosis.   Bo Merino, MD  Note - This record has been created using Editor, commissioning.  Chart creation errors have been sought, but may not always  have been located. Such creation errors do not reflect on  the standard of medical care.

## 2016-10-27 ENCOUNTER — Ambulatory Visit (INDEPENDENT_AMBULATORY_CARE_PROVIDER_SITE_OTHER): Payer: 59 | Admitting: Rheumatology

## 2016-10-27 ENCOUNTER — Ambulatory Visit: Payer: 59 | Admitting: Rheumatology

## 2016-10-27 ENCOUNTER — Encounter: Payer: Self-pay | Admitting: Rheumatology

## 2016-10-27 VITALS — BP 102/64 | HR 64 | Resp 14 | Ht 65.0 in | Wt 104.0 lb

## 2016-10-27 DIAGNOSIS — M810 Age-related osteoporosis without current pathological fracture: Secondary | ICD-10-CM

## 2016-10-27 DIAGNOSIS — D472 Monoclonal gammopathy: Secondary | ICD-10-CM

## 2016-10-27 DIAGNOSIS — K2100 Gastro-esophageal reflux disease with esophagitis, without bleeding: Secondary | ICD-10-CM

## 2016-10-27 DIAGNOSIS — J449 Chronic obstructive pulmonary disease, unspecified: Secondary | ICD-10-CM

## 2016-10-27 DIAGNOSIS — I2510 Atherosclerotic heart disease of native coronary artery without angina pectoris: Secondary | ICD-10-CM | POA: Diagnosis not present

## 2016-10-27 DIAGNOSIS — F17200 Nicotine dependence, unspecified, uncomplicated: Secondary | ICD-10-CM | POA: Diagnosis not present

## 2016-10-27 DIAGNOSIS — E559 Vitamin D deficiency, unspecified: Secondary | ICD-10-CM | POA: Diagnosis not present

## 2016-10-27 DIAGNOSIS — Z5181 Encounter for therapeutic drug level monitoring: Secondary | ICD-10-CM

## 2016-10-27 NOTE — Progress Notes (Signed)
Pharmacy Note  Subjective: Patient presents today to the [facility] to see Dr. Estanislado Pandy.  Patient agreed to initiation of Reclast on 09/26/16.  Patient seen by pharmacist for counseling on bisphosphonate therapy.    Objective: T-score: -3.6 (06/07/16) Calcium: 9.6 mg/dL (09/26/16) - updated level ordered today  Vitamin D: 35 ng/mL (07/04/16) Cr 0.79 (09/26/16) - updated level ordered today  Assessment/Plan: Counseled patient that zoledronic acid (Reclast) is an intraveneous bisphosphonate that reduces bone turnover by inhibiting osteoclasts that chew up bone.  Counseled patient on purpose, proper use, and adverse effects of Reclast.  Reviewed with patient that Reclast should be taken once a year.  Reviewed importance of taking calcium and vitamin D with bisphosphonate therapy and doing weight bearing exercises.  Patient confirms she is already taking calcium/vitamin D.  Provided patient with medication education material and answered all questions.  Reviewed adverse events of Reclast including risk of flu-like symptoms, nausea & diarrhea, headache, and muscle & bone pain.  Reviewed rare adverse effect of osteonecrosis of the jaw and advised patient to alert her dentist that she is on Reclast prior to any major dental work.  Patient confirms she does not have any major dental work scheduled at this time.  Patient agrees to trial of Reclast at this time.    Elisabeth Most, Pharm.D., BCPS Clinical Pharmacist Pager: [phone] Phone: [phone] 10/27/2016 3:43 PM

## 2016-10-27 NOTE — Patient Instructions (Signed)
Once your infusion orders are placed, you can call [phone] to schedule your infusion  Zoledronic Acid injection (Paget's Disease, Osteoporosis) What is this medicine? ZOLEDRONIC ACID (ZOE le dron ik AS id) lowers the amount of calcium loss from bone. It is used to treat Paget's disease and osteoporosis in women. This medicine may be used for other purposes; ask your health care provider or pharmacist if you have questions. COMMON BRAND NAME(S): Reclast, Zometa What should I tell my health care provider before I take this medicine? They need to know if you have any of these conditions: -aspirin-sensitive asthma -cancer, especially if you are receiving medicines used to treat cancer -dental disease or wear dentures -infection -kidney disease -low levels of calcium in the blood -past surgery on the parathyroid gland or intestines -receiving corticosteroids like dexamethasone or prednisone -an unusual or allergic reaction to zoledronic acid, other medicines, foods, dyes, or preservatives -pregnant or trying to get pregnant -breast-feeding How should I use this medicine? This medicine is for infusion into a vein. It is given by a health care professional in a hospital or clinic setting. Talk to your pediatrician regarding the use of this medicine in children. This medicine is not approved for use in children. Overdosage: If you think you have taken too much of this medicine contact a poison control center or emergency room at once. NOTE: This medicine is only for you. Do not share this medicine with others. What if I miss a dose? It is important not to miss your dose. Call your doctor or health care professional if you are unable to keep an appointment. What may interact with this medicine? -certain antibiotics given by injection -NSAIDs, medicines for pain and inflammation, like ibuprofen or naproxen -some diuretics like bumetanide, furosemide -teriparatide This list may not describe  all possible interactions. Give your health care provider a list of all the medicines, herbs, non-prescription drugs, or dietary supplements you use. Also tell them if you smoke, drink alcohol, or use illegal drugs. Some items may interact with your medicine. What should I watch for while using this medicine? Visit your doctor or health care professional for regular checkups. It may be some time before you see the benefit from this medicine. Do not stop taking your medicine unless your doctor tells you to. Your doctor may order blood tests or other tests to see how you are doing. Women should inform their doctor if they wish to become pregnant or think they might be pregnant. There is a potential for serious side effects to an unborn child. Talk to your health care professional or pharmacist for more information. You should make sure that you get enough calcium and vitamin D while you are taking this medicine. Discuss the foods you eat and the vitamins you take with your health care professional. Some people who take this medicine have severe bone, joint, and/or muscle pain. This medicine may also increase your risk for jaw problems or a broken thigh bone. Tell your doctor right away if you have severe pain in your jaw, bones, joints, or muscles. Tell your doctor if you have any pain that does not go away or that gets worse. Tell your dentist and dental surgeon that you are taking this medicine. You should not have major dental surgery while on this medicine. See your dentist to have a dental exam and fix any dental problems before starting this medicine. Take good care of your teeth while on this medicine. Make sure you see your  dentist for regular follow-up appointments. What side effects may I notice from receiving this medicine? Side effects that you should report to your doctor or health care professional as soon as possible: -allergic reactions like skin rash, itching or hives, swelling of the face,  lips, or tongue -anxiety, confusion, or depression -breathing problems -changes in vision -eye pain -feeling faint or lightheaded, falls -jaw pain, especially after dental work -mouth sores -muscle cramps, stiffness, or weakness -redness, blistering, peeling or loosening of the skin, including inside the mouth -trouble passing urine or change in the amount of urine Side effects that usually do not require medical attention (report to your doctor or health care professional if they continue or are bothersome): -bone, joint, or muscle pain -constipation -diarrhea -fever -hair loss -irritation at site where injected -loss of appetite -nausea, vomiting -stomach upset -trouble sleeping -trouble swallowing -weak or tired This list may not describe all possible side effects. Call your doctor for medical advice about side effects. You may report side effects to FDA at 1-800-FDA-1088. Where should I keep my medicine? This drug is given in a hospital or clinic and will not be stored at home. NOTE: This sheet is a summary. It may not cover all possible information. If you have questions about this medicine, talk to your doctor, pharmacist, or health care provider.  2018 Elsevier/Gold Standard (2013-11-02 14:19:57)

## 2016-10-28 ENCOUNTER — Other Ambulatory Visit: Payer: Self-pay | Admitting: Radiology

## 2016-10-28 DIAGNOSIS — M810 Age-related osteoporosis without current pathological fracture: Secondary | ICD-10-CM

## 2016-10-28 LAB — COMPLETE METABOLIC PANEL WITH GFR
ALBUMIN: 4 g/dL (ref 3.6–5.1)
ALK PHOS: 105 U/L (ref 33–130)
ALT: 10 U/L (ref 6–29)
AST: 12 U/L (ref 10–35)
BUN: 14 mg/dL (ref 7–25)
CALCIUM: 9.2 mg/dL (ref 8.6–10.4)
CO2: 26 mmol/L (ref 20–31)
Chloride: 105 mmol/L (ref 98–110)
Creat: 0.93 mg/dL (ref 0.50–0.99)
GFR, EST AFRICAN AMERICAN: 77 mL/min (ref >=60)
GFR, EST NON AFRICAN AMERICAN: 67 mL/min (ref >=60)
Glucose, Bld: 101 mg/dL — ABNORMAL HIGH (ref 65–99)
POTASSIUM: 4.1 mmol/L (ref 3.5–5.3)
Sodium: 139 mmol/L (ref 135–146)
Total Bilirubin: 0.2 mg/dL (ref 0.2–1.2)
Total Protein: 6.6 g/dL (ref 6.1–8.1)

## 2016-10-28 NOTE — Progress Notes (Signed)
WNL, Ok to start Reclast

## 2016-10-28 NOTE — Progress Notes (Signed)
Put in infusion orders for Reclast called pt to provide number to call to schedule

## 2016-11-02 ENCOUNTER — Telehealth: Payer: Self-pay | Admitting: Rheumatology

## 2016-11-02 NOTE — Telephone Encounter (Signed)
Patient advised lab results are normal 

## 2016-11-02 NOTE — Telephone Encounter (Signed)
Patient returned a phone call to Seth Bake. Possibly about lab results? Please call patient back at her work number [phone]

## 2016-11-07 ENCOUNTER — Other Ambulatory Visit (HOSPITAL_COMMUNITY): Payer: Self-pay | Admitting: *Deleted

## 2016-11-08 ENCOUNTER — Ambulatory Visit (HOSPITAL_COMMUNITY)
Admission: RE | Admit: 2016-11-08 | Discharge: 2016-11-08 | Disposition: A | Discharge status: Home / Self Care | Payer: 59 | Source: Ambulatory Visit | Attending: Rheumatology | Admitting: Rheumatology

## 2016-11-08 ENCOUNTER — Telehealth: Payer: Self-pay | Admitting: Rheumatology

## 2016-11-08 DIAGNOSIS — M810 Age-related osteoporosis without current pathological fracture: Secondary | ICD-10-CM | POA: Insufficient documentation

## 2016-11-08 MED ORDER — ZOLEDRONIC ACID 5 MG/100ML IV SOLN
5.0000 mg | Freq: Once | INTRAVENOUS | Status: AC
  Administered 2016-11-08: 5 mg via INTRAVENOUS

## 2016-11-08 NOTE — Telephone Encounter (Signed)
[facility] calling requesting name of referring doctor to their practice. They can see all info except referring name.

## 2016-11-21 ENCOUNTER — Ambulatory Visit: Payer: 59 | Admitting: Rheumatology

## 2016-11-28 DIAGNOSIS — D472 Monoclonal gammopathy: Secondary | ICD-10-CM | POA: Diagnosis not present

## 2016-12-05 DIAGNOSIS — D472 Monoclonal gammopathy: Secondary | ICD-10-CM | POA: Diagnosis not present

## 2016-12-11 ENCOUNTER — Encounter: Payer: Self-pay | Admitting: Obstetrics and Gynecology

## 2017-02-01 MED FILL — OMEPRAZOLE DR 40 MG CAPSULE: 40 | 90 days supply | Qty: 90 | Fill #1

## 2017-02-03 ENCOUNTER — Ambulatory Visit (INDEPENDENT_AMBULATORY_CARE_PROVIDER_SITE_OTHER)
Admission: RE | Admit: 2017-02-03 | Discharge: 2017-02-03 | Disposition: A | Discharge status: Home / Self Care | Payer: 59 | Source: Ambulatory Visit | Attending: Acute Care | Admitting: Acute Care

## 2017-02-03 DIAGNOSIS — Z87891 Personal history of nicotine dependence: Secondary | ICD-10-CM | POA: Diagnosis not present

## 2017-02-03 DIAGNOSIS — F17210 Nicotine dependence, cigarettes, uncomplicated: Secondary | ICD-10-CM

## 2017-02-09 ENCOUNTER — Other Ambulatory Visit: Payer: Self-pay | Admitting: Acute Care

## 2017-02-09 DIAGNOSIS — F17210 Nicotine dependence, cigarettes, uncomplicated: Secondary | ICD-10-CM

## 2017-02-09 DIAGNOSIS — Z122 Encounter for screening for malignant neoplasm of respiratory organs: Secondary | ICD-10-CM

## 2017-02-24 DIAGNOSIS — D472 Monoclonal gammopathy: Secondary | ICD-10-CM | POA: Diagnosis not present

## 2017-03-23 NOTE — Progress Notes (Signed)
Office Visit Note  Patient: Robin Rivera             Date of Birth: Dec 04, 1955           MRN: [id number]             PCP: Golden Circle, FNP Referring: Golden Circle, FNP Visit Date: 03/30/2017 Occupation: @GUAROCC @    Subjective:  Medication management.   History of Present Illness: [name] is a 61 y.o. female with history of osteoporosis and monoclonal gammopathy. She had Reclast infusion and May 2018. She had arthralgias for 2 days. He has been taking vitamin D. She is a still smoking. She had seen a hematologist in [place] and has follow-up appointment there.  Activities of Daily Living Patient reports morning stiffness for 1  minute.   Patient Denies nocturnal pain.  Difficulty dressing/grooming: Denies Difficulty climbing stairs: Denies Difficulty getting out of chair: Denies Difficulty using hands for taps, buttons, cutlery, and/or writing: Denies   Review of Systems  Constitutional: Negative.  Negative for fatigue, night sweats, weight gain, weight loss and weakness.  HENT: Negative for mouth sores, trouble swallowing, trouble swallowing, mouth dryness and nose dryness.   Eyes: Negative for pain, redness, visual disturbance and dryness.  Respiratory: Negative.  Negative for cough, shortness of breath and difficulty breathing.   Cardiovascular: Negative.  Negative for chest pain, palpitations, hypertension, irregular heartbeat and swelling in legs/feet.  Gastrointestinal: Negative.  Negative for blood in stool, constipation and diarrhea.  Endocrine: Negative.  Negative for increased urination.  Genitourinary: Negative.  Negative for nocturia and vaginal dryness.  Musculoskeletal: Negative for arthralgias, joint pain, joint swelling, myalgias, muscle weakness, morning stiffness, muscle tenderness and myalgias.  Skin: Negative.  Negative for color change, rash, hair loss, skin tightness, ulcers and sensitivity to sunlight.  Allergic/Immunologic:  Negative for susceptible to infections.  Neurological: Negative.  Negative for dizziness, headaches, memory loss and night sweats.  Hematological: Negative.  Negative for swollen glands.  Psychiatric/Behavioral: Negative.  Negative for depressed mood and sleep disturbance. The patient is not nervous/anxious.     PMFS History:  Patient Active Problem List   Diagnosis Date Noted  . Monoclonal gammopathy 10/22/2016  . Age-related osteoporosis without current pathological fracture 09/22/2016  . Vitamin D deficiency 09/22/2016  . Routine general medical examination at a health care facility 05/02/2016  . Underweight 05/02/2016  . GERD (gastroesophageal reflux disease) 03/29/2016  . Eye irritation 02/15/2016  . Dysphagia 02/04/2016  . Atypical chest pain 02/04/2016  . Coronary artery calcification seen on CAT scan 02/04/2016  . Chronic obstructive pulmonary disease ([place]) 08/16/2013  . TOBACCO ABUSE 01/09/2009    Past Medical History:  Diagnosis Date  . COPD (chronic obstructive pulmonary disease) ([place])   . Esophageal stricture   . GERD (gastroesophageal reflux disease)   . Monoclonal gammopathy    unknown significance, followed at [facility].   . Osteoporosis   . Rhinitis   . Tobacco abuse     Family History  Problem Relation Age of Onset  . Adopted: Yes  . Colon cancer Maternal Aunt    Past Surgical History:  Procedure Laterality Date  . BREAST SURGERY Bilateral    lumpectomy   . CESAREAN SECTION    . TONSILLECTOMY    . TOTAL ABDOMINAL HYSTERECTOMY     Partial hysterectomy   Social History   Social History Narrative   Fun: Play games on her computer, bon fires   Denies abuse  and feels safe at home.      Objective: Vital Signs: BP 101/62   Pulse 82   Resp 12   Ht 5\' 5"  (1.651 m)   Wt 108 lb (49 kg)   BMI 17.97 kg/m    Physical Exam  Constitutional: She is oriented to person, place, and time. She appears well-developed and well-nourished.    HENT:  Head: Normocephalic and atraumatic.  Eyes: Conjunctivae and EOM are normal.  Neck: Normal range of motion.  Cardiovascular: Normal rate, regular rhythm, normal heart sounds and intact distal pulses.   Pulmonary/Chest: Effort normal and breath sounds normal.  Abdominal: Soft. Bowel sounds are normal.  Lymphadenopathy:    She has no cervical adenopathy.  Neurological: She is alert and oriented to person, place, and time.  Skin: Skin is warm and dry. Capillary refill takes less than 2 seconds.  Psychiatric: She has a normal mood and affect. Her behavior is normal.  Nursing note and vitals reviewed.    Musculoskeletal Exam: C-spine and thoracic lumbar spine good range of motion. Shoulder joints elbow joints wrist joint MCPs PIPs DIPs with good range of motion with no synovitis. Hip joints knee joints ankles MTPs PIPs DIPs with good range of motion with no synovitis.  CDAI Exam: No CDAI exam completed.    Investigation: No additional findings.  CBC Latest Ref Rng & Units 09/26/2016 05/02/2016  WBC 3.8 - 10.8 K/uL 5.8 7.0  Hemoglobin 11.7 - 15.5 g/dL 14.3 14.5  Hematocrit 35.0 - 45.0 % 43.0 43.0  Platelets 140 - 400 K/uL 284 339.0   CMP Latest Ref Rng & Units 10/27/2016 09/26/2016 07/04/2016  Glucose 65 - 99 mg/dL 101(H) 81 -  BUN 7 - 25 mg/dL 14 12 -  Creatinine 0.50 - 0.99 mg/dL 0.93 0.79 -  Sodium 135 - 146 mmol/L 139 140 -  Potassium 3.5 - 5.3 mmol/L 4.1 4.2 -  Chloride 98 - 110 mmol/L 105 106 -  CO2 20 - 31 mmol/L 26 27 -  Calcium 8.6 - 10.4 mg/dL 9.2 9.6 9.9  Total Protein 6.1 - 8.1 g/dL 6.6 7.1 -  Total Bilirubin 0.2 - 1.2 mg/dL 0.2 0.3 -  Alkaline Phos 33 - 130 U/L 105 116 -  AST 10 - 35 U/L 12 12 -  ALT 6 - 29 U/L 10 7 -  ESR1, anti- ttgneg, anti-gliadin neg, IFE IgM lambda monoclonal band Imaging: No results found.  Speciality Comments: No specialty comments available.    Procedures:  No procedures performed Allergies: Patient has no known allergies.    Assessment / Plan:     Visit Diagnoses: Age-related osteoporosis without current pathological fracture - 06/08/2016 DEXA T score -3.6 lumbar // Reclast infusion 11/08/2016. Patient had some arthralgias after her infusion. She's doing well now. She's been taking calcium and vitamin D.  Vitamin D deficiency -I would like to keep her vitamin D around 50. Plan: VITAMIN D 25 Hydroxy (Vit-D Deficiency, Fractures)  Tobacco use disorder: Association of his smoking with osteoporosis was discussed. Is smoking cessation was discussed at length.  Monoclonal gammopathy -patient went to see Hem/ Onc in [place]. I do not have those records available. According to her the workup was negative and she will have follow-up.  Medication monitoring encounter - Plan: CBC with Differential/Platelet, COMPLETE METABOLIC PANEL WITH GFR   Her other medical problems are listed as follows:  History of gastroesophageal reflux (GERD)  Chronic obstructive pulmonary disease  Coronary artery calcification seen on CAT scan  Orders: Orders Placed This Encounter  Procedures  . CBC with Differential/Platelet  . COMPLETE METABOLIC PANEL WITH GFR  . VITAMIN D 25 Hydroxy (Vit-D Deficiency, Fractures)   No orders of the defined types were placed in this encounter.   Follow-Up Instructions: Return for Osteoporosis.   Bo Merino, MD  Note - This record has been created using Editor, commissioning.  Chart creation errors have been sought, but may not always  have been located. Such creation errors do not reflect on  the standard of medical care.

## 2017-03-30 ENCOUNTER — Ambulatory Visit (INDEPENDENT_AMBULATORY_CARE_PROVIDER_SITE_OTHER): Payer: 59 | Admitting: Rheumatology

## 2017-03-30 ENCOUNTER — Encounter: Payer: Self-pay | Admitting: Rheumatology

## 2017-03-30 VITALS — BP 101/62 | HR 82 | Resp 12 | Ht 65.0 in | Wt 108.0 lb

## 2017-03-30 DIAGNOSIS — Z8719 Personal history of other diseases of the digestive system: Secondary | ICD-10-CM

## 2017-03-30 DIAGNOSIS — Z5181 Encounter for therapeutic drug level monitoring: Secondary | ICD-10-CM | POA: Diagnosis not present

## 2017-03-30 DIAGNOSIS — M810 Age-related osteoporosis without current pathological fracture: Secondary | ICD-10-CM | POA: Diagnosis not present

## 2017-03-30 DIAGNOSIS — I2510 Atherosclerotic heart disease of native coronary artery without angina pectoris: Secondary | ICD-10-CM

## 2017-03-30 DIAGNOSIS — D472 Monoclonal gammopathy: Secondary | ICD-10-CM

## 2017-03-30 DIAGNOSIS — E559 Vitamin D deficiency, unspecified: Secondary | ICD-10-CM

## 2017-03-30 DIAGNOSIS — F17200 Nicotine dependence, unspecified, uncomplicated: Secondary | ICD-10-CM | POA: Diagnosis not present

## 2017-03-30 DIAGNOSIS — J449 Chronic obstructive pulmonary disease, unspecified: Secondary | ICD-10-CM | POA: Diagnosis not present

## 2017-03-31 LAB — CBC WITH DIFFERENTIAL/PLATELET
BASOS PCT: 1.7 %
Basophils Absolute: 100 cells/uL (ref 0–200)
EOS PCT: 3.4 %
Eosinophils Absolute: 201 cells/uL (ref 15–500)
HCT: 40.4 % (ref 35.0–45.0)
HEMOGLOBIN: 13.3 g/dL (ref 11.7–15.5)
Lymphs Abs: 2136 cells/uL (ref 850–3900)
MCH: 29.7 pg (ref 27.0–33.0)
MCHC: 32.9 g/dL (ref 32.0–36.0)
MCV: 90.2 fL (ref 80.0–100.0)
MONOS PCT: 9.3 %
MPV: 9.9 fL (ref 7.5–12.5)
NEUTROS ABS: 2915 {cells}/uL (ref 1500–7800)
Neutrophils Relative %: 49.4 %
PLATELETS: 261 10*3/uL (ref 140–400)
RBC: 4.48 10*6/uL (ref 3.80–5.10)
RDW: 13.3 % (ref 11.0–15.0)
Total Lymphocyte: 36.2 %
WBC mixed population: 549 cells/uL (ref 200–950)
WBC: 5.9 10*3/uL (ref 3.8–10.8)

## 2017-03-31 LAB — COMPLETE METABOLIC PANEL WITH GFR
AG Ratio: 1.6 (calc) (ref 1.0–2.5)
ALBUMIN MSPROF: 4.1 g/dL (ref 3.6–5.1)
ALT: 6 U/L (ref 6–29)
AST: 10 U/L (ref 10–35)
Alkaline phosphatase (APISO): 65 U/L (ref 33–130)
BUN: 13 mg/dL (ref 7–25)
CO2: 28 mmol/L (ref 20–32)
CREATININE: 0.82 mg/dL (ref 0.50–0.99)
Calcium: 9.4 mg/dL (ref 8.6–10.4)
Chloride: 104 mmol/L (ref 98–110)
GFR, EST AFRICAN AMERICAN: 90 mL/min/{1.73_m2} (ref >=60)
GFR, Est Non African American: 77 mL/min/{1.73_m2} (ref >=60)
GLUCOSE: 92 mg/dL (ref 65–99)
Globulin: 2.6 g/dL (calc) (ref 1.9–3.7)
Potassium: 4.1 mmol/L (ref 3.5–5.3)
Sodium: 139 mmol/L (ref 135–146)
TOTAL PROTEIN: 6.7 g/dL (ref 6.1–8.1)
Total Bilirubin: 0.2 mg/dL (ref 0.2–1.2)

## 2017-03-31 LAB — VITAMIN D 25 HYDROXY (VIT D DEFICIENCY, FRACTURES): Vit D, 25-Hydroxy: 31 ng/mL (ref 30–100)

## 2017-03-31 NOTE — Progress Notes (Signed)
WNLs

## 2017-05-29 ENCOUNTER — Ambulatory Visit: Payer: 59 | Admitting: Obstetrics and Gynecology

## 2017-05-31 DIAGNOSIS — D472 Monoclonal gammopathy: Secondary | ICD-10-CM | POA: Diagnosis not present

## 2017-06-06 DIAGNOSIS — D472 Monoclonal gammopathy: Secondary | ICD-10-CM | POA: Diagnosis not present

## 2017-06-15 ENCOUNTER — Telehealth: Payer: Self-pay | Admitting: Pulmonary Disease

## 2017-06-15 MED ORDER — ALBUTEROL SULFATE HFA 108 (90 BASE) MCG/ACT IN AERS: 2.0000 | INHALATION_SPRAY | Freq: Four times a day (QID) | RESPIRATORY_TRACT | 5 refills | Status: DC | PRN

## 2017-06-15 MED FILL — VENTOLIN HFA 90 MCG INHALER: 108 (90 BAS | 25 days supply | Qty: 18 | Fill #0

## 2017-06-15 NOTE — Telephone Encounter (Signed)
Patient requesting refill for albuterol.

## 2017-07-19 ENCOUNTER — Ambulatory Visit (INDEPENDENT_AMBULATORY_CARE_PROVIDER_SITE_OTHER): Payer: 59 | Admitting: Obstetrics and Gynecology

## 2017-07-19 ENCOUNTER — Encounter: Payer: Self-pay | Admitting: Obstetrics and Gynecology

## 2017-07-19 ENCOUNTER — Other Ambulatory Visit: Payer: Self-pay

## 2017-07-19 VITALS — BP 110/60 | HR 80 | Resp 18 | Ht 64.75 in | Wt 105.0 lb

## 2017-07-19 DIAGNOSIS — Z01419 Encounter for gynecological examination (general) (routine) without abnormal findings: Secondary | ICD-10-CM

## 2017-07-19 DIAGNOSIS — N952 Postmenopausal atrophic vaginitis: Secondary | ICD-10-CM

## 2017-07-19 DIAGNOSIS — M818 Other osteoporosis without current pathological fracture: Secondary | ICD-10-CM

## 2017-07-19 NOTE — Progress Notes (Signed)
62 y.o. [id number] MarriedCaucasianF here for annual exam.  H/O TAH in the 90's for endometriosis, still has her adnexa. No vaginal bleeding. Not currently sexually active.  The patient has osteoporosis and started Reclast last year, followed by Orthopedics.  Her mother moved in with her. She didn't meet her mother until she was an adult. The situation is stressful, mother has no where to go.      No LMP recorded. Patient has had a hysterectomy.          Sexually active: Yes.    The current method of family planning is status post hysterectomy.    Exercising: Yes.    walking Smoker:  Yes, smoking 1/2 a PPD, she quit for 1.5 weeks. Found out her biological mother was in the ICU.  Health Maintenance: Pap:  Unsure  History of abnormal Pap:  no MMG: 05-31-16 WNL   Colonoscopy:  07-28-16 polyps- repeat in 3 yrs BMD:   06-08-16 Osteoporosis  TDaP:  05-02-16 Gardasil: N/A   reports that she has been smoking cigarettes.  She has a 34.50 pack-year smoking history. she has never used smokeless tobacco. She reports that she does not drink alcohol or use drugs. 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 3 grown kids, 2 live at home (both work). 3rd in [place] with his wife. She has 5 grandchildren   Past Medical History:  Diagnosis Date  . COPD (chronic obstructive pulmonary disease) ([place])   . Esophageal stricture   . GERD (gastroesophageal reflux disease)   . Monoclonal gammopathy    unknown significance, followed at [facility].   . Osteoporosis   . Rhinitis   . Tobacco abuse     Past Surgical History:  Procedure Laterality Date  . BREAST SURGERY Bilateral    lumpectomy   . CESAREAN SECTION    . TONSILLECTOMY    . TOTAL ABDOMINAL HYSTERECTOMY     Partial hysterectomy    Current Outpatient Medications  Medication Sig Dispense Refill  . albuterol (PROVENTIL HFA;VENTOLIN HFA) 108 (90 Base) MCG/ACT inhaler Inhale 2 puffs into the lungs every 6 (six) hours as  needed. 1 Inhaler 5  . buPROPion (ZYBAN) 150 MG 12 hr tablet Take 1 tablet (150 mg total) by mouth 2 (two) times daily. 60 tablet 3  . Calcium Carbonate-Vitamin D3 (CALCIUM 600-D) 600-400 MG-UNIT TABS Take 2 tablets by mouth daily.    . fluticasone (FLONASE) 50 MCG/ACT nasal spray Place 2 sprays into the nose daily. (Patient not taking: Reported on 03/30/2017) 16 g 0  . nicotine (NICODERM CQ - DOSED IN MG/24 HOURS) 21 mg/24hr patch Place 21 mg onto the skin daily.    Marland Kitchen omeprazole (PRILOSEC) 40 MG capsule Take 1 capsule (40 mg total) by mouth daily. 90 capsule 3  . Tiotropium Bromide-Olodaterol (STIOLTO RESPIMAT) 2.5-2.5 MCG/ACT AERS Inhale into the lungs.     Current Facility-Administered Medications  Medication Dose Route Frequency Provider Last Rate Last Dose  . 0.9 %  sodium chloride infusion  500 mL Intravenous Continuous Irene Shipper, MD      . 0.9 %  sodium chloride infusion  500 mL Intravenous Continuous Irene Shipper, MD        Family History  Adopted: Yes  Problem Relation Age of Onset  . Colon cancer Maternal Aunt     Review of Systems  Constitutional: Negative.   HENT: Negative.   Eyes: Negative.   Respiratory: Negative.   Cardiovascular: Negative.  Gastrointestinal: Negative.   Endocrine: Negative.   Genitourinary: Negative.   Musculoskeletal: Negative.   Skin: Negative.   Allergic/Immunologic: Negative.   Neurological: Negative.   Psychiatric/Behavioral: Negative.     Exam:   BP 110/60 (BP Location: Right Arm, Patient Position: Sitting, Cuff Size: Normal)   Pulse 80   Resp 18   Ht 5' 4.75" (1.645 m)   Wt 105 lb (47.6 kg)   BMI 17.61 kg/m   Weight change: @WEIGHTCHANGE @ Height:   Height: 5' 4.75" (164.5 cm)  Ht Readings from Last 3 Encounters:  07/19/17 5' 4.75" (1.645 m)  03/30/17 5\' 5"  (1.651 m)  11/08/16 5\' 5"  (1.651 m)    General appearance: alert, cooperative and appears stated age Head: Normocephalic, without obvious abnormality,  atraumatic Neck: no adenopathy, supple, symmetrical, trachea midline and thyroid normal to inspection and palpation Lungs: clear to auscultation bilaterally Cardiovascular: regular rate and rhythm Breasts: normal appearance, no masses or tenderness Abdomen: soft, non-tender; non distended,  no masses,  no organomegaly Extremities: extremities normal, atraumatic, no cyanosis or edema Skin: Skin color, texture, turgor normal. No rashes or lesions Lymph nodes: Cervical, supraclavicular, and axillary nodes normal. No abnormal inguinal nodes palpated Neurologic: Grossly normal   Pelvic: External genitalia:  no lesions              Urethra:  normal appearing urethra with no masses, tenderness or lesions              Bartholins and Skenes: normal                 Vagina: atrophic appearing vagina with normal color and discharge, no lesions              Cervix: absent               Bimanual Exam:  Uterus:  uterus absent              Adnexa: no mass, fullness, tenderness               Rectovaginal: Confirms               Anus:  normal sphincter tone, no lesions  Chaperone was present for exam.  A:  Well Woman with normal exam  Osteoporosis, managed by Orthopedics  Vaginal atrophy, not sexually active  P:   Mammogram due  Colonoscopy UTD  Discussed breast self exam  Discussed calcium and vit D intake  Labs with primary MD

## 2017-07-19 NOTE — Patient Instructions (Signed)

## 2017-07-24 ENCOUNTER — Other Ambulatory Visit: Payer: Self-pay | Admitting: Obstetrics and Gynecology

## 2017-07-24 DIAGNOSIS — Z139 Encounter for screening, unspecified: Secondary | ICD-10-CM

## 2017-08-14 ENCOUNTER — Ambulatory Visit
Admission: RE | Admit: 2017-08-14 | Discharge: 2017-08-14 | Disposition: A | Discharge status: Home / Self Care | Payer: 59 | Source: Ambulatory Visit | Attending: Obstetrics and Gynecology | Admitting: Obstetrics and Gynecology

## 2017-08-14 DIAGNOSIS — Z1231 Encounter for screening mammogram for malignant neoplasm of breast: Secondary | ICD-10-CM | POA: Diagnosis not present

## 2017-08-14 DIAGNOSIS — Z139 Encounter for screening, unspecified: Secondary | ICD-10-CM

## 2017-09-29 MED FILL — VENTOLIN HFA 90 MCG INHALER: 108 (90 BAS | 25 days supply | Qty: 18 | Fill #1

## 2017-10-11 NOTE — Progress Notes (Signed)
Office Visit Note  Patient: Robin Rivera             Date of Birth: 1956-03-11           MRN: [id number]             PCP: Patient, No Pcp Per Referring: Golden Circle, FNP Visit Date: 10/24/2017 Occupation: @GUAROCC @    Subjective:  Medication monitoring   History of Present Illness: Robin Rivera is a 62 y.o. female with history of osteoporosis.  Patient's last Reclast infusion was on 11/08/2016.  She states that after her infusion she had generalized muscle aches and joint pain lasted for 3 days.  She continues to take calcium and vitamin D on a daily basis.  She denies any height loss.  She denies any back pain at this time.  Denies any recent fractures.  She states that she has been 48 hours without a cigarette.   Activities of Daily Living:  Patient reports morning stiffness for 0 minute.   Patient Denies nocturnal pain.  Difficulty dressing/grooming: Denies Difficulty climbing stairs: Denies Difficulty getting out of chair: Denies Difficulty using hands for taps, buttons, cutlery, and/or writing: Denies   Review of Systems  Constitutional: Negative for fatigue.  HENT: Negative for mouth sores, mouth dryness and nose dryness.   Eyes: Negative for pain, visual disturbance and dryness.  Respiratory: Negative for cough, hemoptysis, shortness of breath and difficulty breathing.   Cardiovascular: Negative for chest pain, palpitations, hypertension and swelling in legs/feet.  Gastrointestinal: Negative for blood in stool, constipation and diarrhea.  Endocrine: Negative for increased urination.  Genitourinary: Negative for painful urination.  Musculoskeletal: Negative for arthralgias, joint pain, joint swelling, myalgias, muscle weakness, morning stiffness, muscle tenderness and myalgias.  Skin: Negative for color change, pallor, rash, hair loss, nodules/bumps, skin tightness, ulcers and sensitivity to sunlight.  Allergic/Immunologic: Negative for susceptible to  infections.  Neurological: Negative for dizziness, numbness, headaches and weakness.  Hematological: Negative for swollen glands.  Psychiatric/Behavioral: Positive for depressed mood. Negative for sleep disturbance. The patient is not nervous/anxious.     PMFS History:  Patient Active Problem List   Diagnosis Date Noted  . Monoclonal gammopathy 10/22/2016  . Age-related osteoporosis without current pathological fracture 09/22/2016  . Vitamin D deficiency 09/22/2016  . Routine general medical examination at a health care facility 05/02/2016  . Underweight 05/02/2016  . GERD (gastroesophageal reflux disease) 03/29/2016  . Eye irritation 02/15/2016  . Dysphagia 02/04/2016  . Atypical chest pain 02/04/2016  . Coronary artery calcification seen on CAT scan 02/04/2016  . Chronic obstructive pulmonary disease ([place]) 08/16/2013  . TOBACCO ABUSE 01/09/2009    Past Medical History:  Diagnosis Date  . COPD (chronic obstructive pulmonary disease) ([place])   . Esophageal stricture   . GERD (gastroesophageal reflux disease)   . Monoclonal gammopathy    unknown significance, followed at [facility].   . Osteoporosis   . Rhinitis   . Tobacco abuse     Family History  Adopted: Yes  Problem Relation Age of Onset  . COPD Mother   . Kidney disease Mother   . Colon cancer Maternal Aunt   . Healthy Son   . Healthy Son   . Healthy Son    Past Surgical History:  Procedure Laterality Date  . BREAST BIOPSY    . BREAST EXCISIONAL BIOPSY Right   . BREAST SURGERY Bilateral    lumpectomy   . CESAREAN SECTION    .  TONSILLECTOMY    . TOTAL ABDOMINAL HYSTERECTOMY     Partial hysterectomy   Social History   Social History Narrative   Fun: Play games on her computer, bon fires   Denies abuse and feels safe at home.      Objective: Vital Signs: BP 117/78 (BP Location: Right Arm, Patient Position: Sitting, Cuff Size: Normal)   Pulse 78   Resp 13   Ht 5\' 5"  (1.651 m)   Wt  107 lb (48.5 kg)   BMI 17.81 kg/m    Physical Exam  Constitutional: She is oriented to person, place, and time. She appears well-developed and well-nourished.  HENT:  Head: Normocephalic and atraumatic.  Eyes: Conjunctivae and EOM are normal.  Neck: Normal range of motion.  Cardiovascular: Normal rate, regular rhythm, normal heart sounds and intact distal pulses.  Pulmonary/Chest: Effort normal and breath sounds normal.  Abdominal: Soft. Bowel sounds are normal.  Lymphadenopathy:    She has no cervical adenopathy.  Neurological: She is alert and oriented to person, place, and time.  Skin: Skin is warm and dry. Capillary refill takes less than 2 seconds.  Psychiatric: She has a normal mood and affect. Her behavior is normal.  Nursing note and vitals reviewed.    Musculoskeletal Exam: C-spine, thoracic spine, and lumbar spine good ROM.  No midline spinal tenderness.  No SI joint tenderness.  Shoulder joints, elbow joints, wrist joints, MCPs, PIPs, and DIPs good ROM with no synovitis.  Hip joints, knee joints, ankle joints, MTPs, PIPs, and DIPs good ROM. No warmth or effusion of knee joints.   CDAI Exam: No CDAI exam completed.    Investigation: No additional findings. CBC Latest Ref Rng & Units 03/30/2017 09/26/2016 05/02/2016  WBC 3.8 - 10.8 Thousand/uL 5.9 5.8 7.0  Hemoglobin 11.7 - 15.5 g/dL 13.3 14.3 14.5  Hematocrit 35.0 - 45.0 % 40.4 43.0 43.0  Platelets 140 - 400 Thousand/uL 261 284 339.0   CMP Latest Ref Rng & Units 03/30/2017 10/27/2016 09/26/2016  Glucose 65 - 99 mg/dL 92 101(H) 81  BUN 7 - 25 mg/dL 13 14 12   Creatinine 0.50 - 0.99 mg/dL 0.82 0.93 0.79  Sodium 135 - 146 mmol/L 139 139 140  Potassium 3.5 - 5.3 mmol/L 4.1 4.1 4.2  Chloride 98 - 110 mmol/L 104 105 106  CO2 20 - 32 mmol/L 28 26 27   Calcium 8.6 - 10.4 mg/dL 9.4 9.2 9.6  Total Protein 6.1 - 8.1 g/dL 6.7 6.6 7.1  Total Bilirubin 0.2 - 1.2 mg/dL 0.2 0.2 0.3  Alkaline Phos 33 - 130 U/L - 105 116  AST 10 - 35  U/L 10 12 12   ALT 6 - 29 U/L 6 10 7     Imaging: No results found.  Speciality Comments: No specialty comments available.    Procedures:  No procedures performed Allergies: Patient has no known allergies.   Assessment / Plan:     Visit Diagnoses: Age-related osteoporosis without current pathological fracture -her last Reclast infusion was on 11/08/2016.  She had generalized muscle aches and joint pain for 3 days following the infusion.  She was advised that she can take Tylenol before her next infusion.  CBC and CMP and vitamin D were checked today before her next infusion.  She will continue taking vitamin D and calcium on a daily basis.  Her next DEXA scan will be due in December 2019. Last DEXA was on 06/08/2016 T score -3.6 lumbar.  She has no midline spinal tenderness.  No  recent fractures.  She is trying to quit smoking and is been without a cigarette for 48 hours.  Her height today was 5 feet 4 inches.  We will continue to monitor her height at least once yearly.- Plan: VITAMIN D 25 Hydroxy (Vit-D Deficiency, Fractures)  Medication monitoring encounter -CBC and CMP were drawn today before her next infusion. Plan: CBC with Differential/Platelet, COMPLETE METABOLIC PANEL WITH GFR   Monoclonal gammopathy - She went to see Hem/ Onc in [place].  History of vitamin D deficiency -vitamin D level will be checked today.  She takes calcium and vitamin D on a daily basis.  Plan: VITAMIN D 25 Hydroxy (Vit-D Deficiency, Fractures)  Tobacco use disorder: She has not smoked in 48 hours.  She is trying to quit smoking.  She is no longer taking Zyban.   Other medical conditions are listed as follows:  History of gastroesophageal reflux (GERD)  Coronary artery calcification - seen on CAT scan  History of COPD     Orders: Orders Placed This Encounter  Procedures  . CBC with Differential/Platelet  . COMPLETE METABOLIC PANEL WITH GFR  . VITAMIN D 25 Hydroxy (Vit-D Deficiency, Fractures)     No orders of the defined types were placed in this encounter.   Follow-Up Instructions: Return in about 6 months (around 04/26/2018) for Osteoporosis.   Ofilia Neas, PA-C I examined and evaluated the patient with Hazel Sams PA.  Patient had some side effects from Reclast IV.  Although she is willing to continue with Reclast.  We will schedule DEXA towards the end of the year.  He recently quit smoking.  Smoking cessation was discussed.  Use of calcium vitamin D and exercise was discussed.  The plan of care was discussed as noted above.  Bo Merino, MD Note - This record has been created using Editor, commissioning.  Chart creation errors have been sought, but may not always  have been located. Such creation errors do not reflect on  the standard of medical care.

## 2017-10-24 ENCOUNTER — Ambulatory Visit: Payer: 59 | Admitting: Rheumatology

## 2017-10-24 ENCOUNTER — Encounter: Payer: Self-pay | Admitting: Rheumatology

## 2017-10-24 VITALS — BP 117/78 | HR 78 | Resp 13 | Ht 64.57 in | Wt 107.0 lb

## 2017-10-24 DIAGNOSIS — Z8719 Personal history of other diseases of the digestive system: Secondary | ICD-10-CM | POA: Diagnosis not present

## 2017-10-24 DIAGNOSIS — I2510 Atherosclerotic heart disease of native coronary artery without angina pectoris: Secondary | ICD-10-CM

## 2017-10-24 DIAGNOSIS — Z8709 Personal history of other diseases of the respiratory system: Secondary | ICD-10-CM

## 2017-10-24 DIAGNOSIS — D472 Monoclonal gammopathy: Secondary | ICD-10-CM | POA: Diagnosis not present

## 2017-10-24 DIAGNOSIS — M810 Age-related osteoporosis without current pathological fracture: Secondary | ICD-10-CM

## 2017-10-24 DIAGNOSIS — Z8639 Personal history of other endocrine, nutritional and metabolic disease: Secondary | ICD-10-CM

## 2017-10-24 DIAGNOSIS — F17200 Nicotine dependence, unspecified, uncomplicated: Secondary | ICD-10-CM | POA: Diagnosis not present

## 2017-10-24 DIAGNOSIS — Z5181 Encounter for therapeutic drug level monitoring: Secondary | ICD-10-CM | POA: Diagnosis not present

## 2017-10-24 DIAGNOSIS — I2584 Coronary atherosclerosis due to calcified coronary lesion: Secondary | ICD-10-CM | POA: Diagnosis not present

## 2017-10-25 LAB — COMPLETE METABOLIC PANEL WITH GFR
AG RATIO: 1.8 (calc) (ref 1.0–2.5)
ALBUMIN MSPROF: 4.2 g/dL (ref 3.6–5.1)
ALKALINE PHOSPHATASE (APISO): 90 U/L (ref 33–130)
ALT: 7 U/L (ref 6–29)
AST: 11 U/L (ref 10–35)
BILIRUBIN TOTAL: 0.2 mg/dL (ref 0.2–1.2)
BUN / CREAT RATIO: 12 (calc) (ref 6–22)
BUN: 12 mg/dL (ref 7–25)
CO2: 32 mmol/L (ref 20–32)
Calcium: 9.5 mg/dL (ref 8.6–10.4)
Chloride: 101 mmol/L (ref 98–110)
Creat: 1 mg/dL — ABNORMAL HIGH (ref 0.50–0.99)
GFR, EST AFRICAN AMERICAN: 70 mL/min/{1.73_m2} (ref >=60)
GFR, Est Non African American: 61 mL/min/{1.73_m2} (ref >=60)
Globulin: 2.4 g/dL (calc) (ref 1.9–3.7)
Glucose, Bld: 102 mg/dL — ABNORMAL HIGH (ref 65–99)
POTASSIUM: 4.5 mmol/L (ref 3.5–5.3)
Sodium: 138 mmol/L (ref 135–146)
TOTAL PROTEIN: 6.6 g/dL (ref 6.1–8.1)

## 2017-10-25 LAB — CBC WITH DIFFERENTIAL/PLATELET
BASOS ABS: 78 {cells}/uL (ref 0–200)
BASOS PCT: 1.6 %
EOS ABS: 137 {cells}/uL (ref 15–500)
Eosinophils Relative: 2.8 %
HCT: 40.5 % (ref 35.0–45.0)
HEMOGLOBIN: 13.6 g/dL (ref 11.7–15.5)
Lymphs Abs: 1916 cells/uL (ref 850–3900)
MCH: 30 pg (ref 27.0–33.0)
MCHC: 33.6 g/dL (ref 32.0–36.0)
MCV: 89.2 fL (ref 80.0–100.0)
MPV: 9.6 fL (ref 7.5–12.5)
Monocytes Relative: 7.9 %
Neutro Abs: 2381 cells/uL (ref 1500–7800)
Neutrophils Relative %: 48.6 %
PLATELETS: 285 10*3/uL (ref 140–400)
RBC: 4.54 10*6/uL (ref 3.80–5.10)
RDW: 12.8 % (ref 11.0–15.0)
TOTAL LYMPHOCYTE: 39.1 %
WBC: 4.9 10*3/uL (ref 3.8–10.8)
WBCMIX: 387 {cells}/uL (ref 200–950)

## 2017-10-25 LAB — VITAMIN D 25 HYDROXY (VIT D DEFICIENCY, FRACTURES): Vit D, 25-Hydroxy: 39 ng/mL (ref 30–100)

## 2017-10-25 NOTE — Progress Notes (Signed)
CBC WNL. Glucose is 102. Vitamin D is 39. Please encourage her to continue taking daily Vitamin D supplement.

## 2017-10-27 ENCOUNTER — Telehealth: Payer: Self-pay

## 2017-10-27 NOTE — Telephone Encounter (Signed)
Called pts insurance [facility]) to check the benefits for Reclast infusion. Spoke with Elmyra Ricks who states states that no prior authorization is required. Patient has a $300 deductible. After deductible has been met, insurance will pay up to 80% and pt will be responsible for 20%. Patient has not paid anything towards her deductible.   Phone: [phone] Reference number: [id number]  Called pt to update. No answer, left message.  Samhita Kretsch, [place], CPhT 2:26 PM

## 2017-10-27 NOTE — Telephone Encounter (Signed)
error 

## 2017-10-27 NOTE — Telephone Encounter (Signed)
-----   Message from Carole Binning, LPN sent at 11/24/6193  4:52 PM EDT ----- Regarding: Please do benefits investigation Please do benefits investigation for reclast. Thanks!

## 2017-10-30 ENCOUNTER — Encounter: Payer: Self-pay | Admitting: *Deleted

## 2017-10-30 NOTE — Telephone Encounter (Signed)
Attempted to contact the patient and left message for patient to call the office. Reached out to patient via my chart.

## 2017-10-31 ENCOUNTER — Other Ambulatory Visit: Payer: Self-pay | Admitting: *Deleted

## 2017-10-31 ENCOUNTER — Encounter: Payer: Self-pay | Admitting: *Deleted

## 2017-10-31 DIAGNOSIS — M810 Age-related osteoporosis without current pathological fracture: Secondary | ICD-10-CM

## 2017-10-31 NOTE — Telephone Encounter (Signed)
Patient returned call. Gave her the update on her Reclast BIV. She would like to proceed with the infusion. Please place order and advise pt on what to do next. Thanks!  Jenika Chiem, [place], CPhT 9:00 AM

## 2017-10-31 NOTE — Telephone Encounter (Signed)
Infusion orders placed and patient advised via my chart.

## 2017-11-09 ENCOUNTER — Ambulatory Visit (HOSPITAL_COMMUNITY)
Admission: RE | Admit: 2017-11-09 | Discharge: 2017-11-09 | Disposition: A | Discharge status: Home / Self Care | Payer: 59 | Source: Ambulatory Visit | Attending: Rheumatology | Admitting: Rheumatology

## 2017-11-09 DIAGNOSIS — M810 Age-related osteoporosis without current pathological fracture: Secondary | ICD-10-CM | POA: Diagnosis not present

## 2017-11-09 MED ORDER — DIPHENHYDRAMINE HCL 25 MG PO CAPS: 25.0000 mg | ORAL_CAPSULE | Freq: Once | ORAL | Status: DC

## 2017-11-09 MED ORDER — ACETAMINOPHEN 325 MG PO TABS: 650.0000 mg | ORAL_TABLET | Freq: Once | ORAL | Status: DC

## 2017-11-09 MED ORDER — ZOLEDRONIC ACID 5 MG/100ML IV SOLN
INTRAVENOUS | Status: AC
  Filled 2017-11-09: qty 100

## 2017-11-09 MED ORDER — ZOLEDRONIC ACID 5 MG/100ML IV SOLN
5.0000 mg | Freq: Once | INTRAVENOUS | Status: AC
  Administered 2017-11-09: 5 mg via INTRAVENOUS

## 2017-11-12 IMAGING — CT CT CHEST LUNG CANCER SCREENING LOW DOSE W/O CM
2 of 5 series · 15 of 40 positions shown, 18 images · non-contrast
Comparison: Chest radiograph 06/02/2014.  No prior CT.

CLINICAL DATA: [DATE] pack-year smoking history.  Asymptomatic.

EXAM:
CT CHEST WITHOUT CONTRAST LOW-DOSE FOR LUNG CANCER SCREENING
TECHNIQUE: Multidetector CT imaging of the chest was performed following the
standard protocol without IV contrast.

[Series 3: lung thins 1.0 · axial · 0.64mm/px · z∈[-292,+6]mm · 12 of 328 slices shown, 15 images]
[im 15/328  mediastinal]
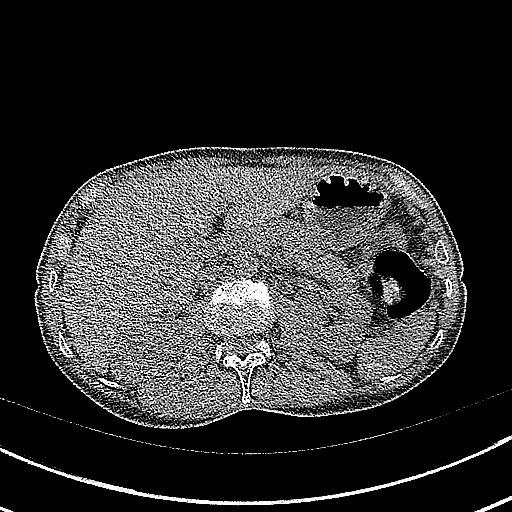
[im 15/328  lung]
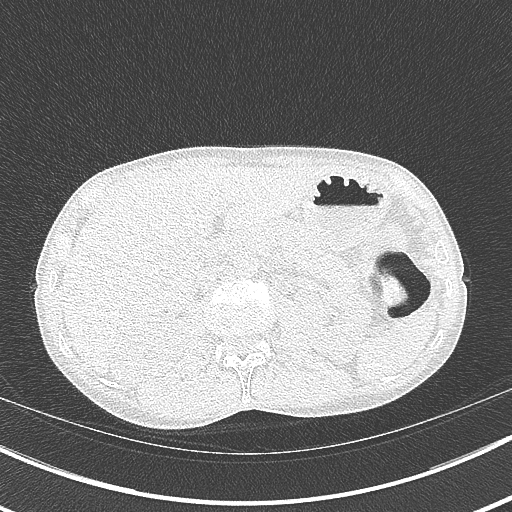
[im 45/328  lung]
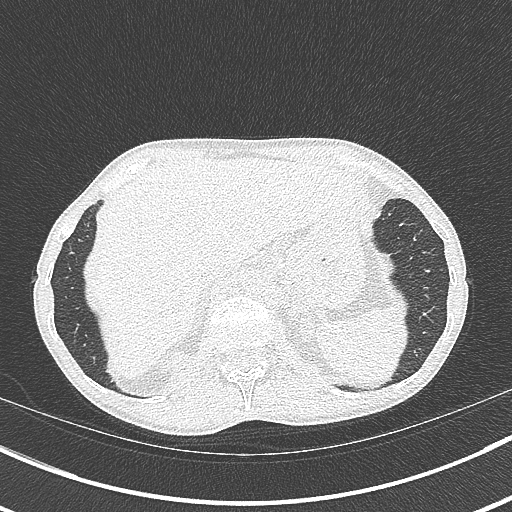
[im 75/328  lung]
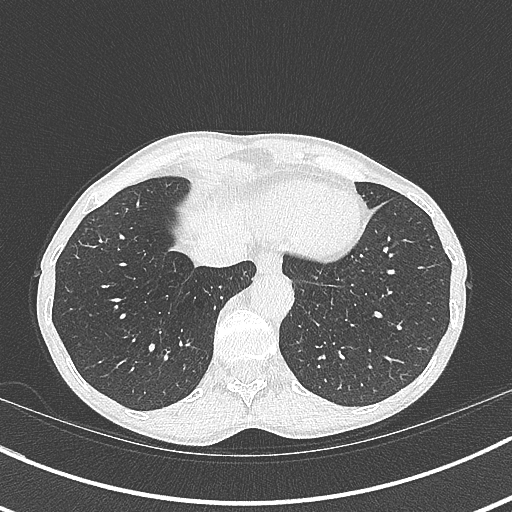
[im 105/328  lung]
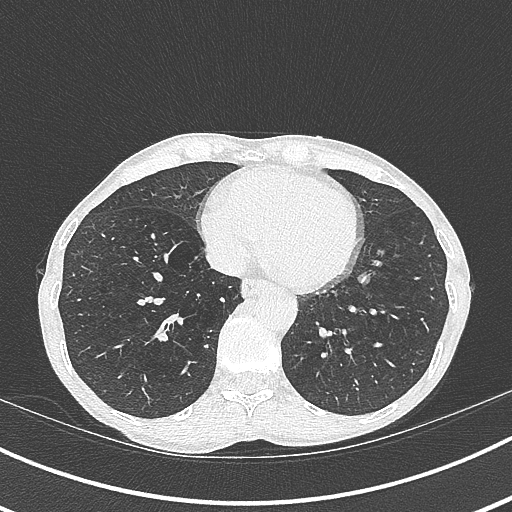
[im 119/328  mediastinal]
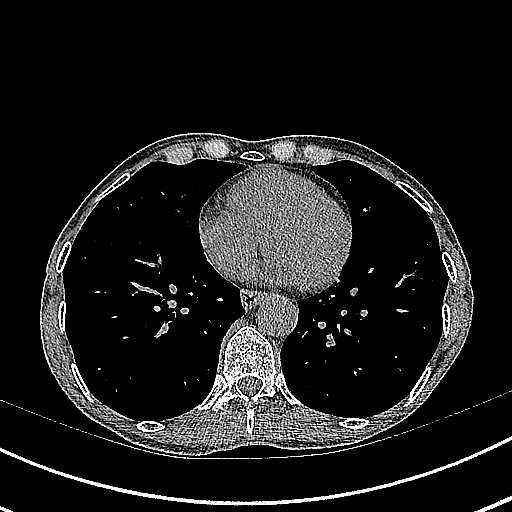
[im 119/328  lung]
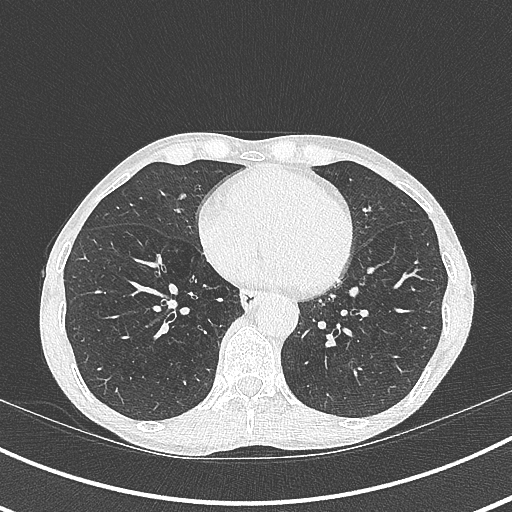
[im 149/328  lung]
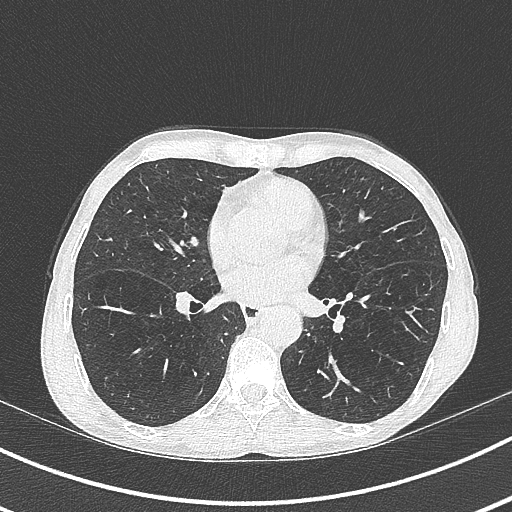
[im 179/328  lung]
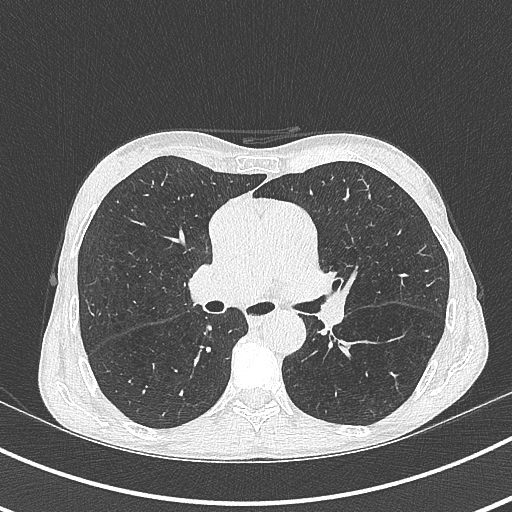
[im 209/328  lung]
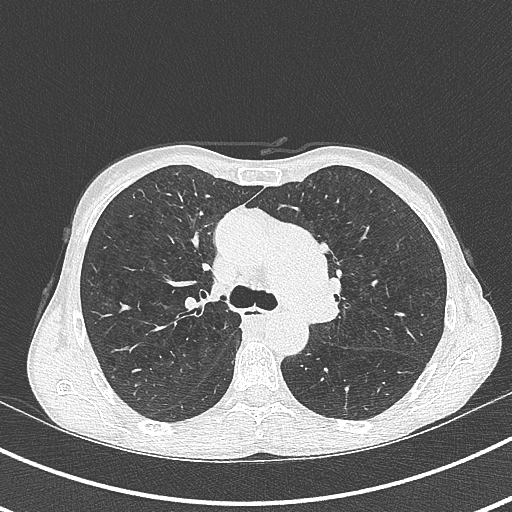
[im 223/328  mediastinal]
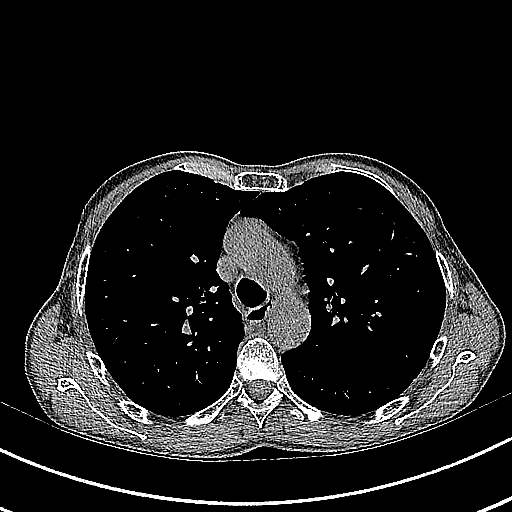
[im 223/328  lung]
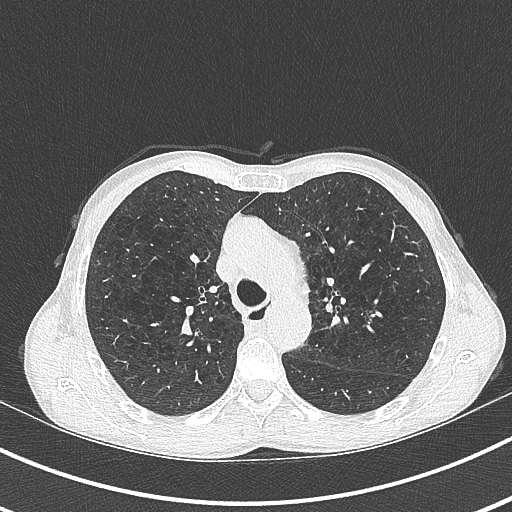
[im 253/328  lung]
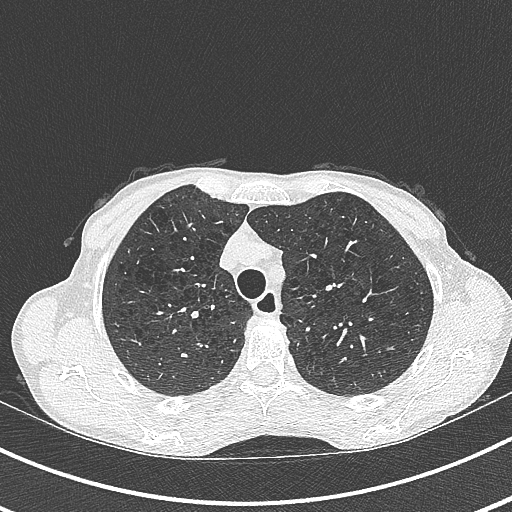
[im 283/328  lung]
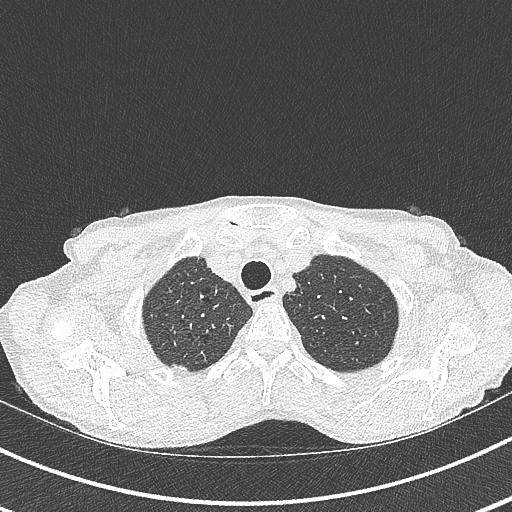
[im 313/328  lung]
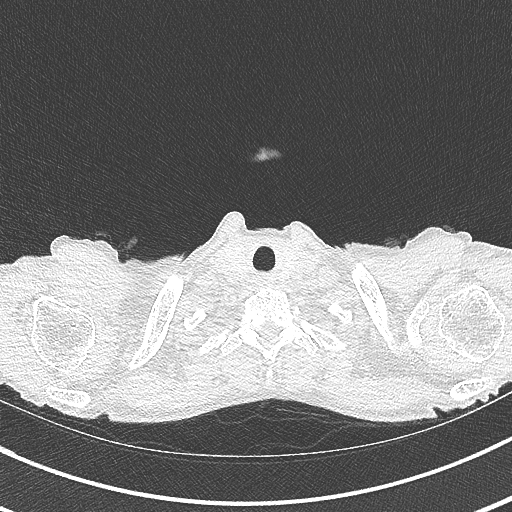

[Series 5: coronal · coronal · 0.59mm/px · 3 of 100 slices shown]
[im 20/100  lung]
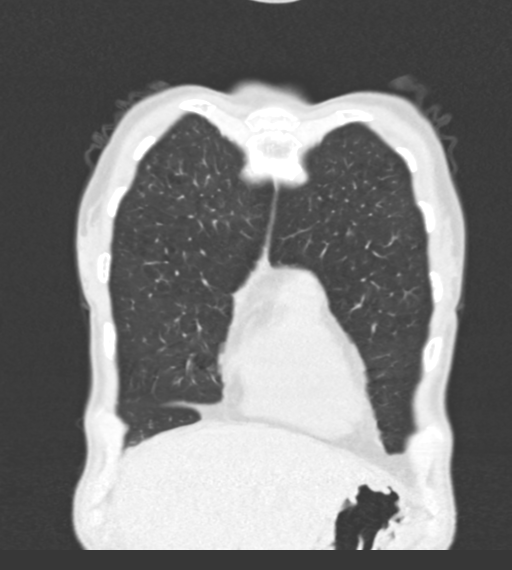
[im 40/100  lung]
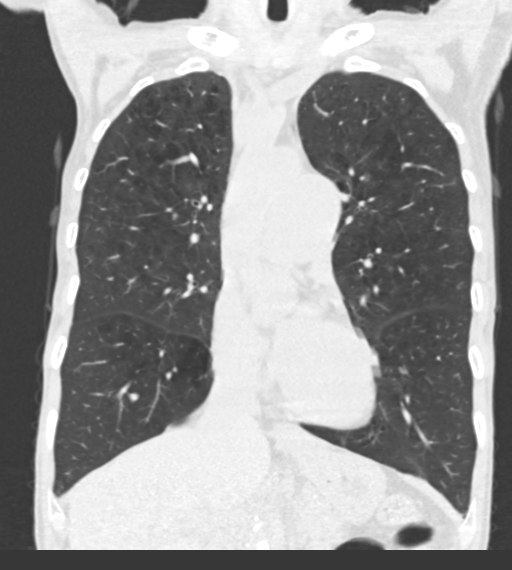
[im 60/100  lung]
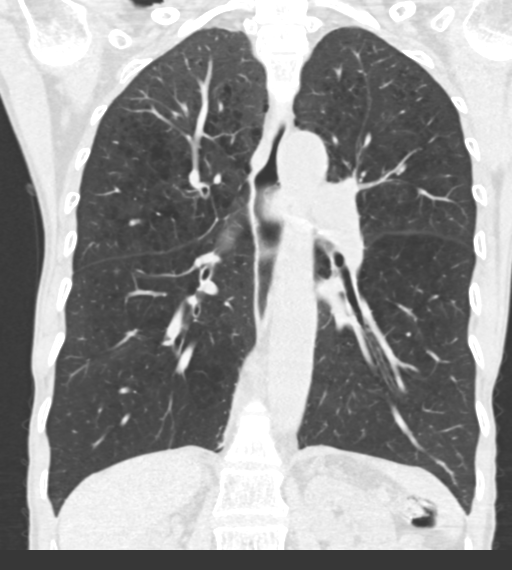

[15 of 40 positions shown; findings below may reference images not displayed]

FINDINGS: Cardiovascular: Bovine arch. Aortic and branch vessel
atherosclerosis. Normal heart size, without pericardial effusion.
Pulmonary artery enlargement, including a 3.9 cm left pulmonary
artery and a 2.9 cm right pulmonary artery.

Mediastinum/Nodes: No mediastinal or definite hilar adenopathy,
given limitations of unenhanced CT. Fluid level in the esophagus on
image 34/series 2.

Prominent thoracic duct, including on image 48/series 2.

Lungs/Pleura: No pleural fluid. Moderate centrilobular emphysema.
Scattered bilateral pulmonary nodules, including at maximally volume
derived equivalent diameter 3.1 mm in the right upper lobe/apex.

Upper abdomen: Left hepatic lobe hypo attenuating lesion is likely a
cyst. Normal imaged portions of the spleen, stomach, pancreas,
adrenal glands, kidneys. Abdominal aortic atherosclerosis.

Musculoskeletal: No acute osseous abnormality.
IMPRESSION: 1. Lung-RADS Category 2, benign appearance or behavior. Continue
annual screening with low-dose chest CT without contrast in 12
months.
2. Pulmonary artery enlargement, including a significantly enlarged
left pulmonary artery. This suggests pulmonary arterial
hypertension. Given the extent of pulmonary artery enlargement,
consider echocardiography for further characterization.
3. Esophageal air fluid level suggests dysmotility or
gastroesophageal reflux.
4.  Aortic atherosclerosis.
These results will be called to the ordering clinician or
representative by the Radiologist Assistant, and communication
documented in the PACS or zVision Dashboard.

## 2017-11-20 ENCOUNTER — Other Ambulatory Visit: Payer: Self-pay | Admitting: *Deleted

## 2017-11-20 DIAGNOSIS — Z5181 Encounter for therapeutic drug level monitoring: Secondary | ICD-10-CM

## 2017-11-21 LAB — COMPLETE METABOLIC PANEL WITH GFR
AG RATIO: 1.5 (calc) (ref 1.0–2.5)
ALKALINE PHOSPHATASE (APISO): 91 U/L (ref 33–130)
ALT: 9 U/L (ref 6–29)
AST: 11 U/L (ref 10–35)
Albumin: 4.1 g/dL (ref 3.6–5.1)
BILIRUBIN TOTAL: 0.2 mg/dL (ref 0.2–1.2)
BUN: 13 mg/dL (ref 7–25)
CHLORIDE: 103 mmol/L (ref 98–110)
CO2: 29 mmol/L (ref 20–32)
Calcium: 9.2 mg/dL (ref 8.6–10.4)
Creat: 0.8 mg/dL (ref 0.50–0.99)
GFR, EST AFRICAN AMERICAN: 92 mL/min/{1.73_m2} (ref >=60)
GFR, Est Non African American: 79 mL/min/{1.73_m2} (ref >=60)
Globulin: 2.7 g/dL (calc) (ref 1.9–3.7)
Glucose, Bld: 99 mg/dL (ref 65–99)
POTASSIUM: 3.7 mmol/L (ref 3.5–5.3)
Sodium: 138 mmol/L (ref 135–146)
TOTAL PROTEIN: 6.8 g/dL (ref 6.1–8.1)

## 2017-11-29 DIAGNOSIS — D472 Monoclonal gammopathy: Secondary | ICD-10-CM | POA: Diagnosis not present

## 2017-12-05 DIAGNOSIS — D472 Monoclonal gammopathy: Secondary | ICD-10-CM | POA: Diagnosis not present

## 2018-02-05 ENCOUNTER — Ambulatory Visit (INDEPENDENT_AMBULATORY_CARE_PROVIDER_SITE_OTHER)
Admission: RE | Admit: 2018-02-05 | Discharge: 2018-02-05 | Disposition: A | Discharge status: Home / Self Care | Payer: 59 | Source: Ambulatory Visit | Attending: Acute Care | Admitting: Acute Care

## 2018-02-05 DIAGNOSIS — F17210 Nicotine dependence, cigarettes, uncomplicated: Secondary | ICD-10-CM | POA: Diagnosis not present

## 2018-02-05 DIAGNOSIS — Z122 Encounter for screening for malignant neoplasm of respiratory organs: Secondary | ICD-10-CM

## 2018-02-08 ENCOUNTER — Other Ambulatory Visit: Payer: Self-pay | Admitting: Acute Care

## 2018-02-08 DIAGNOSIS — Z122 Encounter for screening for malignant neoplasm of respiratory organs: Secondary | ICD-10-CM

## 2018-02-08 DIAGNOSIS — F17210 Nicotine dependence, cigarettes, uncomplicated: Secondary | ICD-10-CM

## 2018-02-16 MED FILL — VENTOLIN HFA 90 MCG INHALER: 108 (90 BAS | 25 days supply | Qty: 18 | Fill #2

## 2018-02-28 ENCOUNTER — Telehealth: Payer: 59 | Admitting: Family

## 2018-02-28 DIAGNOSIS — L309 Dermatitis, unspecified: Secondary | ICD-10-CM

## 2018-02-28 MED ORDER — PREDNISONE 10 MG (21) PO TBPK: ORAL_TABLET | ORAL | 0 refills | Status: DC

## 2018-02-28 MED FILL — predniSONE 10 MG (21) TBPK: 10 | 6 days supply | Qty: 21 | Fill #0

## 2018-02-28 NOTE — Progress Notes (Signed)
E Visit for Rash  We are sorry that you are not feeling well. Here is how we plan to help!  Based on what you shared with me it looks like you have contact dermatitis.  Contact dermatitis is a skin rash caused by something that touches the skin and causes irritation or inflammation.  Your skin may be red, swollen, dry, cracked, and itch.  The rash should go away in a few days but can last a few weeks.  If you get a rash, it's important to figure out what caused it so the irritant can be avoided in the future.      Prednisone 10 mg daily for 6 days (see taper instructions below)  Directions for 6 day taper: Day 1: 2 tablets before breakfast, 1 after both lunch & dinner and 2 at bedtime Day 2: 1 tab before breakfast, 1 after both lunch & dinner and 2 at bedtime Day 3: 1 tab at each meal & 1 at bedtime Day 4: 1 tab at breakfast, 1 at lunch, 1 at bedtime Day 5: 1 tab at breakfast & 1 tab at bedtime Day 6: 1 tab at breakfast     HOME CARE:   Take cool showers and avoid direct sunlight.  Apply cool compress or wet dressings.  Take a bath in an oatmeal bath.  Sprinkle content of one Aveeno packet under running faucet with comfortably warm water.  Bathe for 15-20 minutes, 1-2 times daily.  Pat dry with a towel. Do not rub the rash.  Use hydrocortisone cream.  Take an antihistamine like Benadryl for widespread rashes that itch.  The adult dose of Benadryl is 25-50 mg by mouth 4 times daily.  Caution:  This type of medication may cause sleepiness.  Do not drink alcohol, drive, or operate dangerous machinery while taking antihistamines.  Do not take these medications if you have prostate enlargement.  Read package instructions thoroughly on all medications that you take.  GET HELP RIGHT AWAY IF:   Symptoms don't go away after treatment.  Severe itching that persists.  If you rash spreads or swells.  If you rash begins to smell.  If it blisters and opens or develops a  yellow-brown crust.  You develop a fever.  You have a sore throat.  You become short of breath.  MAKE SURE YOU:  Understand these instructions. Will watch your condition. Will get help right away if you are not doing well or get worse.  Thank you for choosing an e-visit. Your e-visit answers were reviewed by a board certified advanced clinical practitioner to complete your personal care plan. Depending upon the condition, your plan could have included both over the counter or prescription medications. Please review your pharmacy choice. Be sure that the pharmacy you have chosen is open so that you can pick up your prescription now.  If there is a problem you may message your provider in MyChart to have the prescription routed to another pharmacy. Your safety is important to us. If you have drug allergies check your prescription carefully.  For the next 24 hours, you can use MyChart to ask questions about today's visit, request a non-urgent call back, or ask for a work or school excuse from your e-visit provider. You will get an email in the next two days asking about your experience. I hope that your e-visit has been valuable and will speed your recovery.      

## 2018-03-29 ENCOUNTER — Ambulatory Visit (INDEPENDENT_AMBULATORY_CARE_PROVIDER_SITE_OTHER): Payer: Self-pay | Admitting: Nurse Practitioner

## 2018-03-29 VITALS — BP 110/75 | HR 82 | Temp 98.0°F | Resp 16 | Wt 108.6 lb

## 2018-03-29 DIAGNOSIS — H6123 Impacted cerumen, bilateral: Secondary | ICD-10-CM

## 2018-03-29 DIAGNOSIS — H6093 Unspecified otitis externa, bilateral: Secondary | ICD-10-CM

## 2018-03-29 MED ORDER — CARBAMIDE PEROXIDE 6.5 % OT SOLN: 5.0000 [drp] | Freq: Two times a day (BID) | OTIC | 0 refills | Status: AC

## 2018-03-29 MED ORDER — OFLOXACIN 0.3 % OT SOLN: 5.0000 [drp] | Freq: Every day | OTIC | 0 refills | Status: AC

## 2018-03-29 MED FILL — OFLOXACIN 0.3% EAR DROPS: 0.3 | 7 days supply | Qty: 5 | Fill #0

## 2018-03-29 NOTE — Progress Notes (Signed)
Subjective:    Robin Rivera is a 62 y.o. female who presents with complaints of  "clogged ears".  Patient complains of "fullness" in both ears and states, "I can't hear a thing out of them".  Patient also complains of some maxillary sinus pressure.  diminished hearing in both ears for the past 2 days. There is a prior history of cerumen impaction. The patient has not been using ear drops to loosen wax immediately prior to this visit. The patient denies ear pain, but states it feels more like "pressure" and like she is in a tunnel.  The patient's history has been marked as reviewed and updated as appropriate.  Past Medical History:  Diagnosis Date  . COPD (chronic obstructive pulmonary disease) ([place])   . Esophageal stricture   . GERD (gastroesophageal reflux disease)   . Monoclonal gammopathy    unknown significance, followed at [facility].   . Osteoporosis   . Rhinitis   . Tobacco abuse    Current Outpatient Medications  Medication Sig Dispense Refill  . albuterol (PROVENTIL HFA;VENTOLIN HFA) 108 (90 Base) MCG/ACT inhaler Inhale 2 puffs into the lungs every 6 (six) hours as needed. 1 Inhaler 5  . omeprazole (PRILOSEC) 40 MG capsule Take 1 capsule (40 mg total) by mouth daily. 90 capsule 3  . Calcium Carbonate-Vitamin D3 (CALCIUM 600-D) 600-400 MG-UNIT TABS Take 2 tablets by mouth daily.    . predniSONE (STERAPRED UNI-PAK 21 TAB) 10 MG (21) TBPK tablet As directed (Patient not taking: Reported on 03/29/2018) 21 tablet 0  . Tiotropium Bromide-Olodaterol (STIOLTO RESPIMAT) 2.5-2.5 MCG/ACT AERS Inhale into the lungs.     Current Facility-Administered Medications  Medication Dose Route Frequency Provider Last Rate Last Dose  . 0.9 %  sodium chloride infusion  500 mL Intravenous Continuous Irene Shipper, MD      . 0.9 %  sodium chloride infusion  500 mL Intravenous Continuous Irene Shipper, MD       Allergies: Patient has no known allergies.  Review of  Systems Constitutional: negative Eyes: negative Ears, nose, mouth, throat, and face: positive for ear fullness/pressure, ears feel "clogged", negative for ear drainage, hoarseness, nasal congestion and sore throat Respiratory: negative Cardiovascular: negative Gastrointestinal: negative Neurological: negative Allergic/Immunologic: negative    Objective:    Auditory canal(s) of both ears are completely obstructed with cerumen.   Cerumen was removed using gentle irrigation. Tympanic membranes are intact following the procedure.  Auditory canals are inflamed.    Assessment:    Bilateral Cerumen Impaction with otitis externa.    Plan:   Exam findings, diagnosis etiology and medication use and indications reviewed with patient. Follow- Up and discharge instructions provided. No emergent/urgent issues found on exam. Patient education was provided. Patient verbalized understanding of information provided and agrees with plan of care (POC), all questions answered. The patient is advised to call or return to clinic if condition does not see an improvement in symptoms, or to seek the care of the closest emergency department if condition worsens with the above plan.  1. Otitis externa of both ears, unspecified chronicity, unspecified type  - ofloxacin (FLOXIN OTIC) 0.3 % OTIC solution; Place 5 drops into both ears daily for 10 days.  Dispense: 5 mL; Refill: 0 -Use medication as prescribed. -If needed, have prescription filled for earwax softener and use as directed. -Ibuprofen or Tylenol for pain, fever, or general discomfort. -Keep the ear canals clean and dry, may use a cotton ball to place  in the ear during showering. -Follow-up as needed for further cerumen disimpaction in the future.  2. Bilateral impacted cerumen  - carbamide peroxide (DEBROX) 6.5 % OTIC solution; Place 5 drops into both ears 2 (two) times daily for 14 days.  Dispense: 15 mL; Refill: 0 -Use medication as  prescribed. -If needed, have prescription filled for earwax softener and use as directed. -Ibuprofen or Tylenol for pain, fever, or general discomfort. -Keep the ear canals clean and dry, may use a cotton ball to place in the ear during showering. -Follow-up as needed for further cerumen disimpaction in the future.

## 2018-03-29 NOTE — Patient Instructions (Addendum)
Otitis Externa -Use medication as prescribed. -If needed, have prescription filled for earwax softener and use as directed. -Ibuprofen or Tylenol for pain, fever, or general discomfort. -Keep the ear canals clean and dry, may use a cotton ball to place in the ear during showering. -Follow-up as needed for further cerumen disimpaction in the future.  Otitis externa is an infection of the outer ear canal. The outer ear canal is the area between the outside of the ear and the eardrum. Otitis externa is sometimes called "swimmer's ear." What are the causes? This condition may be caused by:  Swimming in dirty water.  Moisture in the ear.  An injury to the inside of the ear.  An object stuck in the ear.  A cut or scrape on the outside of the ear.  What increases the risk? This condition is more likely to develop in swimmers. What are the signs or symptoms? The first symptom of this condition is often itching in the ear. Later signs and symptoms include:  Swelling of the ear.  Redness in the ear.  Ear pain. The pain may get worse when you pull on your ear.  Pus coming from the ear.  How is this diagnosed? This condition may be diagnosed by examining the ear and testing fluid from the ear for bacteria and funguses. How is this treated? This condition may be treated with:  Antibiotic ear drops. These are often given for 10-14 days.  Medicine to reduce itching and swelling.  Follow these instructions at home:  If you were prescribed antibiotic ear drops, apply them as told by your health care provider. Do not stop using the antibiotic even if your condition improves.  Take over-the-counter and prescription medicines only as told by your health care provider.  Keep all follow-up visits as told by your health care provider. This is important. How is this prevented?  Keep your ear dry. Use the corner of a towel to dry your ear after you swim or bathe.  Avoid scratching or  putting things in your ear. Doing these things can damage the ear canal or remove the protective wax that lines it, which makes it easier for bacteria and funguses to grow.  Avoid swimming in lakes, polluted water, or pools that may not have the right amount of chlorine.  Consider making ear drops and putting 3 or 4 drops in each ear after you swim. Ask your health care provider about how you can make ear drops. Contact a health care provider if:  You have a fever.  After 3 days your ear is still red, swollen, painful, or draining pus.  Your redness, swelling, or pain gets worse.  You have a severe headache.  You have redness, swelling, pain, or tenderness in the area behind your ear. This information is not intended to replace advice given to you by your health care provider. Make sure you discuss any questions you have with your health care provider. Document Released: 06/06/2005 Document Revised: 07/14/2015 Document Reviewed: 03/16/2015 Elsevier Interactive Patient Education  2018 [place], Adult The ears produce a substance called earwax that helps keep bacteria out of the ear and protects the skin in the ear canal. Occasionally, earwax can build up in the ear and cause discomfort or hearing loss. What increases the risk? This condition is more likely to develop in people who:  Are female.  Are elderly.  Naturally produce more earwax.  Clean their ears often with cotton swabs.  Use earplugs often.  Use in-ear headphones often.  Wear hearing aids.  Have narrow ear canals.  Have earwax that is overly thick or sticky.  Have eczema.  Are dehydrated.  Have excess hair in the ear canal.  What are the signs or symptoms? Symptoms of this condition include:  Reduced or muffled hearing.  A feeling of fullness in the ear or feeling that the ear is plugged.  Fluid coming from the ear.  Ear pain.  Ear itch.  Ringing in the  ear.  Coughing.  An obvious piece of earwax that can be seen inside the ear canal.  How is this diagnosed? This condition may be diagnosed based on:  Your symptoms.  Your medical history.  An ear exam. During the exam, your health care provider will look into your ear with an instrument called an otoscope.  You may have tests, including a hearing test. How is this treated? This condition may be treated by:  Using ear drops to soften the earwax.  Having the earwax removed by a health care provider. The health care provider may: ? Flush the ear with water. ? Use an instrument that has a loop on the end (curette). ? Use a suction device.  Surgery to remove the wax buildup. This may be done in severe cases.  Follow these instructions at home:  Take over-the-counter and prescription medicines only as told by your health care provider.  Do not put any objects, including cotton swabs, into your ear. You can clean the opening of your ear canal with a washcloth or facial tissue.  Follow instructions from your health care provider about cleaning your ears. Do not over-clean your ears.  Drink enough fluid to keep your urine clear or pale yellow. This will help to thin the earwax.  Keep all follow-up visits as told by your health care provider. If earwax builds up in your ears often or if you use hearing aids, consider seeing your health care provider for routine, preventive ear cleanings. Ask your health care provider how often you should schedule your cleanings.  If you have hearing aids, clean them according to instructions from the manufacturer and your health care provider. Contact a health care provider if:  You have ear pain.  You develop a fever.  You have blood, pus, or other fluid coming from your ear.  You have hearing loss.  You have ringing in your ears that does not go away.  Your symptoms do not improve with treatment.  You feel like the room is spinning  (vertigo). Summary  Earwax can build up in the ear and cause discomfort or hearing loss.  The most common symptoms of this condition include reduced or muffled hearing and a feeling of fullness in the ear or feeling that the ear is plugged.  This condition may be diagnosed based on your symptoms, your medical history, and an ear exam.  This condition may be treated by using ear drops to soften the earwax or by having the earwax removed by a health care provider.  Do not put any objects, including cotton swabs, into your ear. You can clean the opening of your ear canal with a washcloth or facial tissue. This information is not intended to replace advice given to you by your health care provider. Make sure you discuss any questions you have with your health care provider. Document Released: 07/14/2004 Document Revised: 08/17/2016 Document Reviewed: 08/17/2016 Elsevier Interactive Patient Education  [employer].

## 2018-04-01 ENCOUNTER — Telehealth: Payer: Self-pay

## 2018-04-01 NOTE — Telephone Encounter (Signed)
Courtesy Call - LMOVM of home advising this was a courtesy call to see how patient was doing. Advised to complete all medications prescribed as directed.

## 2018-04-25 NOTE — Progress Notes (Deleted)
Office Visit Note  Patient: Robin Rivera             Date of Birth: February 01, 1956           MRN: [id number]             PCP: Patient, No Pcp Per Referring: No ref. provider found Visit Date: 05/03/2018 Occupation: @GUAROCC @  Subjective:  No chief complaint on file.   History of Present Illness: Robin Rivera is a 62 y.o. female ***   Activities of Daily Living:  Patient reports morning stiffness for *** {minute/hour:19697}.   Patient {ACTIONS;DENIES/REPORTS:21021675::"Denies"} nocturnal pain.  Difficulty dressing/grooming: {ACTIONS;DENIES/REPORTS:21021675::"Denies"} Difficulty climbing stairs: {ACTIONS;DENIES/REPORTS:21021675::"Denies"} Difficulty getting out of chair: {ACTIONS;DENIES/REPORTS:21021675::"Denies"} Difficulty using hands for taps, buttons, cutlery, and/or writing: {ACTIONS;DENIES/REPORTS:21021675::"Denies"}  No Rheumatology ROS completed.   PMFS History:  Patient Active Problem List   Diagnosis Date Noted  . Monoclonal gammopathy 10/22/2016  . Age-related osteoporosis without current pathological fracture 09/22/2016  . Vitamin D deficiency 09/22/2016  . Routine general medical examination at a health care facility 05/02/2016  . Underweight 05/02/2016  . GERD (gastroesophageal reflux disease) 03/29/2016  . Eye irritation 02/15/2016  . Dysphagia 02/04/2016  . Atypical chest pain 02/04/2016  . Coronary artery calcification seen on CAT scan 02/04/2016  . Chronic obstructive pulmonary disease ([place]) 08/16/2013  . TOBACCO ABUSE 01/09/2009    Past Medical History:  Diagnosis Date  . COPD (chronic obstructive pulmonary disease) ([place])   . Esophageal stricture   . GERD (gastroesophageal reflux disease)   . Monoclonal gammopathy    unknown significance, followed at [facility].   . Osteoporosis   . Rhinitis   . Tobacco abuse     Family History  Adopted: Yes  Problem Relation Age of Onset  . COPD Mother   . Kidney disease Mother   .  Colon cancer Maternal Aunt   . Healthy Son   . Healthy Son   . Healthy Son    Past Surgical History:  Procedure Laterality Date  . BREAST BIOPSY    . BREAST EXCISIONAL BIOPSY Right   . BREAST SURGERY Bilateral    lumpectomy   . CESAREAN SECTION    . TONSILLECTOMY    . TOTAL ABDOMINAL HYSTERECTOMY     Partial hysterectomy   Social History   Social History Narrative   Fun: Play games on her computer, bon fires   Denies abuse and feels safe at home.     Objective: Vital Signs: There were no vitals taken for this visit.   Physical Exam   Musculoskeletal Exam: ***  CDAI Exam: CDAI Score: Not documented Patient Global Assessment: Not documented; Provider Global Assessment: Not documented Swollen: Not documented; Tender: Not documented Joint Exam   Not documented   There is currently no information documented on the homunculus. Go to the Rheumatology activity and complete the homunculus joint exam.  Investigation: No additional findings.  Imaging: No results found.  Recent Labs: Lab Results  Component Value Date   WBC 4.9 10/24/2017   HGB 13.6 10/24/2017   PLT 285 10/24/2017   NA 138 11/20/2017   K 3.7 11/20/2017   CL 103 11/20/2017   CO2 29 11/20/2017   GLUCOSE 99 11/20/2017   BUN 13 11/20/2017   CREATININE 0.80 11/20/2017   BILITOT 0.2 11/20/2017   ALKPHOS 105 10/27/2016   AST 11 11/20/2017   ALT 9 11/20/2017   PROT 6.8 11/20/2017   ALBUMIN 4.0 10/27/2016   CALCIUM 9.2 11/20/2017  GFRAA 92 11/20/2017    Speciality Comments: No specialty comments available.  Procedures:  No procedures performed Allergies: Patient has no known allergies.   Assessment / Plan:     Visit Diagnoses: No diagnosis found.   Orders: No orders of the defined types were placed in this encounter.  No orders of the defined types were placed in this encounter.   Face-to-face time spent with patient was *** minutes. Greater than 50% of time was spent in counseling and  coordination of care.  Follow-Up Instructions: No follow-ups on file.   Earnestine Mealing, CMA  Note - This record has been created using Editor, commissioning.  Chart creation errors have been sought, but may not always  have been located. Such creation errors do not reflect on  the standard of medical care.

## 2018-05-03 ENCOUNTER — Ambulatory Visit: Payer: 59 | Admitting: Rheumatology

## 2018-05-03 NOTE — Progress Notes (Signed)
Office Visit Note  Patient: Robin Rivera             Date of Birth: 1955-12-13           MRN: [id number]             PCP: Patient, No Pcp Per Referring: No ref. provider found Visit Date: 05/15/2018 Occupation: @GUAROCC @  Subjective:  Medication management.   History of Present Illness: Robin Rivera is a 62 y.o. female with history of osteoporosis.  Robin Rivera had her last Reclast infusion in May 2019 which Robin Rivera tolerated well.  Robin Rivera is due to have repeat bone density in December.  Been taking calcium and vitamin D.  Robin Rivera has been to seeing hematologist for monoclonal gammopathy which is a stable.   Activities of Daily Living:  Patient reports morning stiffness for 0 minute.   Patient Denies nocturnal pain.  Difficulty dressing/grooming: Denies Difficulty climbing stairs: Denies Difficulty getting out of chair: Denies Difficulty using hands for taps, buttons, cutlery, and/or writing: Denies  Review of Systems  Constitutional: Negative for fatigue, night sweats, weight gain and weight loss.  HENT: Negative for mouth sores, trouble swallowing, trouble swallowing, mouth dryness and nose dryness.   Eyes: Negative for pain, redness, visual disturbance and dryness.  Respiratory: Negative for cough, shortness of breath and difficulty breathing.   Cardiovascular: Negative for chest pain, palpitations, hypertension, irregular heartbeat and swelling in legs/feet.  Gastrointestinal: Negative for blood in stool, constipation and diarrhea.  Endocrine: Negative for increased urination.  Genitourinary: Negative for vaginal dryness.  Musculoskeletal: Negative for arthralgias, joint pain, joint swelling, myalgias, muscle weakness, morning stiffness, muscle tenderness and myalgias.  Skin: Negative for color change, rash, hair loss, skin tightness, ulcers and sensitivity to sunlight.  Allergic/Immunologic: Negative for susceptible to infections.  Neurological: Negative for dizziness, memory loss,  night sweats and weakness.  Hematological: Negative for swollen glands.  Psychiatric/Behavioral: Negative for depressed mood and sleep disturbance. The patient is not nervous/anxious.     PMFS History:  Patient Active Problem List   Diagnosis Date Noted  . Monoclonal gammopathy 10/22/2016  . Age-related osteoporosis without current pathological fracture 09/22/2016  . Vitamin D deficiency 09/22/2016  . Routine general medical examination at a health care facility 05/02/2016  . Underweight 05/02/2016  . GERD (gastroesophageal reflux disease) 03/29/2016  . Eye irritation 02/15/2016  . Dysphagia 02/04/2016  . Atypical chest pain 02/04/2016  . Coronary artery calcification seen on CAT scan 02/04/2016  . Chronic obstructive pulmonary disease ([place]) 08/16/2013  . TOBACCO ABUSE 01/09/2009    Past Medical History:  Diagnosis Date  . COPD (chronic obstructive pulmonary disease) ([place])   . Esophageal stricture   . GERD (gastroesophageal reflux disease)   . Monoclonal gammopathy    unknown significance, followed at [facility].   . Osteoporosis   . Rhinitis   . Tobacco abuse     Family History  Adopted: Yes  Problem Relation Age of Onset  . COPD Mother   . Kidney disease Mother   . Colon cancer Maternal Aunt   . Healthy Son   . Healthy Son   . Healthy Son    Past Surgical History:  Procedure Laterality Date  . BREAST BIOPSY    . BREAST EXCISIONAL BIOPSY Right   . BREAST SURGERY Bilateral    lumpectomy   . CESAREAN SECTION    . TONSILLECTOMY    . TOTAL ABDOMINAL HYSTERECTOMY     Partial hysterectomy   Social  History   Social History Narrative   Fun: Play games on her computer, bon fires   Denies abuse and feels safe at home.     Objective: Vital Signs: BP 101/64 (BP Location: Left Arm, Patient Position: Sitting, Cuff Size: Normal)   Pulse 83   Resp 13   Ht 5' 4.5" (1.638 m)   Wt 108 lb (49 kg)   BMI 18.25 kg/m    Physical Exam  Constitutional:  Robin Rivera is oriented to person, place, and time. Robin Rivera appears well-developed and well-nourished.  HENT:  Head: Normocephalic and atraumatic.  Eyes: Conjunctivae and EOM are normal.  Neck: Normal range of motion.  Cardiovascular: Normal rate, regular rhythm, normal heart sounds and intact distal pulses.  Pulmonary/Chest: Effort normal and breath sounds normal.  Abdominal: Soft. Bowel sounds are normal.  Lymphadenopathy:    Robin Rivera has no cervical adenopathy.  Neurological: Robin Rivera is alert and oriented to person, place, and time.  Skin: Skin is warm and dry. Capillary refill takes less than 2 seconds.  Psychiatric: Robin Rivera has a normal mood and affect. Her behavior is normal.  Nursing note and vitals reviewed.    Musculoskeletal Exam: C-spine thoracic lumbar spine good range of motion.  Shoulder joints elbow joints wrist joint MCPs PIPs DIPs been good range of motion with no synovitis.  Hip joints knee joints ankles MTPs PIPs were in good range of motion with no synovitis.  CDAI Exam: CDAI Score: Not documented Patient Global Assessment: Not documented; Provider Global Assessment: Not documented Swollen: Not documented; Tender: Not documented Joint Exam   Not documented   There is currently no information documented on the homunculus. Go to the Rheumatology activity and complete the homunculus joint exam.  Investigation: No additional findings.  Imaging: No results found.  Recent Labs: Lab Results  Component Value Date   WBC 4.9 10/24/2017   HGB 13.6 10/24/2017   PLT 285 10/24/2017   NA 138 11/20/2017   K 3.7 11/20/2017   CL 103 11/20/2017   CO2 29 11/20/2017   GLUCOSE 99 11/20/2017   BUN 13 11/20/2017   CREATININE 0.80 11/20/2017   BILITOT 0.2 11/20/2017   ALKPHOS 105 10/27/2016   AST 11 11/20/2017   ALT 9 11/20/2017   PROT 6.8 11/20/2017   ALBUMIN 4.0 10/27/2016   CALCIUM 9.2 11/20/2017   GFRAA 92 11/20/2017    Speciality Comments: No specialty comments  available.  Procedures:  No procedures performed Allergies: Patient has no known allergies.   Assessment / Plan:     Visit Diagnoses: Age-related osteoporosis without current pathological fracture -Robin Rivera has been taking calcium and vitamin D.  Robin Rivera is tolerated last Reclast infusion which was on Nov 09, 2017.  Her next DEXA scan will be due in December 2019. Last DEXA was on 06/08/2016 T score -3.6 lumbar.  Robin Rivera will schedule DEXA scan through her PCPs office.  I have advised her to return in May so we can do labs prior to her Reclast infusion.  Need for regular resistive exercises was discussed.  History of vitamin D deficiency-Robin Rivera has been taking calcium and vitamin D.  Monoclonal gammopathy - Robin Rivera went to see Hem/ Onc in [place].  History of COPD  History of gastroesophageal reflux (GERD)  Coronary artery calcification - seen on CAT scan   Former smoker   Orders: No orders of the defined types were placed in this encounter.  No orders of the defined types were placed in this encounter.   Follow-Up Instructions: Return in  about 6 months (around 11/13/2018) for Osteoporosis.   Bo Merino, MD  Note - This record has been created using Editor, commissioning.  Chart creation errors have been sought, but may not always  have been located. Such creation errors do not reflect on  the standard of medical care.

## 2018-05-15 ENCOUNTER — Encounter: Payer: Self-pay | Admitting: Rheumatology

## 2018-05-15 ENCOUNTER — Ambulatory Visit: Payer: 59 | Admitting: Rheumatology

## 2018-05-15 VITALS — BP 101/64 | HR 83 | Resp 13 | Ht 64.5 in | Wt 108.0 lb

## 2018-05-15 DIAGNOSIS — Z8639 Personal history of other endocrine, nutritional and metabolic disease: Secondary | ICD-10-CM

## 2018-05-15 DIAGNOSIS — Z8709 Personal history of other diseases of the respiratory system: Secondary | ICD-10-CM

## 2018-05-15 DIAGNOSIS — D472 Monoclonal gammopathy: Secondary | ICD-10-CM | POA: Diagnosis not present

## 2018-05-15 DIAGNOSIS — Z8719 Personal history of other diseases of the digestive system: Secondary | ICD-10-CM | POA: Diagnosis not present

## 2018-05-15 DIAGNOSIS — I2584 Coronary atherosclerosis due to calcified coronary lesion: Secondary | ICD-10-CM

## 2018-05-15 DIAGNOSIS — Z87891 Personal history of nicotine dependence: Secondary | ICD-10-CM

## 2018-05-15 DIAGNOSIS — M810 Age-related osteoporosis without current pathological fracture: Secondary | ICD-10-CM

## 2018-05-15 DIAGNOSIS — I2510 Atherosclerotic heart disease of native coronary artery without angina pectoris: Secondary | ICD-10-CM

## 2018-05-16 ENCOUNTER — Encounter: Payer: Self-pay | Admitting: Obstetrics and Gynecology

## 2018-05-16 ENCOUNTER — Telehealth: Payer: Self-pay | Admitting: Obstetrics and Gynecology

## 2018-05-16 DIAGNOSIS — M818 Other osteoporosis without current pathological fracture: Secondary | ICD-10-CM

## 2018-05-16 NOTE — Telephone Encounter (Signed)
Order placed for BMD at [facility].  MyChart message to patient to notify.  Encounter closed.

## 2018-05-16 NOTE — Telephone Encounter (Signed)
Last BMD 06/08/16 at [facility]: Osteoporosis. Started Reclast in 2018. Followed by Orthopedics.    Dr. Talbert Nan -ok to proceed with order for BMD to [facility]?

## 2018-05-16 NOTE — Telephone Encounter (Signed)
Patient sent the following correspondence through [place]. Routing to triage to assist patient with request.  I NEED A BONE SCAN DONE NEXT MONTH, AND DR. Estanislado Pandy CAN'T ORDER IT FOR IT IS A CONFLICT OF INTEREST FOR HER TO ORDER FOR [place] [facility]. SO CAN YOU ORDER ONE FOR ME. I WOULD REALLY GREATLY APPRECIATE IT. [place], Robin Rivera

## 2018-05-16 NOTE — Telephone Encounter (Signed)
Sure, order it and send a copy to her Orthopedist

## 2018-05-29 ENCOUNTER — Other Ambulatory Visit: Payer: Self-pay | Admitting: Obstetrics and Gynecology

## 2018-05-29 DIAGNOSIS — Z1231 Encounter for screening mammogram for malignant neoplasm of breast: Secondary | ICD-10-CM

## 2018-07-20 ENCOUNTER — Other Ambulatory Visit: Payer: Self-pay | Admitting: Acute Care

## 2018-07-20 MED ORDER — ALBUTEROL SULFATE HFA 108 (90 BASE) MCG/ACT IN AERS: 2.0000 | INHALATION_SPRAY | Freq: Four times a day (QID) | RESPIRATORY_TRACT | 5 refills | Status: DC | PRN

## 2018-07-20 MED FILL — VENTOLIN HFA 90 MCG INHALER: 108 (90 BAS | 25 days supply | Qty: 18 | Fill #0

## 2018-07-20 NOTE — Progress Notes (Signed)
Robin Rivera called today.  Her Albuterol had run out. No acute issues.  I refilled her albuterol HFA   Erick Colace ACNP-BC [place] Pager # [phone] OR # [phone] if no answer

## 2018-08-06 ENCOUNTER — Other Ambulatory Visit: Payer: Self-pay

## 2018-08-06 ENCOUNTER — Ambulatory Visit: Payer: 59 | Admitting: Obstetrics and Gynecology

## 2018-08-06 ENCOUNTER — Encounter: Payer: Self-pay | Admitting: Obstetrics and Gynecology

## 2018-08-06 VITALS — BP 102/76 | HR 84 | Ht 64.75 in | Wt 104.0 lb

## 2018-08-06 DIAGNOSIS — Z01419 Encounter for gynecological examination (general) (routine) without abnormal findings: Secondary | ICD-10-CM | POA: Diagnosis not present

## 2018-08-06 DIAGNOSIS — Z8739 Personal history of other diseases of the musculoskeletal system and connective tissue: Secondary | ICD-10-CM

## 2018-08-06 DIAGNOSIS — F17200 Nicotine dependence, unspecified, uncomplicated: Secondary | ICD-10-CM

## 2018-08-06 NOTE — Patient Instructions (Signed)
EXERCISE AND DIET:  We recommended that you start or continue a regular exercise program for good health. Regular exercise means any activity that makes your heart beat faster and makes you sweat.  We recommend exercising at least 30 minutes per day at least 3 days a week, preferably 4 or 5.  We also recommend a diet low in fat and sugar.  Inactivity, poor dietary choices and obesity can cause diabetes, heart attack, stroke, and kidney damage, among others.    ALCOHOL AND SMOKING:  Women should limit their alcohol intake to no more than 7 drinks/beers/glasses of wine (combined, not each!) per week. Moderation of alcohol intake to this level decreases your risk of breast cancer and liver damage. And of course, no recreational drugs are part of a healthy lifestyle.  And absolutely no smoking or even second hand smoke. Most people know smoking can cause heart and lung diseases, but did you know it also contributes to weakening of your bones? Aging of your skin?  Yellowing of your teeth and nails?  CALCIUM AND VITAMIN D:  Adequate intake of calcium and Vitamin D are recommended.  The recommendations for exact amounts of these supplements seem to change often, but generally speaking 1,200 mg of calcium (between diet and supplement) and 800 units of Vitamin D per day seems prudent. Certain women may benefit from higher intake of Vitamin D.  If you are among these women, your doctor will have told you during your visit.    PAP SMEARS:  Pap smears, to check for cervical cancer or precancers,  have traditionally been done yearly, although recent scientific advances have shown that most women can have pap smears less often.  However, every woman still should have a physical exam from her gynecologist every year. It will include a breast check, inspection of the vulva and vagina to check for abnormal growths or skin changes, a visual exam of the cervix, and then an exam to evaluate the size and shape of the uterus and  ovaries.  And after 63 years of age, a rectal exam is indicated to check for rectal cancers. We will also provide age appropriate advice regarding health maintenance, like when you should have certain vaccines, screening for sexually transmitted diseases, bone density testing, colonoscopy, mammograms, etc.   MAMMOGRAMS:  All women over 40 years old should have a yearly mammogram. Many facilities now offer a "3D" mammogram, which may cost around $50 extra out of pocket. If possible,  we recommend you accept the option to have the 3D mammogram performed.  It both reduces the number of women who will be called back for extra views which then turn out to be normal, and it is better than the routine mammogram at detecting truly abnormal areas.    COLON CANCER SCREENING: Now recommend starting at age 45. At this time colonoscopy is not covered for routine screening until 50. There are take home tests that can be done between 45-49.   COLONOSCOPY:  Colonoscopy to screen for colon cancer is recommended for all women at age 50.  We know, you hate the idea of the prep.  We agree, BUT, having colon cancer and not knowing it is worse!!  Colon cancer so often starts as a polyp that can be seen and removed at colonscopy, which can quite literally save your life!  And if your first colonoscopy is normal and you have no family history of colon cancer, most women don't have to have it again for   10 years.  Once every ten years, you can do something that may end up saving your life, right?  We will be happy to help you get it scheduled when you are ready.  Be sure to check your insurance coverage so you understand how much it will cost.  It may be covered as a preventative service at no cost, but you should check your particular policy.      Breast Self-Awareness Breast self-awareness means being familiar with how your breasts look and feel. It involves checking your breasts regularly and reporting any changes to your  health care provider. Practicing breast self-awareness is important. A change in your breasts can be a sign of a serious medical problem. Being familiar with how your breasts look and feel allows you to find any problems early, when treatment is more likely to be successful. All women should practice breast self-awareness, including women who have had breast implants. How to do a breast self-exam One way to learn what is normal for your breasts and whether your breasts are changing is to do a breast self-exam. To do a breast self-exam: Look for Changes  1. Remove all the clothing above your waist. 2. Stand in front of a mirror in a room with good lighting. 3. Put your hands on your hips. 4. Push your hands firmly downward. 5. Compare your breasts in the mirror. Look for differences between them (asymmetry), such as: ? Differences in shape. ? Differences in size. ? Puckers, dips, and bumps in one breast and not the other. 6. Look at each breast for changes in your skin, such as: ? Redness. ? Scaly areas. 7. Look for changes in your nipples, such as: ? Discharge. ? Bleeding. ? Dimpling. ? Redness. ? A change in position. Feel for Changes Carefully feel your breasts for lumps and changes. It is best to do this while lying on your back on the floor and again while sitting or standing in the shower or tub with soapy water on your skin. Feel each breast in the following way:  Place the arm on the side of the breast you are examining above your head.  Feel your breast with the other hand.  Start in the nipple area and make  inch (2 cm) overlapping circles to feel your breast. Use the pads of your three middle fingers to do this. Apply light pressure, then medium pressure, then firm pressure. The light pressure will allow you to feel the tissue closest to the skin. The medium pressure will allow you to feel the tissue that is a little deeper. The firm pressure will allow you to feel the tissue  close to the ribs.  Continue the overlapping circles, moving downward over the breast until you feel your ribs below your breast.  Move one finger-width toward the center of the body. Continue to use the  inch (2 cm) overlapping circles to feel your breast as you move slowly up toward your collarbone.  Continue the up and down exam using all three pressures until you reach your armpit.  Write Down What You Find  Write down what is normal for each breast and any changes that you find. Keep a written record with breast changes or normal findings for each breast. By writing this information down, you do not need to depend only on memory for size, tenderness, or location. Write down where you are in your menstrual cycle, if you are still menstruating. If you are having trouble noticing differences   in your breasts, do not get discouraged. With time you will become more familiar with the variations in your breasts and more comfortable with the exam. How often should I examine my breasts? Examine your breasts every month. If you are breastfeeding, the best time to examine your breasts is after a feeding or after using a breast pump. If you menstruate, the best time to examine your breasts is 5-7 days after your period is over. During your period, your breasts are lumpier, and it may be more difficult to notice changes. When should I see my health care provider? See your health care provider if you notice:  A change in shape or size of your breasts or nipples.  A change in the skin of your breast or nipples, such as a reddened or scaly area.  Unusual discharge from your nipples.  A lump or thick area that was not there before.  Pain in your breasts.  Anything that concerns you.  

## 2018-08-06 NOTE — Progress Notes (Signed)
63 y.o. G66P3003 Married White or Caucasian Not Hispanic or Latino female here for annual exam.  H/O TAH in the 46's for endometriosis. Still has her ovaries. Not sexually active, secondary to pain. She has osteoporosis, on Reclast with Orthopedics.      No LMP recorded. Patient has had a hysterectomy.          Sexually active: Yes.    The current method of family planning is coitus interruptus and status post hysterectomy.    Exercising: Yes.    walking Smoker:  Yes, 1/2 PPD  Health Maintenance: Pap:  Unsure, h/o hysterectomy  History of abnormal Pap:  no MMG: 08/14/2017 Birads 1 negative Colonoscopy:  07-28-16 polyps- repeat in 3 yrs BMD:   07/2017 WNL per patient TDaP:  05-02-16 Gardasil: N/A   reports that she has been smoking cigarettes. She has a 23.00 pack-year smoking history. She has never used smokeless tobacco. She reports that she does not drink alcohol or use drugs. 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 3 grown kids, 2 live at home (both work). 3rd in [place] with his wife. She has 5 grandchildren (no contact with 3 of them). Her biological mother lives with her (didn't meet her until she was an adult), stressful.    Past Medical History:  Diagnosis Date  . COPD (chronic obstructive pulmonary disease) ([place])   . Esophageal stricture   . GERD (gastroesophageal reflux disease)   . Monoclonal gammopathy    unknown significance, followed at [facility].   . Osteoporosis   . Rhinitis   . Tobacco abuse     Past Surgical History:  Procedure Laterality Date  . BREAST BIOPSY    . BREAST EXCISIONAL BIOPSY Right   . BREAST SURGERY Bilateral    lumpectomy   . CESAREAN SECTION    . TONSILLECTOMY    . TOTAL ABDOMINAL HYSTERECTOMY     Partial hysterectomy    Current Outpatient Medications  Medication Sig Dispense Refill  . albuterol (PROVENTIL HFA;VENTOLIN HFA) 108 (90 Base) MCG/ACT inhaler Inhale 2 puffs into the lungs every 6 (six) hours  as needed. 1 Inhaler 5  . Calcium Carbonate-Vitamin D3 (CALCIUM 600-D) 600-400 MG-UNIT TABS Take 2 tablets by mouth daily.    Marland Kitchen omeprazole (PRILOSEC) 40 MG capsule Take 1 capsule (40 mg total) by mouth daily. 90 capsule 3  . Tiotropium Bromide-Olodaterol (STIOLTO RESPIMAT) 2.5-2.5 MCG/ACT AERS Inhale into the lungs.     Current Facility-Administered Medications  Medication Dose Route Frequency Provider Last Rate Last Dose  . 0.9 %  sodium chloride infusion  500 mL Intravenous Continuous Irene Shipper, MD      . 0.9 %  sodium chloride infusion  500 mL Intravenous Continuous Irene Shipper, MD        Family History  Adopted: Yes  Problem Relation Age of Onset  . COPD Mother   . Kidney disease Mother   . Colon cancer Maternal Aunt   . Healthy Son   . Healthy Son   . Healthy Son     Review of Systems  Constitutional: Negative.   HENT: Negative.   Eyes: Negative.   Respiratory: Negative.   Cardiovascular: Negative.   Gastrointestinal: Negative.   Endocrine: Negative.   Genitourinary: Negative.   Musculoskeletal: Negative.   Skin: Negative.   Allergic/Immunologic: Negative.   Neurological: Negative.   Hematological: Negative.   Psychiatric/Behavioral: Negative.     Exam:   BP 102/76 (BP Location:  Right Arm, Patient Position: Sitting, Cuff Size: Normal)   Pulse 84   Ht 5' 4.75" (1.645 m)   Wt 104 lb (47.2 kg)   BMI 17.44 kg/m   Weight change: @WEIGHTCHANGE @ Height:   Height: 5' 4.75" (164.5 cm)  Ht Readings from Last 3 Encounters:  08/06/18 5' 4.75" (1.645 m)  05/15/18 5' 4.5" (1.638 m)  11/09/17 5\' 4"  (1.626 m)    General appearance: alert, cooperative and appears stated age Head: Normocephalic, without obvious abnormality, atraumatic Neck: no adenopathy, supple, symmetrical, trachea midline and thyroid normal to inspection and palpation Lungs: clear to auscultation bilaterally Cardiovascular: regular rate and rhythm Breasts: normal appearance, no masses or  tenderness Abdomen: soft, non-tender; non distended,  no masses,  no organomegaly Extremities: extremities normal, atraumatic, no cyanosis or edema Skin: Skin color, texture, turgor normal. No rashes or lesions Lymph nodes: Cervical, supraclavicular, and axillary nodes normal. No abnormal inguinal nodes palpated Neurologic: Grossly normal   Pelvic: External genitalia:  no lesions              Urethra:  normal appearing urethra with no masses, tenderness or lesions              Bartholins and Skenes: normal                 Vagina: atrophic appearing vagina with normal color and discharge, no lesions              Cervix: absent               Bimanual Exam:  Uterus:  uterus absent              Adnexa: no mass, fullness, tenderness               Rectovaginal: Confirms               Anus:  normal sphincter tone, no lesions  Chaperone was present for exam.  A:  Well Woman with normal exam  Osteoporosis, followed by Ortho  Under increased stress with biological mother living with her  P:   No pap needed  Mammogram due  Colonoscopy next year  Labs with primary  Discussed breast self exam  Discussed calcium and vit D intake

## 2018-08-15 ENCOUNTER — Other Ambulatory Visit: Payer: 59

## 2018-08-15 ENCOUNTER — Inpatient Hospital Stay: Admission: RE | Admit: 2018-08-15 | Payer: 59 | Source: Ambulatory Visit

## 2018-10-16 ENCOUNTER — Ambulatory Visit: Payer: 59

## 2018-10-16 ENCOUNTER — Other Ambulatory Visit: Payer: 59

## 2018-11-01 NOTE — Progress Notes (Signed)
Office Visit Note  Patient: Robin Rivera             Date of Birth: March 26, 1956           MRN: [id number]             PCP: Patient, No Pcp Per Referring: No ref. provider found Visit Date: 11/13/2018 Occupation: @GUAROCC @  Subjective:  Pain in legs.  Last DEXA was on 06/08/2016 T score -3.6 lumbar.  She has been taking calcium and vitamin D.  Second Reclast infusion was on Nov 09, 2017.  She was due for her DEXA December 2019.   History of Present Illness: Robin Rivera is a 63 y.o. female with history of osteoporosis.  She states her bone density was delayed due to recent pandemic.  It is a scheduled for June 25.  She had her last Reclast on Nov 09, 2017.  She states recently she has been experiencing pain in her left lower extremity which she describes over her lateral aspect of the thigh.  She states sometimes she has pain in her right lower extremity on the lateral aspect of the calf.  She states that the pain feels very deep in her bone and can last for 2 to 3 days and then resolves by itself.  These episodes have been recurrent.  She denies any joint pain.  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  She has been experiencing some muscle cramps at nighttime mostly in her calves and toes.  Activities of Daily Living:  Patient reports morning stiffness for 0 minute.   Patient Denies nocturnal pain.  Difficulty dressing/grooming: Denies Difficulty climbing stairs: Denies Difficulty getting out of chair: Denies Difficulty using hands for taps, buttons, cutlery, and/or writing: Denies  Review of Systems  Constitutional: Negative for fatigue, night sweats, weight gain and weight loss.  HENT: Negative for mouth sores, trouble swallowing, trouble swallowing, mouth dryness and nose dryness.   Eyes: Negative for pain, redness, visual disturbance and dryness.  Respiratory: Negative for cough, shortness of breath and difficulty breathing.   Cardiovascular: Negative for chest pain, palpitations,  hypertension, irregular heartbeat and swelling in legs/feet.  Gastrointestinal: Negative for blood in stool, constipation and diarrhea.  Endocrine: Negative for increased urination.  Genitourinary: Negative for vaginal dryness.  Musculoskeletal: Positive for myalgias and myalgias. Negative for arthralgias, joint pain, joint swelling, muscle weakness, morning stiffness and muscle tenderness.  Skin: Negative for color change, rash, hair loss, skin tightness, ulcers and sensitivity to sunlight.  Allergic/Immunologic: Negative for susceptible to infections.  Neurological: Negative for dizziness, memory loss, night sweats and weakness.  Hematological: Negative for swollen glands.  Psychiatric/Behavioral: Negative for depressed mood and sleep disturbance. The patient is not nervous/anxious.     PMFS History:  Patient Active Problem List   Diagnosis Date Noted  . Monoclonal gammopathy 10/22/2016  . Age-related osteoporosis without current pathological fracture 09/22/2016  . Vitamin D deficiency 09/22/2016  . Routine general medical examination at a health care facility 05/02/2016  . Underweight 05/02/2016  . GERD (gastroesophageal reflux disease) 03/29/2016  . Eye irritation 02/15/2016  . Dysphagia 02/04/2016  . Atypical chest pain 02/04/2016  . Coronary artery calcification seen on CAT scan 02/04/2016  . Chronic obstructive pulmonary disease ([place]) 08/16/2013  . TOBACCO ABUSE 01/09/2009    Past Medical History:  Diagnosis Date  . COPD (chronic obstructive pulmonary disease) ([place])   . Esophageal stricture   . GERD (gastroesophageal reflux disease)   . Monoclonal gammopathy  unknown significance, followed at [facility].   . Osteoporosis   . Rhinitis   . Tobacco abuse     Family History  Adopted: Yes  Problem Relation Age of Onset  . COPD Mother   . Kidney disease Mother   . Colon cancer Maternal Aunt   . Healthy Son   . Healthy Son   . Healthy Son    Past  Surgical History:  Procedure Laterality Date  . BREAST BIOPSY    . BREAST EXCISIONAL BIOPSY Right   . BREAST SURGERY Bilateral    lumpectomy   . CESAREAN SECTION    . TONSILLECTOMY    . TOTAL ABDOMINAL HYSTERECTOMY     Partial hysterectomy   Social History   Social History Narrative   Fun: Play games on her computer, bon fires   Denies abuse and feels safe at home.    Immunization History  Administered Date(s) Administered  . Influenza Split 04/12/2011, 04/15/2013, 03/25/2014, 03/22/2016  . Influenza,inj,Quad PF,6+ Mos 03/21/2015  . Pneumococcal Polysaccharide-23 02/15/2016  . Tdap 05/02/2016     Objective: Vital Signs: BP 114/75 (BP Location: Left Arm, Patient Position: Sitting, Cuff Size: Normal)   Pulse 78   Resp 13   Ht 5' 4.5" (1.638 m)   Wt 101 lb (45.8 kg)   BMI 17.07 kg/m    Physical Exam Vitals signs and nursing note reviewed.  Constitutional:      Appearance: She is well-developed.  HENT:     Head: Normocephalic and atraumatic.  Eyes:     Conjunctiva/sclera: Conjunctivae normal.  Neck:     Musculoskeletal: Normal range of motion.  Cardiovascular:     Rate and Rhythm: Normal rate and regular rhythm.     Heart sounds: Normal heart sounds.  Pulmonary:     Effort: Pulmonary effort is normal.     Breath sounds: Normal breath sounds.  Abdominal:     General: Bowel sounds are normal.     Palpations: Abdomen is soft.  Lymphadenopathy:     Cervical: No cervical adenopathy.  Skin:    General: Skin is warm and dry.     Capillary Refill: Capillary refill takes less than 2 seconds.  Neurological:     Mental Status: She is alert and oriented to person, place, and time.  Psychiatric:        Behavior: Behavior normal.      Musculoskeletal Exam: C-spine thoracic and lumbar spine good range of motion.  Shoulder joints elbow joints wrist joint MCPs PIPs DIPs been good range of motion with no synovitis.  Hip joints knee joints ankles MTPs PIPs DIPs been good  range of motion with no synovitis.  She had no reproducible pain on palpation of femur or tibia.  CDAI Exam: CDAI Score: Not documented Patient Global Assessment: Not documented; Provider Global Assessment: Not documented Swollen: Not documented; Tender: Not documented Joint Exam   Not documented   There is currently no information documented on the homunculus. Go to the Rheumatology activity and complete the homunculus joint exam.  Investigation: No additional findings.  Imaging: No results found.  Recent Labs: Lab Results  Component Value Date   WBC 4.9 10/24/2017   HGB 13.6 10/24/2017   PLT 285 10/24/2017   NA 138 11/20/2017   K 3.7 11/20/2017   CL 103 11/20/2017   CO2 29 11/20/2017   GLUCOSE 99 11/20/2017   BUN 13 11/20/2017   CREATININE 0.80 11/20/2017   BILITOT 0.2 11/20/2017   ALKPHOS  105 10/27/2016   AST 11 11/20/2017   ALT 9 11/20/2017   PROT 6.8 11/20/2017   ALBUMIN 4.0 10/27/2016   CALCIUM 9.2 11/20/2017   GFRAA 92 11/20/2017    Speciality Comments: No specialty comments available.  Procedures:  No procedures performed Allergies: Patient has no known allergies.   Assessment / Plan:     Visit Diagnoses: Age-related osteoporosis without current pathological fracture - DEXA scheduled 12/14/18. Last DEXA was on 06/08/2016 T score -3.6 lumbar.  Reclast IV 11/09/17.  Patient states the DEXA scan was delayed secondary to pandemic.  She will contact us after the bone density is done that day she can receive her next Reclast infusion.  She has been taking calcium and vitamin D.  Myalgia-she complains of increased muscle spasms recently.  She states she is not drinking enough fluids during daytime.  Pain of left femur -she has been experiencing discomfort in the left femur which is episodic and comes and goes.  She had no bony tenderness on examination.  No muscular tenderness was noted.  Plan: XR FEMUR MIN 2 VIEWS LEFT.  The x-ray was unremarkable.  Pain in right  lower leg -she complains of pain in her right lower extremity as well.  The symptoms are also episodic.  No tenderness was palpable over tibia or fibula.  Plan: XR Tibia/Fibula Right.  The x-ray was unremarkable.  History of vitamin D deficiency-she is on vitamin D.  Monoclonal gammopathy  History of COPD  History of gastroesophageal reflux (GERD)  Coronary artery calcification  Former smoker   Orders: Orders Placed This Encounter  Procedures  . XR FEMUR MIN 2 VIEWS LEFT  . XR Tibia/Fibula Right   No orders of the defined types were placed in this encounter.   Face-to-face time spent with patient was 25 minutes. Greater than 50% of time was spent in counseling and coordination of care.  Follow-Up Instructions: Return in about 6 months (around 05/16/2019) for Osteoporosis.   Bo Merino, MD  Note - This record has been created using Editor, commissioning.  Chart creation errors have been sought, but may not always  have been located. Such creation errors do not reflect on  the standard of medical care.

## 2018-11-13 ENCOUNTER — Encounter: Payer: Self-pay | Admitting: Rheumatology

## 2018-11-13 ENCOUNTER — Other Ambulatory Visit: Payer: Self-pay

## 2018-11-13 ENCOUNTER — Ambulatory Visit: Payer: 59 | Admitting: Rheumatology

## 2018-11-13 ENCOUNTER — Ambulatory Visit (INDEPENDENT_AMBULATORY_CARE_PROVIDER_SITE_OTHER): Payer: 59

## 2018-11-13 VITALS — BP 114/75 | HR 78 | Resp 13 | Ht 64.5 in | Wt 101.0 lb

## 2018-11-13 DIAGNOSIS — M791 Myalgia, unspecified site: Secondary | ICD-10-CM

## 2018-11-13 DIAGNOSIS — Z8719 Personal history of other diseases of the digestive system: Secondary | ICD-10-CM | POA: Diagnosis not present

## 2018-11-13 DIAGNOSIS — M810 Age-related osteoporosis without current pathological fracture: Secondary | ICD-10-CM

## 2018-11-13 DIAGNOSIS — D472 Monoclonal gammopathy: Secondary | ICD-10-CM | POA: Diagnosis not present

## 2018-11-13 DIAGNOSIS — Z8709 Personal history of other diseases of the respiratory system: Secondary | ICD-10-CM | POA: Diagnosis not present

## 2018-11-13 DIAGNOSIS — M79661 Pain in right lower leg: Secondary | ICD-10-CM | POA: Diagnosis not present

## 2018-11-13 DIAGNOSIS — I2584 Coronary atherosclerosis due to calcified coronary lesion: Secondary | ICD-10-CM

## 2018-11-13 DIAGNOSIS — Z8639 Personal history of other endocrine, nutritional and metabolic disease: Secondary | ICD-10-CM

## 2018-11-13 DIAGNOSIS — I2510 Atherosclerotic heart disease of native coronary artery without angina pectoris: Secondary | ICD-10-CM | POA: Diagnosis not present

## 2018-11-13 DIAGNOSIS — M898X5 Other specified disorders of bone, thigh: Secondary | ICD-10-CM | POA: Diagnosis not present

## 2018-11-13 DIAGNOSIS — Z87891 Personal history of nicotine dependence: Secondary | ICD-10-CM

## 2018-11-30 DIAGNOSIS — D472 Monoclonal gammopathy: Secondary | ICD-10-CM | POA: Diagnosis not present

## 2018-12-10 DIAGNOSIS — D472 Monoclonal gammopathy: Secondary | ICD-10-CM | POA: Diagnosis not present

## 2018-12-14 ENCOUNTER — Other Ambulatory Visit: Payer: Self-pay

## 2018-12-14 ENCOUNTER — Ambulatory Visit
Admission: RE | Admit: 2018-12-14 | Discharge: 2018-12-14 | Disposition: A | Discharge status: Home / Self Care | Payer: 59 | Source: Ambulatory Visit | Attending: Obstetrics and Gynecology | Admitting: Obstetrics and Gynecology

## 2018-12-14 DIAGNOSIS — M810 Age-related osteoporosis without current pathological fracture: Secondary | ICD-10-CM | POA: Diagnosis not present

## 2018-12-14 DIAGNOSIS — M818 Other osteoporosis without current pathological fracture: Secondary | ICD-10-CM

## 2018-12-14 DIAGNOSIS — Z1231 Encounter for screening mammogram for malignant neoplasm of breast: Secondary | ICD-10-CM | POA: Diagnosis not present

## 2018-12-17 MED FILL — ALBUTEROL SULFATE HFA 108 (: 108 (90 BAS | 25 days supply | Qty: 18 | Fill #1

## 2018-12-17 NOTE — Progress Notes (Signed)
I reviewed the DEXA results.  Based on her bone density she will need Forteo or Tymlos.  Please schedule appointment to discuss treatment options.

## 2018-12-18 NOTE — Progress Notes (Signed)
Prior Authorization request through [facility] for TYMLOS.  Authorization has been submitted to patient's insurance via Cover My Meds. Will update once we receive a response.  PA Case ID: [id number]

## 2018-12-20 ENCOUNTER — Telehealth: Payer: Self-pay | Admitting: Pharmacy Technician

## 2018-12-20 NOTE — Telephone Encounter (Signed)
Received a Prior Authorization request from [facility] for TYMLOS. Authorization has been submitted to patient's insurance via Cover My Meds.  Received response back from [facility] that this medication does not require a prior authorization because it is a covered benefit.  Ran test claim- patient has a $461.00 copay for 1 month supply. Patient has a commercial plan so she is copay card eligible.  Medimpact# [phone]   9:21 AM Beatriz Chancellor, CPhT

## 2018-12-26 NOTE — Telephone Encounter (Signed)
Patient returned call.  Notified of Tymlos approval.  Informed she is eligile for co-pay card to bring her $461.00 copay down to 4 dollars.  Patient verbalized understanding.  She is nervous about self-injecting.  Scheduled an appointment for injection technique training for Monday 7/13 at [place].  Will give co-pay card and send prescription at appointment.

## 2018-12-26 NOTE — Telephone Encounter (Signed)
Called and left message with husband to return call.

## 2018-12-27 NOTE — Progress Notes (Signed)
Office Visit Note  Patient: Robin Rivera             Date of Birth: [birth year]           MRN: [id number]             PCP: Patient, No Pcp Per Referring: No ref. provider found Visit Date: 01/07/2019 Occupation: @GUAROCC @  Subjective:  Discuss Tymlos     History of Present Illness: Sreeja Spies is a 63 y.o. female with history of osteoporosis.  She has been receiving IV Reclast infusions.  Her last infusion was on 11/09/17.  She continues to take calcium and vitamin D.  She had an updated DEXA on 12/14/2018.  She is here to discuss starting on Tymlos.  She does not have any questions or concerns or tenderness at this time.  She has not had any recent injuries or falls.  She has not any fractures recently.   She denies any joint pain or joint swelling.    Activities of Daily Living:  Patient reports morning stiffness for 0 none.   Patient Denies nocturnal pain.  Difficulty dressing/grooming: Denies Difficulty climbing stairs: Denies Difficulty getting out of chair: Denies Difficulty using hands for taps, buttons, cutlery, and/or writing: Denies  Review of Systems  Constitutional: Negative for fatigue.  HENT: Negative for mouth dryness.   Eyes: Negative for dryness.  Respiratory: Negative for shortness of breath.   Cardiovascular: Negative for swelling in legs/feet.  Gastrointestinal: Negative for constipation.  Endocrine: Negative for excessive thirst.  Genitourinary: Negative for difficulty urinating.  Musculoskeletal: Negative for arthralgias and joint pain.  Skin: Negative for rash.  Allergic/Immunologic: Negative for susceptible to infections.  Neurological: Negative for weakness.  Hematological: Negative for bruising/bleeding tendency.  Psychiatric/Behavioral: Negative for sleep disturbance.    PMFS History:  Patient Active Problem List   Diagnosis Date Noted  . Monoclonal gammopathy 10/22/2016  . Age-related osteoporosis without current pathological  fracture 09/22/2016  . Vitamin D deficiency 09/22/2016  . Routine general medical examination at a health care facility 05/02/2016  . Underweight 05/02/2016  . GERD (gastroesophageal reflux disease) 03/29/2016  . Eye irritation 02/15/2016  . Dysphagia 02/04/2016  . Atypical chest pain 02/04/2016  . Coronary artery calcification seen on CAT scan 02/04/2016  . Chronic obstructive pulmonary disease ([place]) 08/16/2013  . TOBACCO ABUSE 01/09/2009    Past Medical History:  Diagnosis Date  . COPD (chronic obstructive pulmonary disease) ([place])   . Esophageal stricture   . GERD (gastroesophageal reflux disease)   . Monoclonal gammopathy    unknown significance, followed at [facility].   . Osteoporosis   . Rhinitis   . Tobacco abuse     Family History  Adopted: Yes  Problem Relation Age of Onset  . COPD Mother   . Kidney disease Mother   . Colon cancer Maternal Aunt   . Healthy Son   . Healthy Son   . Healthy Son    Past Surgical History:  Procedure Laterality Date  . BREAST BIOPSY    . BREAST EXCISIONAL BIOPSY Right   . BREAST SURGERY Bilateral    lumpectomy   . CESAREAN SECTION    . TONSILLECTOMY    . TOTAL ABDOMINAL HYSTERECTOMY     Partial hysterectomy   Social History   Social History Narrative   Fun: Play games on her computer, bon fires   Denies abuse and feels safe at home.    Immunization History  Administered Date(s) Administered  .  Influenza Split 04/12/2011, 04/15/2013, 03/25/2014, 03/22/2016  . Influenza,inj,Quad PF,6+ Mos 03/21/2015  . Pneumococcal Polysaccharide-23 02/15/2016  . Tdap 05/02/2016     Objective: Vital Signs: BP (!) 96/59 (BP Location: Left Arm, Patient Position: Sitting, Cuff Size: Normal)   Pulse 63   Resp 14   Ht 5\' 5"  (1.651 m)   Wt 100 lb (45.4 kg)   BMI 16.64 kg/m    Physical Exam Vitals signs and nursing note reviewed.  Constitutional:      Appearance: She is well-developed.  HENT:     Head: Normocephalic  and atraumatic.  Eyes:     Conjunctiva/sclera: Conjunctivae normal.  Neck:     Musculoskeletal: Normal range of motion.  Cardiovascular:     Rate and Rhythm: Normal rate and regular rhythm.     Heart sounds: Normal heart sounds.  Pulmonary:     Effort: Pulmonary effort is normal.     Breath sounds: Normal breath sounds.  Abdominal:     General: Bowel sounds are normal.     Palpations: Abdomen is soft.  Lymphadenopathy:     Cervical: No cervical adenopathy.  Skin:    General: Skin is warm and dry.     Capillary Refill: Capillary refill takes less than 2 seconds.  Neurological:     Mental Status: She is alert and oriented to person, place, and time.  Psychiatric:        Behavior: Behavior normal.      Musculoskeletal Exam: C-spine, thoracic spine, lumbar spine good range of motion.  No midline spinal tenderness.  No SI joint tenderness.  Shoulders, elbows, wrist joints, MCPs, PIPs, DIPs good range of motion with no synovitis.  She has complete fist formation bilaterally.  Hip joints, knee joints, ankle joints, MTPs, PIPs, DIPs good range of motion with no synovitis.  No warmth or effusion of bilateral knee joints.  No tenderness or swelling of ankle joints.  No tenderness over trochanteric bursa bilaterally.  CDAI Exam: CDAI Score: - Patient Global: -; Provider Global: - Swollen: -; Tender: - Joint Exam   No joint exam has been documented for this visit   There is currently no information documented on the homunculus. Go to the Rheumatology activity and complete the homunculus joint exam.  Investigation: No additional findings.  Imaging: Dg Bone Density  Result Date: 12/14/2018 EXAM: DUAL X-RAY ABSORPTIOMETRY (DXA) FOR BONE MINERAL DENSITY IMPRESSION: Referring Physician:  Salvadore Dom Your patient completed a BMD test using Lunar IDXA DXA system ( analysis version: 16 ) manufactured by [employer]. Technologist: AW PATIENT: Name: Reighan, Hipolito Patient ID:  [id number] Birth Date: 03/07/1956 Height: 64.0 in. Sex: Female Measured: 12/14/2018 Weight: 98.4 lbs. Indications: Caucasian, Estrogen Deficient, Hysterectomy, Low Calcium Intake (269.3), Postmenopausal, Tobacco User (Current Smoker), zantac Fractures: None Treatments: Calcium (E943.0) ASSESSMENT: The BMD measured at Femur Total Right is 0.608 g/cm2 with a T-score of -3.2. This patient is considered OSTEOPOROTIC according to [facility]) criteria. There has been a statistically significant increase in BMD of Lumbar spine, and Total Mean since prior exam dated The scan quality is good. Site Region Measured Date Measured Age YA BMD Significant CHANGE AP Spine L1-L4 12/14/2018 63.0 -2.9 0.836 g/cm2 * AP Spine  L1-L4       06/07/2016    60.5         -3.6    0.745 g/cm2 DualFemur Total Right 12/14/2018 63.0 -3.2 0.608 g/cm2 * DualFemur Total Right 06/07/2016    60.5         -  3.5    0.569 g/cm2 DualFemur Total Mean 12/14/2018 63.0 -2.9 0.641 g/cm2 * DualFemur Total Mean  06/07/2016    60.5         -3.3    0.586 g/cm2 World Health Organization [facility]) criteria for post-menopausal, Caucasian Women: Normal       T-score at or above -1 SD Osteopenia   T-score between -1 and -2.5 SD Osteoporosis T-score at or below -2.5 SD RECOMMENDATION: 1. All patients should optimize calcium and vitamin D intake. 2. Consider FDA approved medical therapies in postmenopausal women and men aged 51 years and older, based on the following: a. A hip or vertebral (clinical or morphometric) fracture b. T- score < or = -2.5 at the femoral neck or spine after appropriate evaluation to exclude secondary causes c. Low bone mass (T-score between -1.0 and -2.5 at the femoral neck or spine) and a 10 year probability of a hip fracture > or = 3% or a 10 year probability of a major osteoporosis-related fracture > or = 20% based on the [place]-adapted WHO algorithm d. Clinician judgment and/or patient preferences may indicate treatment for people  with 10-year fracture probabilities above or below these levels FOLLOW-UP: Patients with diagnosis of osteoporosis or at high risk for fracture should have regular bone mineral density tests. For patients eligible for Medicare, routine testing is allowed once every 2 years. The testing frequency can be increased to one year for patients who have rapidly progressing disease, those who are receiving or discontinuing medical therapy to restore bone mass, or have additional risk factors. I have reviewed this report and agree with the above findings. Mark A. Thornton Papas, M.D. [facility] Electronically Signed   By: Lavonia Dana M.D.   On: 12/14/2018 16:41   Mm 3d Screen Breast Bilateral  Result Date: 12/17/2018 CLINICAL DATA:  Screening. EXAM: DIGITAL SCREENING BILATERAL MAMMOGRAM WITH TOMO AND CAD COMPARISON:  Previous exam(s). ACR Breast Density Category d: The breast tissue is extremely dense, which lowers the sensitivity of mammography FINDINGS: There are no findings suspicious for malignancy. Images were processed with CAD. IMPRESSION: No mammographic evidence of malignancy. A result letter of this screening mammogram will be mailed directly to the patient. RECOMMENDATION: Screening mammogram in one year. (Code:[id number]) BI-RADS CATEGORY  1: Negative. Electronically Signed   By: Abelardo Diesel M.D.   On: 12/17/2018 10:41    Recent Labs: Lab Results  Component Value Date   WBC 4.9 10/24/2017   HGB 13.6 10/24/2017   PLT 285 10/24/2017   NA 138 11/20/2017   K 3.7 11/20/2017   CL 103 11/20/2017   CO2 29 11/20/2017   GLUCOSE 99 11/20/2017   BUN 13 11/20/2017   CREATININE 0.80 11/20/2017   BILITOT 0.2 11/20/2017   ALKPHOS 105 10/27/2016   AST 11 11/20/2017   ALT 9 11/20/2017   PROT 6.8 11/20/2017   ALBUMIN 4.0 10/27/2016   CALCIUM 9.2 11/20/2017   GFRAA 92 11/20/2017    Speciality Comments: No specialty comments available.  Procedures:  No procedures performed Allergies: Patient has no  known allergies.   Assessment / Plan:     Visit Diagnoses: Age-related osteoporosis without current pathological fracture: She presents today to discuss her recent DEXA results on 12/14/18: BMD measured at femur total right is 0.608 with T-score of -3.2.  She has been receiving IV reclast infusions.  Her most recent infusion was on 11/09/17.  She continues to take calcium and vitamin D.  She has not had any recent  falls or fractures. She was approved for Tymlos through her insurance. She will be switching from IV reclast to Tymlos sq daily injections.  Indications, contraindications, and potential side effects were discussed. All questions were addressed.  We will obtain the following lab work prior to her starting on Tymlos.  She will follow up in 6 months.  Plan: Parathyroid hormone, intact (no Ca), VITAMIN D 25 Hydroxy (Vit-D Deficiency, Fractures), Calcium, Phosphorus, TSH  Medication monitoring encounter - Tymlos -PTH, vitamin D, Calcium, phosphorus, and TSH were ordered today prior to starting on Tymlos.   Myalgia - She has no muscle aches or muscle tenderness at this time.   History of vitamin D deficiency - She is taking a vitamin D supplement.   Other medical conditions are listed as follows:  Monoclonal gammopathy   History of COPD   History of gastroesophageal reflux (GERD)  Coronary artery calcification   Former smoker   Orders: Orders Placed This Encounter  Procedures  . Parathyroid hormone, intact (no Ca)  . VITAMIN D 25 Hydroxy (Vit-D Deficiency, Fractures)  . Calcium  . Phosphorus  . TSH   No orders of the defined types were placed in this encounter.   Face-to-face time spent with patient was 30 minutes. Greater than 50% of time was spent in counseling and coordination of care.  Follow-Up Instructions: Return in about 6 months (around 07/10/2019) for Osteoporosis.   Ofilia Neas, PA-C  Note - This record has been created using Dragon software.  Chart creation  errors have been sought, but may not always  have been located. Such creation errors do not reflect on  the standard of medical care.

## 2018-12-31 ENCOUNTER — Ambulatory Visit: Payer: 59

## 2019-01-07 ENCOUNTER — Ambulatory Visit: Payer: 59 | Admitting: Physician Assistant

## 2019-01-07 ENCOUNTER — Telehealth: Payer: Self-pay | Admitting: Pharmacist

## 2019-01-07 ENCOUNTER — Other Ambulatory Visit: Payer: Self-pay

## 2019-01-07 ENCOUNTER — Encounter: Payer: Self-pay | Admitting: Physician Assistant

## 2019-01-07 VITALS — BP 96/59 | HR 63 | Resp 14 | Ht 65.0 in | Wt 100.0 lb

## 2019-01-07 DIAGNOSIS — Z8709 Personal history of other diseases of the respiratory system: Secondary | ICD-10-CM

## 2019-01-07 DIAGNOSIS — Z87891 Personal history of nicotine dependence: Secondary | ICD-10-CM

## 2019-01-07 DIAGNOSIS — D472 Monoclonal gammopathy: Secondary | ICD-10-CM | POA: Diagnosis not present

## 2019-01-07 DIAGNOSIS — I2584 Coronary atherosclerosis due to calcified coronary lesion: Secondary | ICD-10-CM

## 2019-01-07 DIAGNOSIS — Z5181 Encounter for therapeutic drug level monitoring: Secondary | ICD-10-CM | POA: Diagnosis not present

## 2019-01-07 DIAGNOSIS — M791 Myalgia, unspecified site: Secondary | ICD-10-CM | POA: Diagnosis not present

## 2019-01-07 DIAGNOSIS — Z8719 Personal history of other diseases of the digestive system: Secondary | ICD-10-CM | POA: Diagnosis not present

## 2019-01-07 DIAGNOSIS — Z8639 Personal history of other endocrine, nutritional and metabolic disease: Secondary | ICD-10-CM | POA: Diagnosis not present

## 2019-01-07 DIAGNOSIS — I2510 Atherosclerotic heart disease of native coronary artery without angina pectoris: Secondary | ICD-10-CM | POA: Diagnosis not present

## 2019-01-07 DIAGNOSIS — M810 Age-related osteoporosis without current pathological fracture: Secondary | ICD-10-CM

## 2019-01-07 NOTE — Telephone Encounter (Signed)
She is approved for Tymlos through her insurance.  Will send prescription pending lab results. Request Rx be sent to [facility].  She may schedule an appointment for additional injection technique training if needed.

## 2019-01-07 NOTE — Progress Notes (Signed)
Pharmacy Note   Subjective: Patient presents today to the [facility] to see Dr. Estanislado Pandy.  I was asked to see the patient regarding initiation of parathyroid hormone analog.  Patient's insurance was reviewed and it appears that Tymlos is the formulary preferred parathyroid hormone analog.  Patient seen by the pharmacist for counseling on Tymlos. DEXA on 12/14/2018 showed T score of -3.6 at spine and positive improvement in BMD.  Prior therapy includes:Reclast.   Objective: CMP     Component Value Date/Time   NA 138 11/20/2017 1548   K 3.7 11/20/2017 1548   CL 103 11/20/2017 1548   CO2 29 11/20/2017 1548   GLUCOSE 99 11/20/2017 1548   BUN 13 11/20/2017 1548   CREATININE 0.80 11/20/2017 1548   CALCIUM 9.2 11/20/2017 1548   PROT 6.8 11/20/2017 1548   ALBUMIN 4.0 10/27/2016 1554   AST 11 11/20/2017 1548   ALT 9 11/20/2017 1548   ALKPHOS 105 10/27/2016 1554   BILITOT 0.2 11/20/2017 1548   GFRNONAA 79 11/20/2017 1548   GFRAA 92 11/20/2017 1548    Baseline Lab Monitoring Lab Results  Component Value Date   VD25OH 39 10/24/2017   Lab Results  Component Value Date   PTH 38 07/04/2016   CALCIUM 9.2 11/20/2017    Lab Results  Component Value Date   TSH 0.72 05/02/2016     Assessment/Plan: Had detailed discussion of osteoporosis and goals of therapy.  Counseled patient on the purpose, proper use, and adverse effects of Tymlos.  Discussed maximum use of parathyroid hormone analog of two years and then patient would need to start another osteoporosis medication.  Patient voiced understanding.  Patient denies any questions or concerns at this time.  She is approved for Tymlos through her insurance.  She was given a co-pay card to present to the pharmacy.  Will send prescription pending lab results.  Demonstrated proper injection technique with Tymlos demo pen.  Patient able to demonstrate proper injection technique using the teach back method.  She may schedule an  appointment for additional injection technique training if needed.  She is to continue calcium/vitamin D supplement.  All questions encouraged and answered.  Instructed patient to call with any further questions or concerns.  Mariella Saa, PharmD, [place], [place] Clinical [facility] Pharmacist [phone]  01/07/2019 1:30 PM

## 2019-01-08 LAB — PARATHYROID HORMONE, INTACT (NO CA): PTH: 36 pg/mL (ref 14–64)

## 2019-01-08 LAB — PHOSPHORUS: Phosphorus: 4.1 mg/dL (ref 2.5–4.5)

## 2019-01-08 LAB — VITAMIN D 25 HYDROXY (VIT D DEFICIENCY, FRACTURES): Vit D, 25-Hydroxy: 34 ng/mL (ref 30–100)

## 2019-01-08 LAB — CALCIUM: Calcium: 10 mg/dL (ref 8.6–10.4)

## 2019-01-08 LAB — TSH: TSH: 0.86 mIU/L (ref 0.40–4.50)

## 2019-01-09 NOTE — Telephone Encounter (Signed)
Attempted to contact the patient and left message for patient to call the office.  

## 2019-01-10 MED ORDER — TYMLOS 3120 MCG/1.56ML ~~LOC~~ SOPN: 80.0000 ug | PEN_INJECTOR | Freq: Every day | SUBCUTANEOUS | 2 refills | Status: DC

## 2019-01-10 NOTE — Telephone Encounter (Signed)
Patient advised of lab results received. Patient declines technique training. Prescription sent to the pharmacy.

## 2019-01-11 MED FILL — TYMLOS 3120 MCG/1.56ML SOPN: 3120 | 30 days supply | Qty: 2 | Fill #0

## 2019-02-13 MED FILL — TYMLOS 3120 MCG/1.56ML SOPN: 3120 | 30 days supply | Qty: 2 | Fill #1

## 2019-03-07 ENCOUNTER — Ambulatory Visit (INDEPENDENT_AMBULATORY_CARE_PROVIDER_SITE_OTHER)
Admission: RE | Admit: 2019-03-07 | Discharge: 2019-03-07 | Disposition: A | Discharge status: Home / Self Care | Payer: 59 | Source: Ambulatory Visit | Attending: Acute Care | Admitting: Acute Care

## 2019-03-07 ENCOUNTER — Other Ambulatory Visit: Payer: Self-pay

## 2019-03-07 DIAGNOSIS — Z122 Encounter for screening for malignant neoplasm of respiratory organs: Secondary | ICD-10-CM

## 2019-03-07 DIAGNOSIS — F17210 Nicotine dependence, cigarettes, uncomplicated: Secondary | ICD-10-CM

## 2019-03-07 DIAGNOSIS — Z87891 Personal history of nicotine dependence: Secondary | ICD-10-CM | POA: Diagnosis not present

## 2019-03-18 MED FILL — TYMLOS 3120 MCG/1.56ML SOPN: 3120 | 30 days supply | Qty: 2 | Fill #2

## 2019-03-21 ENCOUNTER — Other Ambulatory Visit: Payer: Self-pay | Admitting: *Deleted

## 2019-03-21 DIAGNOSIS — Z87891 Personal history of nicotine dependence: Secondary | ICD-10-CM

## 2019-03-21 DIAGNOSIS — Z122 Encounter for screening for malignant neoplasm of respiratory organs: Secondary | ICD-10-CM

## 2019-04-03 ENCOUNTER — Ambulatory Visit: Payer: 59 | Admitting: Acute Care

## 2019-04-03 ENCOUNTER — Other Ambulatory Visit: Payer: Self-pay

## 2019-04-03 ENCOUNTER — Encounter: Payer: Self-pay | Admitting: Acute Care

## 2019-04-03 VITALS — BP 110/68 | HR 75 | Temp 97.3°F | Ht 64.5 in | Wt 110.2 lb

## 2019-04-03 DIAGNOSIS — I2721 Secondary pulmonary arterial hypertension: Secondary | ICD-10-CM

## 2019-04-03 DIAGNOSIS — R9389 Abnormal findings on diagnostic imaging of other specified body structures: Secondary | ICD-10-CM | POA: Insufficient documentation

## 2019-04-03 DIAGNOSIS — F17200 Nicotine dependence, unspecified, uncomplicated: Secondary | ICD-10-CM

## 2019-04-03 DIAGNOSIS — K766 Portal hypertension: Secondary | ICD-10-CM

## 2019-04-03 NOTE — Patient Instructions (Addendum)
It was great to see you today. Congratulations on quitting smoking!! We are proud of you.  Keep up the good work.  We will place an order for a 2 D Echo to evaluate your Pulmonary Artery Pressures. We will call you with results Continue Stialto as you have been doing Rinse mouth after use Continue Albuterol as needed for breakthrough wheezing or shortness of breath. Follow up with Dr. Halford Chessman in 1 year Please contact office for sooner follow up if symptoms do not improve or worsen or seek emergency care

## 2019-04-03 NOTE — Progress Notes (Signed)
History of Present Illness Robin Rivera is a 63 y.o. female current every day smoker with COPD and Emphysema. She is followed by Dr. Halford Chessman.  Current maintenance regimen: Stialto and prn albuterol  04/03/2019 Follow up for incidental finding on Low Dose CT 02/2019. Pt. Presents for follow up. She was last seen in the office 06/2016. At that time her COPD was at baseline. She has been followed by the lung cancer screening program since 2017. She had her annual low dose CT 02/2019 which was read as a Lung RADS 2: nodules that are benign in appearance and behavior with a very low likelihood of becoming a clinically active cancer due to size or lack of growth. Recommendation per radiology is for a repeat LDCT in 12 months.There was notation of increased caliber in her pulmonary artery which was suggestive of PAH. There is no documentation of echo in our records. She has never had an echo in the past. We discussed the role of an echocardiogram in evaluating her for [facility]. She is agreeable to have the echo done. She has been smoke free since 12/2018. She states she has had less dyspnea since quitting smoking. She has not had any flares of bronchitis in about a year. She is compliant with her Stialto and prn albuterol ( which she has rarely needed to use since quitting). She denies fever, chest pain, orthopnea or hemoptysis.   Test Results: Pulmonary tests Spirometry 01/09/09>>FEV1 2.20 (86%), FEV1% 70 Low dose CT chest 02/03/16 >> moderate centrilobular emphysema, scattered nodules PFT 02/09/16 >> FEV1 1.46 (53%), FEV1% 61, TLC 6.13 (114%), DLCO 63%>>Severe Obstructive Airways Disease with Mild Diffusion Defect    Imaging Low Dose CT for Lung Cancer Screening 02/2019 IMPRESSION: Lung-RADS 2, benign appearance or behavior. Continue annual screening with low-dose chest CT without contrast in 12 months. Aortic Atherosclerosis (ICD10-I70.0) and Emphysema (ICD10-J43.9). Increase caliber of the main  pulmonary artery suggesting PA Hypertension.  CBC Latest Ref Rng & Units 10/24/2017 03/30/2017 09/26/2016  WBC 3.8 - 10.8 Thousand/uL 4.9 5.9 5.8  Hemoglobin 11.7 - 15.5 g/dL 13.6 13.3 14.3  Hematocrit 35.0 - 45.0 % 40.5 40.4 43.0  Platelets 140 - 400 Thousand/uL 285 261 284    BMP Latest Ref Rng & Units 01/07/2019 11/20/2017 10/24/2017  Glucose 65 - 99 mg/dL - 99 102(H)  BUN 7 - 25 mg/dL - 13 12  Creatinine 0.50 - 0.99 mg/dL - 0.80 1.00(H)  BUN/Creat Ratio 6 - 22 (calc) - NOT APPLICABLE 12  Sodium A999333 - 146 mmol/L - 138 138  Potassium 3.5 - 5.3 mmol/L - 3.7 4.5  Chloride 98 - 110 mmol/L - 103 101  CO2 20 - 32 mmol/L - 29 32  Calcium 8.6 - 10.4 mg/dL 10.0 9.2 9.5    BNP No results found for: BNP  ProBNP No results found for: PROBNP  PFT    Component Value Date/Time   FEV1PRE 1.45 02/09/2016 1001   FEV1POST 1.46 02/09/2016 1001   FVCPRE 2.45 02/09/2016 1001   FVCPOST 2.39 02/09/2016 1001   TLC 6.13 02/09/2016 1001   DLCOUNC 17.05 02/09/2016 1001   PREFEV1FVCRT 59 02/09/2016 1001   PSTFEV1FVCRT 61 02/09/2016 1001    Ct Chest Lung Ca Screen Low Dose W/o Cm  Result Date: 03/08/2019 CLINICAL DATA:  Lung cancer screening. Thirty-seven pack year history. Former asymptomatic smoker. EXAM: CT CHEST WITHOUT CONTRAST LOW-DOSE FOR LUNG CANCER SCREENING TECHNIQUE: Multidetector CT imaging of the chest was performed following the standard protocol without IV contrast.  COMPARISON:  02/05/2018 FINDINGS: Cardiovascular: The heart size is normal. Increase caliber of the main pulmonary artery which measures 3.6 cm. Mild aortic atherosclerosis. Mediastinum/Nodes: No enlarged mediastinal, hilar, or axillary lymph nodes. Thyroid gland, trachea, and esophagus demonstrate no significant findings. Lungs/Pleura: No pleural effusion or pneumothorax. Moderate to advanced changes of centrilobular emphysema. Multiple small pulmonary nodules are identified scattered throughout both lungs. These do not appear  significantly changed in the interval. The largest nodule is in the posteromedial left lower lobe measuring 3.1 mm. Upper Abdomen: No acute abnormality. Left lobe of liver cyst is unchanged. Musculoskeletal: No aggressive lytic or sclerotic bone lesions. IMPRESSION: 1. Lung-RADS 2, benign appearance or behavior. Continue annual screening with low-dose chest CT without contrast in 12 months. 2. Aortic Atherosclerosis (ICD10-I70.0) and Emphysema (ICD10-J43.9). 3. Increase caliber of the main pulmonary artery suggesting PA hypertension. Electronically Signed   By: Kerby Moors M.D.   On: 03/08/2019 09:13     Past medical hx Past Medical History:  Diagnosis Date  . COPD (chronic obstructive pulmonary disease) ([place])   . Esophageal stricture   . GERD (gastroesophageal reflux disease)   . Monoclonal gammopathy    unknown significance, followed at [facility].   . Osteoporosis   . Rhinitis   . Tobacco abuse      Social History   Tobacco Use  . Smoking status: Former Smoker    Packs/day: 0.75    Years: 46.00    Pack years: 34.50    Types: Cigarettes    Quit date: 12/2018    Years since quitting: 0.2  . Smokeless tobacco: Never Used  Substance Use Topics  . Alcohol use: No    Alcohol/week: 0.0 standard drinks  . Drug use: No    Ms.Patricelli reports that she quit smoking about 3 months ago. Her smoking use included cigarettes. She has a 34.50 pack-year smoking history. She has never used smokeless tobacco. She reports that she does not drink alcohol or use drugs.  Tobacco Cessation: Former smoker ( Quit 12/2018)  with a 37  pack year smoking history  Past surgical hx, Family hx, Social hx all reviewed.  Current Outpatient Medications on File Prior to Visit  Medication Sig  . Abaloparatide (TYMLOS) 3120 MCG/1.56ML SOPN Inject 80 mcg into the skin daily.  Marland Kitchen albuterol (PROVENTIL HFA;VENTOLIN HFA) 108 (90 Base) MCG/ACT inhaler Inhale 2 puffs into the lungs every 6 (six)  hours as needed.  . Calcium Carbonate-Vitamin D3 (CALCIUM 600-D) 600-400 MG-UNIT TABS Take 2 tablets by mouth daily.  Marland Kitchen omeprazole (PRILOSEC) 40 MG capsule Take 1 capsule (40 mg total) by mouth daily.   Current Facility-Administered Medications on File Prior to Visit  Medication  . 0.9 %  sodium chloride infusion  . 0.9 %  sodium chloride infusion     No Known Allergies  Review Of Systems:  Constitutional:   No  weight loss, night sweats,  Fevers, chills, fatigue, or  lassitude.  HEENT:   No headaches,  Difficulty swallowing,  Tooth/dental problems, or  Sore throat,                No sneezing, itching, ear ache, nasal congestion, post nasal drip,   CV:  No chest pain,  Orthopnea, PND, swelling in lower extremities, anasarca, dizziness, palpitations, syncope.   GI  No heartburn, indigestion, abdominal pain, nausea, vomiting, diarrhea, change in bowel habits, loss of appetite, bloody stools.   Resp: No shortness of breath with exertion or at rest.  No excess mucus, no productive cough,  No non-productive cough,  No coughing up of blood.  No change in color of mucus.  No wheezing.  No chest wall deformity  Skin: no rash or lesions.  GU: no dysuria, change in color of urine, no urgency or frequency.  No flank pain, no hematuria   MS:  No joint pain or swelling.  No decreased range of motion.  No back pain.  Psych:  No change in mood or affect. No depression or anxiety.  No memory loss.   Vital Signs BP 110/68 (BP Location: Left Arm, Cuff Size: Normal)   Pulse 75   Temp (!) 97.3 F (36.3 C) (Oral)   Ht 5' 4.5" (1.638 m)   Wt 110 lb 3.2 oz (50 kg)   SpO2 96%   BMI 18.62 kg/m    Physical Exam:  General- No distress,  A&Ox3, pleasant ENT: No sinus tenderness, TM clear, pale nasal mucosa, no oral exudate,no post nasal drip, no LAN Cardiac: S1, S2, regular rate and rhythm, no murmur Chest: No wheeze/ rales/ dullness; no accessory muscle use, no nasal flaring, no sternal  retractions Abd.: Soft Non-tender, ND, BS + Ext: No clubbing cyanosis, edema Neuro:  normal strength Skin: No rashes, No lesions, warm and dry Psych: normal mood and behavior   Assessment/Plan  TOBACCO ABUSE Has been smoke free since 12/2018 Plan Continued support and encouragement Lung cancer screening due  02/2020  Abnormal CT of the chest CT chest with incidental finding of Increase caliber of the main pulmonary artery suggesting PA Hypertension. Plan We will order an echo We will call with results  This appointment was 30 min long with over 50% of the time in direct face-to-face patient care, assessment, plan of care, and follow-up.   Magdalen Spatz, NP 04/03/2019  4:33 PM

## 2019-04-03 NOTE — Assessment & Plan Note (Signed)
CT chest with incidental finding of Increase caliber of the main pulmonary artery suggesting PA Hypertension. Plan We will order an echo

## 2019-04-03 NOTE — Assessment & Plan Note (Signed)
Has been smoke free since 12/2018 Plan Continued support and encouragement Lung cancer screening due  02/2020

## 2019-04-04 MED ORDER — STIOLTO RESPIMAT 2.5-2.5 MCG/ACT IN AERS: 2.0000 | INHALATION_SPRAY | Freq: Every day | RESPIRATORY_TRACT | 0 refills | Status: DC

## 2019-04-04 NOTE — Progress Notes (Signed)
Reviewed and agree with assessment/plan.   Arlin Savona, MD [facility] 06/15/2016, 12:24 PM Pager:  [phone]  

## 2019-04-04 NOTE — Telephone Encounter (Signed)
Patient sent email this morning   "HI [name],     YOU ASKED ME YESTERDAY IF I WAS STILL TAKING STIALTO, AND I SAID YES. BUT, WHEN I LOOKED BACK I HAVE NEVER TAKEN THAT. I HAVE ONLY BEEN PRESCRIBED ALBUTEROL, WHICH IS FINE, BUT I THINK I NEED A RESCUE NEB AT TIMES. SO, IF YOU WANT TO PRESCRIBE STIALTO I WOULD BE MORE THAN HAPPY TO TRY IT. YOU CAN LET ME KNOW, THROUGH E-MAIL OR PHONE CALL. E-MAIL- [email or web address] OR [phone].                                                                                                                        [place],                                                                                                                       Robin Rivera"  [name] please advise.

## 2019-04-12 ENCOUNTER — Ambulatory Visit (HOSPITAL_COMMUNITY): Payer: 59 | Attending: Cardiology

## 2019-04-12 ENCOUNTER — Other Ambulatory Visit: Payer: Self-pay

## 2019-04-12 DIAGNOSIS — I2721 Secondary pulmonary arterial hypertension: Secondary | ICD-10-CM | POA: Insufficient documentation

## 2019-04-12 DIAGNOSIS — K766 Portal hypertension: Secondary | ICD-10-CM | POA: Insufficient documentation

## 2019-04-15 ENCOUNTER — Encounter: Payer: Self-pay | Admitting: Rheumatology

## 2019-04-15 DIAGNOSIS — Z5181 Encounter for therapeutic drug level monitoring: Secondary | ICD-10-CM

## 2019-04-15 NOTE — Telephone Encounter (Signed)
Last Visit: 01/07/19 Next Visit:07/11/19 Labs: 10/2017 WNL  Patient updated labs today 04/16/19   Okay to refill per Dr. Estanislado Pandy

## 2019-04-16 ENCOUNTER — Other Ambulatory Visit: Payer: Self-pay | Admitting: *Deleted

## 2019-04-16 DIAGNOSIS — Z5181 Encounter for therapeutic drug level monitoring: Secondary | ICD-10-CM

## 2019-04-16 MED ORDER — TYMLOS 3120 MCG/1.56ML ~~LOC~~ SOPN: 80.0000 ug | PEN_INJECTOR | Freq: Every day | SUBCUTANEOUS | 2 refills | Status: DC

## 2019-04-16 MED FILL — TYMLOS 3120 MCG/1.56ML SOPN: 3120 | 28 days supply | Qty: 2 | Fill #0

## 2019-04-17 ENCOUNTER — Encounter: Payer: Self-pay | Admitting: Rheumatology

## 2019-04-17 LAB — CBC WITH DIFFERENTIAL/PLATELET
Absolute Monocytes: 580 cells/uL (ref 200–950)
Basophils Absolute: 107 cells/uL (ref 0–200)
Basophils Relative: 1.7 %
Eosinophils Absolute: 359 cells/uL (ref 15–500)
Eosinophils Relative: 5.7 %
HCT: 38.9 % (ref 35.0–45.0)
Hemoglobin: 12.8 g/dL (ref 11.7–15.5)
Lymphs Abs: 1972 cells/uL (ref 850–3900)
MCH: 29.8 pg (ref 27.0–33.0)
MCHC: 32.9 g/dL (ref 32.0–36.0)
MCV: 90.5 fL (ref 80.0–100.0)
MPV: 9.8 fL (ref 7.5–12.5)
Monocytes Relative: 9.2 %
Neutro Abs: 3282 cells/uL (ref 1500–7800)
Neutrophils Relative %: 52.1 %
Platelets: 335 10*3/uL (ref 140–400)
RBC: 4.3 10*6/uL (ref 3.80–5.10)
RDW: 12.4 % (ref 11.0–15.0)
Total Lymphocyte: 31.3 %
WBC: 6.3 10*3/uL (ref 3.8–10.8)

## 2019-04-17 LAB — COMPLETE METABOLIC PANEL WITH GFR
AG Ratio: 1.5 (calc) (ref 1.0–2.5)
ALT: 10 U/L (ref 6–29)
AST: 15 U/L (ref 10–35)
Albumin: 4.1 g/dL (ref 3.6–5.1)
Alkaline phosphatase (APISO): 91 U/L (ref 37–153)
BUN: 16 mg/dL (ref 7–25)
CO2: 28 mmol/L (ref 20–32)
Calcium: 9.8 mg/dL (ref 8.6–10.4)
Chloride: 102 mmol/L (ref 98–110)
Creat: 0.97 mg/dL (ref 0.50–0.99)
GFR, Est African American: 72 mL/min/{1.73_m2} (ref >=60)
GFR, Est Non African American: 62 mL/min/{1.73_m2} (ref >=60)
Globulin: 2.7 g/dL (calc) (ref 1.9–3.7)
Glucose, Bld: 83 mg/dL (ref 65–99)
Potassium: 4.3 mmol/L (ref 3.5–5.3)
Sodium: 140 mmol/L (ref 135–146)
Total Bilirubin: 0.2 mg/dL (ref 0.2–1.2)
Total Protein: 6.8 g/dL (ref 6.1–8.1)

## 2019-05-14 MED FILL — TYMLOS 3120 MCG/1.56ML SOPN: 3120 | 30 days supply | Qty: 2 | Fill #1

## 2019-06-17 MED FILL — TYMLOS 3120 MCG/1.56ML SOPN: 3120 | 30 days supply | Qty: 2 | Fill #2

## 2019-07-09 ENCOUNTER — Telehealth: Payer: Self-pay | Admitting: Rheumatology

## 2019-07-09 MED ORDER — TYMLOS 3120 MCG/1.56ML ~~LOC~~ SOPN: 80.0000 ug | PEN_INJECTOR | Freq: Every day | SUBCUTANEOUS | 2 refills | Status: DC

## 2019-07-09 NOTE — Telephone Encounter (Signed)
Last Visit: 01/07/19 Next Visit: 09/17/19 Labs: 04/16/19 WNL  Okay to refill per Dr. Estanislado Pandy

## 2019-07-09 NOTE — Telephone Encounter (Signed)
Patient requested prescription refill of Tymlos to be sent to [facility].

## 2019-07-10 ENCOUNTER — Telehealth: Payer: Self-pay | Admitting: Pharmacy Technician

## 2019-07-10 NOTE — Telephone Encounter (Signed)
Submitted a Prior Authorization request to [facility] for TYMLOS via Cover My Meds. Will update once we receive a response.

## 2019-07-11 ENCOUNTER — Ambulatory Visit: Payer: 59 | Admitting: Rheumatology

## 2019-07-11 NOTE — Telephone Encounter (Signed)
Received notification from [facility] regarding a prior authorization for TYMLOS. Authorization has been APPROVED from 07/10/19 to 07/08/21.   Will send document to scan center.  Authorization # [id number] Phone # [phone]

## 2019-07-12 MED FILL — TYMLOS 3120 MCG/1.56ML SOPN: 3120 | 30 days supply | Qty: 2 | Fill #0

## 2019-08-06 NOTE — Progress Notes (Deleted)
64 y.o. G26P3003 Married White or Caucasian Not Hispanic or Latino female here for annual exam.      No LMP recorded. Patient has had a hysterectomy.          Sexually active: {yes no:314532}  The current method of family planning is {contraception:315051}.    Exercising: {yes no:314532}  {types:19826} Smoker:  {YES NO:22349}  Health Maintenance: Pap:  Unsure Hysterectomy  History of abnormal Pap:  no MMG:  12/17/18 Bi-rads 1 neg Density d  BMD:   12/14/18 Osteoporotic T-score -3.2 Colonoscopy2-8-18 polyps- repeat in 3 yrs: *** TDaP:  05/02/16     reports that she quit smoking about 7 months ago. Her smoking use included cigarettes. She has a 34.50 pack-year smoking history. She has never used smokeless tobacco. She reports that she does not drink alcohol or use drugs.  Past Medical History:  Diagnosis Date  . COPD (chronic obstructive pulmonary disease) ([place])   . Esophageal stricture   . GERD (gastroesophageal reflux disease)   . Monoclonal gammopathy    unknown significance, followed at [facility].   . Osteoporosis   . Rhinitis   . Tobacco abuse     Past Surgical History:  Procedure Laterality Date  . BREAST BIOPSY    . BREAST EXCISIONAL BIOPSY Right   . BREAST SURGERY Bilateral    lumpectomy   . CESAREAN SECTION    . TONSILLECTOMY    . TOTAL ABDOMINAL HYSTERECTOMY     Partial hysterectomy    Current Outpatient Medications  Medication Sig Dispense Refill  . Abaloparatide (TYMLOS) 3120 MCG/1.56ML SOPN Inject 80 mcg into the skin daily. 1 pen 2  . albuterol (PROVENTIL HFA;VENTOLIN HFA) 108 (90 Base) MCG/ACT inhaler Inhale 2 puffs into the lungs every 6 (six) hours as needed. 1 Inhaler 5  . Calcium Carbonate-Vitamin D3 (CALCIUM 600-D) 600-400 MG-UNIT TABS Take 2 tablets by mouth daily.    Marland Kitchen omeprazole (PRILOSEC) 40 MG capsule Take 1 capsule (40 mg total) by mouth daily. 90 capsule 3  . Tiotropium Bromide-Olodaterol (STIOLTO RESPIMAT) 2.5-2.5 MCG/ACT  AERS Inhale 2 puffs into the lungs daily. 8 g 0   Current Facility-Administered Medications  Medication Dose Route Frequency Provider Last Rate Last Admin  . 0.9 %  sodium chloride infusion  500 mL Intravenous Continuous Irene Shipper, MD      . 0.9 %  sodium chloride infusion  500 mL Intravenous Continuous Irene Shipper, MD        Family History  Adopted: Yes  Problem Relation Age of Onset  . COPD Mother   . Kidney disease Mother   . Colon cancer Maternal Aunt   . Healthy Son   . Healthy Son   . Healthy Son     Review of Systems  Exam:   There were no vitals taken for this visit.  Weight change: @WEIGHTCHANGE @ Height:      Ht Readings from Last 3 Encounters:  04/03/19 5' 4.5" (1.638 m)  01/07/19 5\' 5"  (1.651 m)  11/13/18 5' 4.5" (1.638 m)    General appearance: alert, cooperative and appears stated age Head: Normocephalic, without obvious abnormality, atraumatic Neck: no adenopathy, supple, symmetrical, trachea midline and thyroid {CHL AMB PHY EX THYROID NORM [phone]::"normal to inspection and palpation"} Lungs: clear to auscultation bilaterally Cardiovascular: regular rate and rhythm Breasts: {Exam; breast:13139::"normal appearance, no masses or tenderness"} Abdomen: soft, non-tender; non distended,  no masses,  no organomegaly Extremities: extremities normal, atraumatic, no cyanosis or edema Skin: Skin  color, texture, turgor normal. No rashes or lesions Lymph nodes: Cervical, supraclavicular, and axillary nodes normal. No abnormal inguinal nodes palpated Neurologic: Grossly normal   Pelvic: External genitalia:  no lesions              Urethra:  normal appearing urethra with no masses, tenderness or lesions              Bartholins and Skenes: normal                 Vagina: normal appearing vagina with normal color and discharge, no lesions              Cervix: {CHL AMB PHY EX CERVIX NORM DEFAULT:[phone]::"no lesions"}               Bimanual Exam:   Uterus:  {CHL AMB PHY EX UTERUS NORM DEFAULT:[phone]::"normal size, contour, position, consistency, mobility, non-tender"}              Adnexa: {CHL AMB PHY EX ADNEXA NO MASS [phone]::"no mass, fullness, tenderness"}               Rectovaginal: Confirms               Anus:  normal sphincter tone, no lesions  *** chaperoned for the exam.  A:  Well Woman with normal exam  P:

## 2019-08-08 ENCOUNTER — Telehealth: Payer: Self-pay

## 2019-08-08 ENCOUNTER — Ambulatory Visit: Payer: 59 | Admitting: Obstetrics and Gynecology

## 2019-08-08 NOTE — Telephone Encounter (Signed)
Left message for patient that appointment with Dr.Jertson had to be cancelled today due to the office being closed as a results of inclement weather. Requested patient return call to the office tomorrow to reschedule appointment. 

## 2019-08-13 NOTE — Telephone Encounter (Signed)
Left message to call Robin Rivera at [phone]. 

## 2019-08-15 MED FILL — TYMLOS 3120 MCG/1.56ML SOPN: 3120 | 30 days supply | Qty: 2 | Fill #1

## 2019-08-30 ENCOUNTER — Other Ambulatory Visit: Payer: Self-pay | Admitting: Nurse Practitioner

## 2019-08-30 ENCOUNTER — Encounter: Payer: Self-pay | Admitting: Nurse Practitioner

## 2019-08-30 ENCOUNTER — Ambulatory Visit (INDEPENDENT_AMBULATORY_CARE_PROVIDER_SITE_OTHER): Payer: 59 | Admitting: Nurse Practitioner

## 2019-08-30 ENCOUNTER — Other Ambulatory Visit: Payer: Self-pay

## 2019-08-30 VITALS — BP 98/60 | HR 82 | Temp 98.6°F | Ht 64.0 in | Wt 125.0 lb

## 2019-08-30 DIAGNOSIS — R1310 Dysphagia, unspecified: Secondary | ICD-10-CM

## 2019-08-30 DIAGNOSIS — K219 Gastro-esophageal reflux disease without esophagitis: Secondary | ICD-10-CM | POA: Diagnosis not present

## 2019-08-30 DIAGNOSIS — Z86010 Personal history of colonic polyps: Secondary | ICD-10-CM | POA: Diagnosis not present

## 2019-08-30 DIAGNOSIS — Z01818 Encounter for other preprocedural examination: Secondary | ICD-10-CM

## 2019-08-30 MED ORDER — OMEPRAZOLE 40 MG PO CPDR: 40.0000 mg | DELAYED_RELEASE_CAPSULE | ORAL | 4 refills | Status: DC

## 2019-08-30 MED ORDER — NA SULFATE-K SULFATE-MG SULF 17.5-3.13-1.6 GM/177ML PO SOLN: ORAL | 0 refills | Status: DC

## 2019-08-30 MED FILL — SUPREP BOWEL PREP KIT: 17.5-3.13-1 | 2 days supply | Qty: 354 | Fill #0

## 2019-08-30 MED FILL — OMEPRAZOLE 40 MG CPDR: 40 | 90 days supply | Qty: 90 | Fill #0

## 2019-08-30 NOTE — Progress Notes (Signed)
IMPRESSION and PLAN:    Meca Brusca is a 64 y.o. female with a pmh significant for, not necessarily limited to colon polyps, COPD     # History of tubulovillous adenomatous colon polyps --Due for 3-year surveillance colonoscopy. The risks and benefits of colonoscopy with possible polypectomy / biopsies were discussed and the patient agrees to proceed.  # GERD.  --Heartburn and regurgitation on Prilosec OTC 40 mg daily Did not have refill on Prilosec, has been taking OTC Prilosec for 2 years.  Sometimes takes vinegar to help with the burning --Refill Prilosec 40 mg every morning,  #90 and a year's refill  # Recurrent dysphagia.  -- Hx of benign esophageal stricture status post Mal[place] dilation 2017.  Did well from a swallowing standpoint until 1 year ago.  Now with recurrent solid food dysphagia, occasional problems swallowing liquids as well --EGD with probable dilation at time of colonoscopy. The risks and benefits of EGD were discussed and the patient agrees to proceed.   # COPD, not on 02 -She stopped smoking 8 months ago.   HPI:    Primary GI: Dr. 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  She has a history of colon polyps followed by Dr. Henrene Pastor.  It is time for her 3-year surveillance colonoscopy.  Jenny Reichmann also has a history of GERD and esophageal strictures.  Her PCP retired and she had no refills on Prilosec.  She has been taking OTC Prilosec 40 mg daily but having frequent heartburn and regurgitation.  Esophagus was dilated in 2017, she did well until about a year ago.  Now having recurrent solid food dysphagia.  Sometimes liquids feel like they get stuck as well.  She has no associated weight loss but quit smoking 8 months ago and gained weight.  She has no general medical complaints.  She feels a lot better after smoking  cessation   Endoscopic History:  Colonoscopy February 2018.  4 small polyps removed, mild diverticulosis.  Pathology -tubular adenoma and tubulovillous adenoma  EGD 2017 for evaluation of dysphagia.  Findings included a mild benign-appearing intrinsic stenosis measuring 1.5 cm.  Dilation performed with [facility] dilator.   Review of systems:     No chest pain, no SOB, no fevers, no urinary sx   Past Medical History:  Diagnosis Date  . COPD (chronic obstructive pulmonary disease) ([place])   . Esophageal stricture   . GERD (gastroesophageal reflux disease)   . Monoclonal gammopathy    unknown significance, followed at [facility].   . Osteoporosis   . Rhinitis   . Tobacco abuse    Past Surgical History:  Procedure Laterality Date  . BREAST BIOPSY    . BREAST EXCISIONAL BIOPSY Right   . BREAST SURGERY Bilateral    lumpectomy   . CESAREAN SECTION    . COLONOSCOPY    . ESOPHAGOGASTRODUODENOSCOPY    . TONSILLECTOMY    . TOTAL ABDOMINAL HYSTERECTOMY     Partial hysterectomy    Family History  Adopted: Yes  Problem Relation Age of Onset  . COPD Mother   . Kidney disease Mother   . Other Father        his hx is unknown  . Colon cancer Maternal Aunt   . Healthy Son   . Healthy Son   . Healthy  Son     Creatinine clearance cannot be calculated (Patient's most recent lab result is older than the maximum 21 days allowed.)  Current Outpatient Medications  Medication Sig Dispense Refill  . Abaloparatide (TYMLOS) 3120 MCG/1.56ML SOPN Inject 80 mcg into the skin daily. 1 pen 2  . albuterol (PROVENTIL HFA;VENTOLIN HFA) 108 (90 Base) MCG/ACT inhaler Inhale 2 puffs into the lungs every 6 (six) hours as needed. 1 Inhaler 5  . Calcium Carbonate-Vitamin D3 (CALCIUM 600-D) 600-400 MG-UNIT TABS Take 2 tablets by mouth daily.    Marland Kitchen omeprazole (PRILOSEC) 40 MG capsule Take 1 capsule (40 mg total) by mouth daily. 90 capsule 3  . Tiotropium Bromide-Olodaterol (STIOLTO RESPIMAT)  2.5-2.5 MCG/ACT AERS Inhale 2 puffs into the lungs daily. 8 g 0   No current facility-administered medications for this visit.    Physical Exam:     BP 98/60   Pulse 82   Temp 98.6 F (37 C)   Ht 5\' 4"  (1.626 m)   Wt 125 lb (56.7 kg)   BMI 21.46 kg/m   GENERAL:  Pleasant female in NAD PSYCH: : Cooperative, normal affect CARDIAC:  RRR,  no peripheral edema PULM: Normal respiratory effort, lungs CTA bilaterally, no wheezing ABDOMEN:  Nondistended, soft, nontender. No obvious masses, no hepatomegaly,  normal bowel sounds SKIN:  turgor, no lesions seen Musculoskeletal:  Normal muscle tone, normal strength NEURO: Alert and oriented x 3, no focal neurologic deficits   Tye Savoy , NP 08/30/2019, 3:54 PM

## 2019-08-30 NOTE — Telephone Encounter (Signed)
Unable to reach patient regarding rescheduling her annual exam. Letter to Dr.Jertson for review.

## 2019-08-30 NOTE — Patient Instructions (Addendum)
If you are age 64 or older, your body mass index should be between 23-30. Your Body mass index is 21.46 kg/m. If this is out of the aforementioned range listed, please consider follow up with your Primary Care Provider.  If you are age 86 or younger, your body mass index should be between 19-25. Your Body mass index is 21.46 kg/m. If this is out of the aformentioned range listed, please consider follow up with your Primary Care Provider.   You have been scheduled for an endoscopy and colonoscopy. Please follow the written instructions given to you at your visit today. Please pick up your prep supplies at the pharmacy within the next 1-3 days. If you use inhalers (even only as needed), please bring them with you on the day of your procedure. Your physician has requested that you go to www.startemmi.com and enter the access code given to you at your visit today. This web site gives a general overview about your procedure. However, you should still follow specific instructions given to you by our office regarding your preparation for the procedure.  We have sent the following medications to your pharmacy for you to pick up at your convenience: Suprep Omeprazole 40 mg   Thank you for choosing me and [facility].   Tye Savoy, NP

## 2019-09-04 NOTE — Telephone Encounter (Signed)
Letter mailed to home address. Encounter closed.

## 2019-09-09 NOTE — Progress Notes (Signed)
Office Visit Note  Patient: Robin Rivera             Date of Birth: November 26, 1955           MRN: [id number]             PCP: Patient, No Pcp Per Referring: No ref. provider found Visit Date: 09/17/2019 Occupation: @GUAROCC @  Subjective:  Medication monitoring   History of Present Illness: Alejandro Mccallum is a 64 y.o. female with history of osteoporosis and vitamin D deficiency.  She is on Tymlos sq daily injections.  She is taking a calcium and vitamin D supplement. She denies any recent falls or fractures.  She states she quit smoking about 9 months ago.  She denies any joint pain or joint swelling.  She denies any morning stiffness.    Activities of Daily Living:  Patient reports morning stiffness for 0 minutes.   Patient Denies nocturnal pain.  Difficulty dressing/grooming: Denies Difficulty climbing stairs: Denies Difficulty getting out of chair: Denies Difficulty using hands for taps, buttons, cutlery, and/or writing: Denies  Review of Systems  Constitutional: Negative for fatigue.  HENT: Negative for mouth sores, mouth dryness and nose dryness.   Eyes: Negative for pain, itching, visual disturbance and dryness.  Respiratory: Negative for cough, hemoptysis, shortness of breath, wheezing and difficulty breathing.   Cardiovascular: Negative for chest pain, palpitations, hypertension and swelling in legs/feet.  Gastrointestinal: Negative for blood in stool, constipation and diarrhea.  Endocrine: Negative for increased urination.  Genitourinary: Negative for difficulty urinating and painful urination.  Musculoskeletal: Negative for arthralgias, joint pain, joint swelling, myalgias, muscle weakness, morning stiffness, muscle tenderness and myalgias.  Skin: Negative for color change, pallor, rash, hair loss, nodules/bumps, redness, skin tightness, ulcers and sensitivity to sunlight.  Allergic/Immunologic: Negative for susceptible to infections.  Neurological: Negative for  dizziness, numbness, headaches, memory loss and weakness.  Hematological: Negative for swollen glands.  Psychiatric/Behavioral: Negative for depressed mood, confusion and sleep disturbance. The patient is not nervous/anxious.     PMFS History:  Patient Active Problem List   Diagnosis Date Noted  . Abnormal CT of the chest 04/03/2019  . Monoclonal gammopathy 10/22/2016  . Age-related osteoporosis without current pathological fracture 09/22/2016  . Vitamin D deficiency 09/22/2016  . Routine general medical examination at a health care facility 05/02/2016  . Underweight 05/02/2016  . GERD (gastroesophageal reflux disease) 03/29/2016  . Eye irritation 02/15/2016  . Dysphagia 02/04/2016  . Atypical chest pain 02/04/2016  . Coronary artery calcification seen on CAT scan 02/04/2016  . Chronic obstructive pulmonary disease ([place]) 08/16/2013  . TOBACCO ABUSE 01/09/2009    Past Medical History:  Diagnosis Date  . COPD (chronic obstructive pulmonary disease) ([place])   . Esophageal stricture   . GERD (gastroesophageal reflux disease)   . Monoclonal gammopathy    unknown significance, followed at [facility].   . Osteoporosis   . Rhinitis   . Tobacco abuse     Family History  Adopted: Yes  Problem Relation Age of Onset  . COPD Mother   . Kidney disease Mother   . Other Father        his hx is unknown  . Colon cancer Maternal Aunt   . Healthy Son   . Healthy Son   . Healthy Son    Past Surgical History:  Procedure Laterality Date  . BREAST BIOPSY    . BREAST EXCISIONAL BIOPSY Right   . BREAST SURGERY Bilateral  lumpectomy   . CESAREAN SECTION    . COLONOSCOPY    . ESOPHAGOGASTRODUODENOSCOPY    . TONSILLECTOMY    . TOTAL ABDOMINAL HYSTERECTOMY     Partial hysterectomy   Social History   Social History Narrative   Fun: Play games on her computer, bon fires   Denies abuse and feels safe at home.    Immunization History  Administered Date(s)  Administered  . Influenza Split 04/12/2011, 04/15/2013, 03/25/2014, 03/22/2016  . Influenza,inj,Quad PF,6+ Mos 03/21/2015  . Pneumococcal Polysaccharide-23 02/15/2016  . Tdap 05/02/2016     Objective: Vital Signs: BP 117/74 (BP Location: Right Arm, Patient Position: Sitting, Cuff Size: Normal)   Pulse 73   Resp 14   Ht 5' 4.5" (1.638 m)   Wt 131 lb 3.2 oz (59.5 kg)   BMI 22.17 kg/m    Physical Exam Vitals and nursing note reviewed.  Constitutional:      Appearance: She is well-developed.  HENT:     Head: Normocephalic and atraumatic.  Eyes:     Conjunctiva/sclera: Conjunctivae normal.  Pulmonary:     Effort: Pulmonary effort is normal.  Abdominal:     General: Bowel sounds are normal.     Palpations: Abdomen is soft.  Musculoskeletal:     Cervical back: Normal range of motion.  Skin:    General: Skin is warm and dry.     Capillary Refill: Capillary refill takes less than 2 seconds.  Neurological:     Mental Status: She is alert and oriented to person, place, and time.  Psychiatric:        Behavior: Behavior normal.      Musculoskeletal Exam: C-spine, thoracic spine, and lumbar spine good ROM.  No midline spinal tenderness.  No SI joint tenderness.  Shoulder joints, elbow joints, wrist joints, MCPs, PIPs, and DIPs good ROM with no synovitis.  Complete fist formation bilaterally.  Hip joints, knee joints, ankle joints, MTPs, PIPs, and DIPs good ROM with no synovitis.  No warmth or effusion of knee joints.  No tenderness or swelling of ankle joints.    CDAI Exam: CDAI Score: -- Patient Global: --; Provider Global: -- Swollen: --; Tender: -- Joint Exam 09/17/2019   No joint exam has been documented for this visit   There is currently no information documented on the homunculus. Go to the Rheumatology activity and complete the homunculus joint exam.  Investigation: No additional findings.  Imaging: No results found.  Recent Labs: Lab Results  Component Value  Date   WBC 6.3 04/16/2019   HGB 12.8 04/16/2019   PLT 335 04/16/2019   NA 140 04/16/2019   K 4.3 04/16/2019   CL 102 04/16/2019   CO2 28 04/16/2019   GLUCOSE 83 04/16/2019   BUN 16 04/16/2019   CREATININE 0.97 04/16/2019   BILITOT 0.2 04/16/2019   ALKPHOS 105 10/27/2016   AST 15 04/16/2019   ALT 10 04/16/2019   PROT 6.8 04/16/2019   ALBUMIN 4.0 10/27/2016   CALCIUM 9.8 04/16/2019   GFRAA 72 04/16/2019    Speciality Comments: No specialty comments available.  Procedures:  No procedures performed Allergies: Patient has no known allergies.   Assessment / Plan:     Visit Diagnoses: Age-related osteoporosis without current pathological fracture - DEXA results on 12/14/18: BMD measured at femur total right is 0.608 with T-score of -3.2.  She was previously on Reclast IV infusions.  Her last reclast infusion was on 11/09/17.  She was started on Tymlos in July  2020 after discussing DEXA results from 12/14/18.  She has been tolerating Tymlos without any side effects or injection site reactions.  She continues to take a calcium and vitamin D supplement daily.  Vitamin D was 34 on 01/07/19.  We will recheck vitamin D level today.  She has not had any recent falls or fractures.  No thoracic kyphosis noted.  No midline spinal tenderness.  She has not noticed any height loss.  She will continue on Tymlos sq daily injections, calcium, and vitamin D supplement. CBC and CMP will be drawn today. She will follow up in 6 months.   - Plan: VITAMIN D 25 Hydroxy (Vit-D Deficiency, Fractures)  Medication monitoring encounter - Tymlos sq daily injections.  She was previously on IV reclast.  CBC and CMP were drawn today to monitor for drug toxicity.  - Plan: CBC with Differential/Platelet, COMPLETE METABOLIC PANEL WITH GFR  Myalgia: She has not had any recent myalgias or muscle tenderness.  No muscle weakness .   History of vitamin D deficiency - She is taking vitamin D 2,000 units daily.  Vitamin D was 34 on  01/07/19. We will recheck vitamin D level today. Plan: VITAMIN D 25 Hydroxy (Vit-D Deficiency, Fractures)  Other medical conditions are listed as follows:   Monoclonal gammopathy  Coronary artery calcification  History of gastroesophageal reflux (GERD)  History of COPD  Former smoker  Orders: Orders Placed This Encounter  Procedures  . CBC with Differential/Platelet  . COMPLETE METABOLIC PANEL WITH GFR  . VITAMIN D 25 Hydroxy (Vit-D Deficiency, Fractures)   No orders of the defined types were placed in this encounter.     Follow-Up Instructions: Return in about 6 months (around 03/19/2020) for Osteoporosis.   Ofilia Neas, PA-C  Note - This record has been created using Dragon software.  Chart creation errors have been sought, but may not always  have been located. Such creation errors do not reflect on  the standard of medical care.

## 2019-09-13 MED FILL — TYMLOS 3120 MCG/1.56ML SOPN: 3120 | 30 days supply | Qty: 2 | Fill #2

## 2019-09-17 ENCOUNTER — Other Ambulatory Visit: Payer: Self-pay

## 2019-09-17 ENCOUNTER — Encounter: Payer: Self-pay | Admitting: Physician Assistant

## 2019-09-17 ENCOUNTER — Ambulatory Visit: Payer: 59 | Admitting: Physician Assistant

## 2019-09-17 VITALS — BP 117/74 | HR 73 | Resp 14 | Ht 64.5 in | Wt 131.2 lb

## 2019-09-17 DIAGNOSIS — Z8709 Personal history of other diseases of the respiratory system: Secondary | ICD-10-CM | POA: Diagnosis not present

## 2019-09-17 DIAGNOSIS — D472 Monoclonal gammopathy: Secondary | ICD-10-CM

## 2019-09-17 DIAGNOSIS — Z8639 Personal history of other endocrine, nutritional and metabolic disease: Secondary | ICD-10-CM

## 2019-09-17 DIAGNOSIS — M810 Age-related osteoporosis without current pathological fracture: Secondary | ICD-10-CM | POA: Diagnosis not present

## 2019-09-17 DIAGNOSIS — Z87891 Personal history of nicotine dependence: Secondary | ICD-10-CM

## 2019-09-17 DIAGNOSIS — M791 Myalgia, unspecified site: Secondary | ICD-10-CM | POA: Diagnosis not present

## 2019-09-17 DIAGNOSIS — I2510 Atherosclerotic heart disease of native coronary artery without angina pectoris: Secondary | ICD-10-CM | POA: Diagnosis not present

## 2019-09-17 DIAGNOSIS — Z5181 Encounter for therapeutic drug level monitoring: Secondary | ICD-10-CM | POA: Diagnosis not present

## 2019-09-17 DIAGNOSIS — I2584 Coronary atherosclerosis due to calcified coronary lesion: Secondary | ICD-10-CM

## 2019-09-17 DIAGNOSIS — Z8719 Personal history of other diseases of the digestive system: Secondary | ICD-10-CM

## 2019-09-18 LAB — CBC WITH DIFFERENTIAL/PLATELET
Absolute Monocytes: 391 cells/uL (ref 200–950)
Basophils Absolute: 99 cells/uL (ref 0–200)
Basophils Relative: 1.8 %
Eosinophils Absolute: 231 cells/uL (ref 15–500)
Eosinophils Relative: 4.2 %
HCT: 40.3 % (ref 35.0–45.0)
Hemoglobin: 13 g/dL (ref 11.7–15.5)
Lymphs Abs: 1551 cells/uL (ref 850–3900)
MCH: 29.3 pg (ref 27.0–33.0)
MCHC: 32.3 g/dL (ref 32.0–36.0)
MCV: 90.8 fL (ref 80.0–100.0)
MPV: 9.8 fL (ref 7.5–12.5)
Monocytes Relative: 7.1 %
Neutro Abs: 3229 cells/uL (ref 1500–7800)
Neutrophils Relative %: 58.7 %
Platelets: 322 10*3/uL (ref 140–400)
RBC: 4.44 10*6/uL (ref 3.80–5.10)
RDW: 13.1 % (ref 11.0–15.0)
Total Lymphocyte: 28.2 %
WBC: 5.5 10*3/uL (ref 3.8–10.8)

## 2019-09-18 LAB — COMPLETE METABOLIC PANEL WITH GFR
AG Ratio: 1.4 (calc) (ref 1.0–2.5)
ALT: 29 U/L (ref 6–29)
AST: 21 U/L (ref 10–35)
Albumin: 4.2 g/dL (ref 3.6–5.1)
Alkaline phosphatase (APISO): 104 U/L (ref 37–153)
BUN/Creatinine Ratio: 13 (calc) (ref 6–22)
BUN: 13 mg/dL (ref 7–25)
CO2: 31 mmol/L (ref 20–32)
Calcium: 9.9 mg/dL (ref 8.6–10.4)
Chloride: 103 mmol/L (ref 98–110)
Creat: 1.03 mg/dL — ABNORMAL HIGH (ref 0.50–0.99)
GFR, Est African American: 67 mL/min/{1.73_m2} (ref >=60)
GFR, Est Non African American: 58 mL/min/{1.73_m2} — ABNORMAL LOW (ref >=60)
Globulin: 3 g/dL (calc) (ref 1.9–3.7)
Glucose, Bld: 105 mg/dL — ABNORMAL HIGH (ref 65–99)
Potassium: 4.3 mmol/L (ref 3.5–5.3)
Sodium: 141 mmol/L (ref 135–146)
Total Bilirubin: 0.3 mg/dL (ref 0.2–1.2)
Total Protein: 7.2 g/dL (ref 6.1–8.1)

## 2019-09-18 LAB — VITAMIN D 25 HYDROXY (VIT D DEFICIENCY, FRACTURES): Vit D, 25-Hydroxy: 14 ng/mL — ABNORMAL LOW (ref 30–100)

## 2019-09-18 MED ORDER — VITAMIN D (ERGOCALCIFEROL) 1.25 MG (50000 UNIT) PO CAPS: 50000.0000 [IU] | ORAL_CAPSULE | ORAL | 0 refills | Status: DC

## 2019-09-18 MED FILL — VIT D2 1.25 MG (50,000 UNIT: 1.25 MG | 84 days supply | Qty: 24 | Fill #0

## 2019-09-18 NOTE — Progress Notes (Signed)
Vitamin D is very low-14.  Please notify patient and send in vitamin D 50,000 units by mouth twice weekly for 3 months.  We will recheck vitamin D in 3 months.    CBC WNL.  Creatinine is borderline elevated-1.03.  GFR is borderline low-58.  Please advise patient to stay hydrated and to avoid NSAIDs.

## 2019-09-18 NOTE — Telephone Encounter (Signed)
Patient returned call to the office. Patient states she would like Vitamin D sent to [facility].

## 2019-09-25 ENCOUNTER — Encounter: Payer: 59 | Admitting: Internal Medicine

## 2019-10-09 ENCOUNTER — Ambulatory Visit (INDEPENDENT_AMBULATORY_CARE_PROVIDER_SITE_OTHER): Payer: 59

## 2019-10-09 ENCOUNTER — Other Ambulatory Visit: Payer: Self-pay | Admitting: Internal Medicine

## 2019-10-09 ENCOUNTER — Encounter: Payer: Self-pay | Admitting: Internal Medicine

## 2019-10-09 DIAGNOSIS — Z1159 Encounter for screening for other viral diseases: Secondary | ICD-10-CM

## 2019-10-10 LAB — SARS CORONAVIRUS 2 (TAT 6-24 HRS): SARS Coronavirus 2: NEGATIVE

## 2019-10-11 ENCOUNTER — Encounter: Payer: Self-pay | Admitting: Internal Medicine

## 2019-10-11 ENCOUNTER — Ambulatory Visit (AMBULATORY_SURGERY_CENTER): Payer: 59 | Admitting: Internal Medicine

## 2019-10-11 ENCOUNTER — Other Ambulatory Visit: Payer: Self-pay

## 2019-10-11 VITALS — BP 122/77 | HR 68 | Temp 97.5°F | Resp 14 | Ht 64.0 in | Wt 125.0 lb

## 2019-10-11 DIAGNOSIS — D125 Benign neoplasm of sigmoid colon: Secondary | ICD-10-CM | POA: Diagnosis not present

## 2019-10-11 DIAGNOSIS — R1319 Other dysphagia: Secondary | ICD-10-CM

## 2019-10-11 DIAGNOSIS — Z86010 Personal history of colonic polyps: Secondary | ICD-10-CM

## 2019-10-11 DIAGNOSIS — R1310 Dysphagia, unspecified: Secondary | ICD-10-CM | POA: Diagnosis not present

## 2019-10-11 DIAGNOSIS — K222 Esophageal obstruction: Secondary | ICD-10-CM

## 2019-10-11 DIAGNOSIS — Z1211 Encounter for screening for malignant neoplasm of colon: Secondary | ICD-10-CM | POA: Diagnosis not present

## 2019-10-11 MED ORDER — SODIUM CHLORIDE 0.9 % IV SOLN: 500.0000 mL | Freq: Once | INTRAVENOUS | Status: DC

## 2019-10-11 NOTE — Progress Notes (Signed)
pt tolerated well. VSS. awake and to recovery. Report given to RN. Bite block inserted and removed with ease. atraumatic

## 2019-10-11 NOTE — Op Note (Signed)
[place] Patient Name: Niyomi Park Procedure Date: 10/11/2019 2:31 PM MRN: [id number] Endoscopist: Docia Chuck. Henrene Pastor , MD Age: 64 Referring MD:  Date of Birth: 1955-10-25 Gender: Female Account #: [id number] Procedure:                Colonoscopy with cold snare polypectomy x 1 Indications:              High risk colon cancer surveillance: Personal                            history of adenoma with villous component, High                            risk colon cancer surveillance: Personal history of                            multiple (3 or more) adenomas. Previous examination                            February 2018 Medicines:                Monitored Anesthesia Care Procedure:                Pre-Anesthesia Assessment:                           - Prior to the procedure, a History and Physical                            was performed, and patient medications and                            allergies were reviewed. The patient's tolerance of                            previous anesthesia was also reviewed. The risks                            and benefits of the procedure and the sedation                            options and risks were discussed with the patient.                            All questions were answered, and informed consent                            was obtained. Prior Anticoagulants: The patient has                            taken no previous anticoagulant or antiplatelet                            agents. ASA Grade Assessment: II - A patient with  mild systemic disease. After reviewing the risks                            and benefits, the patient was deemed in                            satisfactory condition to undergo the procedure.                           After obtaining informed consent, the colonoscope                            was passed under direct vision. Throughout the                            procedure, the  patient's blood pressure, pulse, and                            oxygen saturations were monitored continuously. The                            Colonoscope was introduced through the anus and                            advanced to the the cecum, identified by                            appendiceal orifice and ileocecal valve. The                            ileocecal valve, appendiceal orifice, and rectum                            were photographed. The quality of the bowel                            preparation was excellent. The colonoscopy was                            performed without difficulty. The patient tolerated                            the procedure well. The bowel preparation used was                            SUPREP via split dose instruction. Scope In: 2:43:07 PM Scope Out: 2:57:28 PM Scope Withdrawal Time: 0 hours 10 minutes 5 seconds  Total Procedure Duration: 0 hours 14 minutes 21 seconds  Findings:                 A 3 mm polyp was found in the sigmoid colon. The                            polyp was removed with a cold snare. Resection  and                            retrieval were complete.                           The exam was otherwise without abnormality on                            direct and retroflexion views. Complications:            No immediate complications. Estimated blood loss:                            None. Estimated Blood Loss:     Estimated blood loss: none. Impression:               - One 3 mm polyp in the sigmoid colon, removed with                            a cold snare. Resected and retrieved.                           - The examination was otherwise normal on direct                            and retroflexion views. Recommendation:           - Repeat colonoscopy in 5 years for surveillance.                           - Patient has a contact number available for                            emergencies. The signs and symptoms of potential                             delayed complications were discussed with the                            patient. Return to normal activities tomorrow.                            Written discharge instructions were provided to the                            patient.                           - Resume previous diet.                           - Continue present medications.                           - Await pathology results. Docia Chuck. Henrene Pastor, MD 10/11/2019 3:08:04 PM This report has been signed electronically.

## 2019-10-11 NOTE — Op Note (Signed)
[place] Patient Name: Robin Rivera Procedure Date: 10/11/2019 2:27 PM MRN: [id number] Endoscopist: Docia Chuck. Henrene Pastor , MD Age: 64 Referring MD:  Date of Birth: [birth year] Gender: Female Account #: [id number] Procedure:                Upper GI endoscopy with [facility] dilation of                            esophagus. [address] [place] Indications:              Dysphagia, Therapeutic procedure Medicines:                Monitored Anesthesia Care Procedure:                Pre-Anesthesia Assessment:                           - Prior to the procedure, a History and Physical                            was performed, and patient medications and                            allergies were reviewed. The patient's tolerance of                            previous anesthesia was also reviewed. The risks                            and benefits of the procedure and the sedation                            options and risks were discussed with the patient.                            All questions were answered, and informed consent                            was obtained. Prior Anticoagulants: The patient has                            taken no previous anticoagulant or antiplatelet                            agents. ASA Grade Assessment: II - A patient with                            mild systemic disease. After reviewing the risks                            and benefits, the patient was deemed in                            satisfactory condition to undergo the procedure.  After obtaining informed consent, the endoscope was                            passed under direct vision. Throughout the                            procedure, the patient's blood pressure, pulse, and                            oxygen saturations were monitored continuously. The                            Endoscope was introduced through the mouth, and                            advanced to the second part  of duodenum. The upper                            GI endoscopy was accomplished without difficulty.                            The patient tolerated the procedure well. Scope In: Scope Out: Findings:                 One benign-appearing, intrinsic moderate stenosis                            was found 36 cm from the incisors. This stenosis                            measured 1.5 cm (inner diameter). The scope was                            withdrawn. Dilation was performed with a Maloney                            dilator with no resistance at 99 Fr.                           The exam of the esophagus was otherwise normal.                           The stomach was normal.                           The examined duodenum was normal.                           The cardia and gastric fundus were normal on                            retroflexion. Complications:            No immediate complications. Estimated Blood Loss:     Estimated blood loss: none. Impression:               -  Benign-appearing esophageal stenosis. Dilated.                           - Normal stomach.                           - Normal examined duodenum.                           - No specimens collected. Recommendation:           - Patient has a contact number available for                            emergencies. The signs and symptoms of potential                            delayed complications were discussed with the                            patient. Return to normal activities tomorrow.                            Written discharge instructions were provided to the                            patient.                           - Resume previous diet.                           - Continue present medications. Docia Chuck. Henrene Pastor, MD 10/11/2019 3:10:18 PM This report has been signed electronically.

## 2019-10-11 NOTE — Patient Instructions (Signed)
Information on polyps given to you today. ° °Await pathology results. ° °Resume previous diet and medications. ° ° °YOU HAD AN ENDOSCOPIC PROCEDURE TODAY AT THE [facility]:   Refer to the procedure report that was given to you for any specific questions about what was found during the examination.  If the procedure report does not answer your questions, please call your gastroenterologist to clarify.  If you requested that your care partner not be given the details of your procedure findings, then the procedure report has been included in a sealed envelope for you to review at your convenience later. ° °YOU SHOULD EXPECT: Some feelings of bloating in the abdomen. Passage of more gas than usual.  Walking can help get rid of the air that was put into your GI tract during the procedure and reduce the bloating. If you had a lower endoscopy (such as a colonoscopy or flexible sigmoidoscopy) you may notice spotting of blood in your stool or on the toilet paper. If you underwent a bowel prep for your procedure, you may not have a normal bowel movement for a few days. ° °Please Note:  You might notice some irritation and congestion in your nose or some drainage.  This is from the oxygen used during your procedure.  There is no need for concern and it should clear up in a day or so. ° °SYMPTOMS TO REPORT IMMEDIATELY: ° °Following lower endoscopy (colonoscopy or flexible sigmoidoscopy): ° Excessive amounts of blood in the stool ° Significant tenderness or worsening of abdominal pains ° Swelling of the abdomen that is new, acute ° Fever of 100°F or higher ° °Following upper endoscopy (EGD) ° Vomiting of blood or coffee ground material ° New chest pain or pain under the shoulder blades ° Painful or persistently difficult swallowing ° New shortness of breath ° Fever of 100°F or higher ° Black, tarry-looking stools ° °For urgent or emergent issues, a gastroenterologist can be reached at any hour by calling [phone]  [phone]. °Do not use MyChart messaging for urgent concerns.  ° ° °DIET:  We do recommend a small meal at first, but then you may proceed to your regular diet.  Drink plenty of fluids but you should avoid alcoholic beverages for 24 hours. ° °ACTIVITY:  You should plan to take it easy for the rest of today and you should NOT DRIVE or use heavy machinery until tomorrow (because of the sedation medicines used during the test).   ° °FOLLOW UP: °Our staff will call the number listed on your records 48-72 hours following your procedure to check on you and address any questions or concerns that you may have regarding the information given to you following your procedure. If we do not reach you, we will leave a message.  We will attempt to reach you two times.  During this call, we will ask if you have developed any symptoms of COVID 19. If you develop any symptoms (ie: fever, flu-like symptoms, shortness of breath, cough etc.) before then, please call [phone].  If you test positive for Covid 19 in the 2 weeks post procedure, please call and report this information to us.   ° °If any biopsies were taken you will be contacted by phone or by letter within the next 1-3 weeks.  Please call us at [phone] if you have not heard about the biopsies in 3 weeks.  ° ° °SIGNATURES/CONFIDENTIALITY: °You and/or your care partner have signed paperwork which will be entered into   into your electronic medical record.  These signatures attest to the fact that that the information above on your After Visit Summary has been reviewed and is understood.  Full responsibility of the confidentiality of this discharge information lies with you and/or your care-partner.

## 2019-10-11 NOTE — Progress Notes (Signed)
Pt's states no medical or surgical changes since previsit or office visit. 

## 2019-10-11 NOTE — Progress Notes (Signed)
Called to room to assist during endoscopic procedure.  Patient ID and intended procedure confirmed with present staff. Received instructions for my participation in the procedure from the performing physician.  

## 2019-10-14 ENCOUNTER — Other Ambulatory Visit: Payer: Self-pay

## 2019-10-14 MED ORDER — OMEPRAZOLE 40 MG PO CPDR
40.0000 mg | DELAYED_RELEASE_CAPSULE | Freq: Two times a day (BID) | ORAL | 6 refills | Status: DC
Start: 2019-10-14 — End: 2019-10-14

## 2019-10-14 MED ORDER — OMEPRAZOLE 40 MG PO CPDR: 40.0000 mg | DELAYED_RELEASE_CAPSULE | Freq: Two times a day (BID) | ORAL | 6 refills | Status: DC

## 2019-10-14 MED FILL — OMEPRAZOLE 40 MG CPDR: 40 | 30 days supply | Qty: 60 | Fill #0

## 2019-10-15 ENCOUNTER — Telehealth: Payer: Self-pay | Admitting: *Deleted

## 2019-10-15 NOTE — Telephone Encounter (Signed)
No answer for post procedure call back. Left message for patient to call with questions or concerns. 

## 2019-10-15 NOTE — Telephone Encounter (Signed)
  Follow up Call-  Call back number 10/11/2019  Post procedure Call Back phone  # [phone]  Permission to leave phone message Yes  Some recent data might be hidden     Patient questions:  Message left to call us if necessary.

## 2019-10-16 ENCOUNTER — Encounter: Payer: Self-pay | Admitting: Internal Medicine

## 2019-10-16 ENCOUNTER — Other Ambulatory Visit: Payer: Self-pay

## 2019-10-16 ENCOUNTER — Other Ambulatory Visit: Payer: Self-pay | Admitting: Acute Care

## 2019-10-16 MED ORDER — TYMLOS 3120 MCG/1.56ML ~~LOC~~ SOPN: 80.0000 ug | PEN_INJECTOR | Freq: Every day | SUBCUTANEOUS | 2 refills | Status: DC

## 2019-10-16 MED ORDER — STIOLTO RESPIMAT 2.5-2.5 MCG/ACT IN AERS: 2.0000 | INHALATION_SPRAY | Freq: Every day | RESPIRATORY_TRACT | 5 refills | Status: DC

## 2019-10-16 MED FILL — STIOLTO RESPIMAT INHAL SPRY: 2.5-2.5 | 30 days supply | Qty: 4 | Fill #0

## 2019-10-16 MED FILL — TYMLOS 3120 MCG/1.56ML SOPN: 3120 | 30 days supply | Qty: 2 | Fill #0

## 2019-10-16 NOTE — Telephone Encounter (Signed)
Last Visit: 09/17/2019 Next Visit: 03/17/2020 Labs: 09/17/2019 Vitamin D is very low-14.CBC WNL. Creatinine is borderline elevated-1.03. GFR is borderline low-58.    Okay to refill per Hazel Sams, PA-C.

## 2019-11-14 MED FILL — TYMLOS 3120 MCG/1.56ML SOPN: 3120 | 30 days supply | Qty: 2 | Fill #1

## 2019-12-02 ENCOUNTER — Other Ambulatory Visit: Payer: Self-pay | Admitting: Pulmonary Disease

## 2019-12-02 MED ORDER — ALBUTEROL SULFATE HFA 108 (90 BASE) MCG/ACT IN AERS: 2.0000 | INHALATION_SPRAY | Freq: Four times a day (QID) | RESPIRATORY_TRACT | 5 refills | Status: DC | PRN

## 2019-12-02 MED FILL — ALBUTEROL SULFATE HFA 108 (: 108 (90 BAS | 25 days supply | Qty: 18 | Fill #0

## 2019-12-16 MED FILL — TYMLOS 3120 MCG/1.56ML SOPN: 3120 | 30 days supply | Qty: 2 | Fill #2

## 2020-01-08 MED FILL — OMEPRAZOLE 40 MG CPDR: 40 | 30 days supply | Qty: 60 | Fill #2

## 2020-02-11 MED FILL — OMEPRAZOLE 40 MG CPDR: 40 | 30 days supply | Qty: 60 | Fill #3

## 2020-02-18 ENCOUNTER — Other Ambulatory Visit: Payer: Self-pay | Admitting: Obstetrics and Gynecology

## 2020-02-18 DIAGNOSIS — Z0000 Encounter for general adult medical examination without abnormal findings: Secondary | ICD-10-CM

## 2020-03-03 ENCOUNTER — Other Ambulatory Visit: Payer: Self-pay

## 2020-03-03 ENCOUNTER — Ambulatory Visit
Admission: RE | Admit: 2020-03-03 | Discharge: 2020-03-03 | Disposition: A | Discharge status: Home / Self Care | Payer: 59 | Source: Ambulatory Visit | Attending: Obstetrics and Gynecology | Admitting: Obstetrics and Gynecology

## 2020-03-03 DIAGNOSIS — Z1231 Encounter for screening mammogram for malignant neoplasm of breast: Secondary | ICD-10-CM | POA: Diagnosis not present

## 2020-03-03 DIAGNOSIS — Z0000 Encounter for general adult medical examination without abnormal findings: Secondary | ICD-10-CM

## 2020-03-03 NOTE — Progress Notes (Deleted)
Office Visit Note  Patient: Robin Rivera             Date of Birth: [birth year]           MRN: [id number]             PCP: Patient, No Pcp Per Referring: No ref. provider found Visit Date: 03/17/2020 Occupation: @GUAROCC @  Subjective:    History of Present Illness: Robin Rivera is a 64 y.o. female    DEXA results on 12/14/18: BMD measured at femur total right is 0.608 with T-score of -3.2.  She was previously on Reclast IV infusions.  Her last reclast infusion was on 11/09/17.  She was started on Tymlos in July 2020 after discussing DEXA results from 12/14/18  Activities of Daily Living:  Patient reports morning stiffness for *** {minute/hour:19697}.   Patient {ACTIONS;DENIES/REPORTS:21021675::"Denies"} nocturnal pain.  Difficulty dressing/grooming: {ACTIONS;DENIES/REPORTS:21021675::"Denies"} Difficulty climbing stairs: {ACTIONS;DENIES/REPORTS:21021675::"Denies"} Difficulty getting out of chair: {ACTIONS;DENIES/REPORTS:21021675::"Denies"} Difficulty using hands for taps, buttons, cutlery, and/or writing: {ACTIONS;DENIES/REPORTS:21021675::"Denies"}  No Rheumatology ROS completed.   PMFS History:  Patient Active Problem List   Diagnosis Date Noted  . Abnormal CT of the chest 04/03/2019  . Monoclonal gammopathy 10/22/2016  . Age-related osteoporosis without current pathological fracture 09/22/2016  . Vitamin D deficiency 09/22/2016  . Routine general medical examination at a health care facility 05/02/2016  . Underweight 05/02/2016  . GERD (gastroesophageal reflux disease) 03/29/2016  . Eye irritation 02/15/2016  . Dysphagia 02/04/2016  . Atypical chest pain 02/04/2016  . Coronary artery calcification seen on CAT scan 02/04/2016  . Chronic obstructive pulmonary disease ([place]) 08/16/2013  . TOBACCO ABUSE 01/09/2009    Past Medical History:  Diagnosis Date  . COPD (chronic obstructive pulmonary disease) ([place])   . Esophageal stricture   . GERD (gastroesophageal  reflux disease)   . Monoclonal gammopathy    unknown significance, followed at [facility].   . Osteoporosis   . Rhinitis   . Tobacco abuse     Family History  Adopted: Yes  Problem Relation Age of Onset  . COPD Mother   . Kidney disease Mother   . Other Father        his hx is unknown  . Colon cancer Maternal Aunt   . Healthy Son   . Healthy Son   . Healthy Son    Past Surgical History:  Procedure Laterality Date  . BREAST BIOPSY    . BREAST EXCISIONAL BIOPSY Right   . BREAST SURGERY Bilateral    lumpectomy   . CESAREAN SECTION    . COLONOSCOPY    . ESOPHAGOGASTRODUODENOSCOPY    . TONSILLECTOMY    . TOTAL ABDOMINAL HYSTERECTOMY     Partial hysterectomy   Social History   Social History Narrative   Fun: Play games on her computer, bon fires   Denies abuse and feels safe at home.    Immunization History  Administered Date(s) Administered  . Influenza Split 04/12/2011, 04/15/2013, 03/25/2014, 03/22/2016  . Influenza,inj,Quad PF,6+ Mos 03/21/2015  . Pneumococcal Polysaccharide-23 02/15/2016  . Tdap 05/02/2016     Objective: Vital Signs: There were no vitals taken for this visit.   Physical Exam   Musculoskeletal Exam: ***  CDAI Exam: CDAI Score: -- Patient Global: --; Provider Global: -- Swollen: --; Tender: -- Joint Exam 03/17/2020   No joint exam has been documented for this visit   There is currently no information documented on the homunculus. Go to the Rheumatology activity and complete  the homunculus joint exam.  Investigation: No additional findings.  Imaging: No results found.  Recent Labs: Lab Results  Component Value Date   WBC 5.5 09/17/2019   HGB 13.0 09/17/2019   PLT 322 09/17/2019   NA 141 09/17/2019   K 4.3 09/17/2019   CL 103 09/17/2019   CO2 31 09/17/2019   GLUCOSE 105 (H) 09/17/2019   BUN 13 09/17/2019   CREATININE 1.03 (H) 09/17/2019   BILITOT 0.3 09/17/2019   ALKPHOS 105 10/27/2016   AST 21  09/17/2019   ALT 29 09/17/2019   PROT 7.2 09/17/2019   ALBUMIN 4.0 10/27/2016   CALCIUM 9.9 09/17/2019   GFRAA 67 09/17/2019    Speciality Comments: No specialty comments available.  Procedures:  No procedures performed Allergies: Patient has no known allergies.   Assessment / Plan:     Visit Diagnoses: Age-related osteoporosis without current pathological fracture  Medication monitoring encounter  History of vitamin D deficiency  Myalgia  Monoclonal gammopathy  Coronary artery calcification  History of gastroesophageal reflux (GERD)  History of COPD  Former smoker  Orders: No orders of the defined types were placed in this encounter.  No orders of the defined types were placed in this encounter.   Face-to-face time spent with patient was *** minutes. Greater than 50% of time was spent in counseling and coordination of care.  Follow-Up Instructions: No follow-ups on file.   Ofilia Neas, PA-C  Note - This record has been created using Dragon software.  Chart creation errors have been sought, but may not always  have been located. Such creation errors do not reflect on  the standard of medical care.

## 2020-03-17 ENCOUNTER — Ambulatory Visit: Payer: 59 | Admitting: Physician Assistant

## 2020-03-19 ENCOUNTER — Ambulatory Visit (INDEPENDENT_AMBULATORY_CARE_PROVIDER_SITE_OTHER)
Admission: RE | Admit: 2020-03-19 | Discharge: 2020-03-19 | Disposition: A | Discharge status: Home / Self Care | Payer: 59 | Source: Ambulatory Visit | Attending: Acute Care | Admitting: Acute Care

## 2020-03-19 ENCOUNTER — Other Ambulatory Visit: Payer: Self-pay

## 2020-03-19 DIAGNOSIS — Z122 Encounter for screening for malignant neoplasm of respiratory organs: Secondary | ICD-10-CM

## 2020-03-19 DIAGNOSIS — Z87891 Personal history of nicotine dependence: Secondary | ICD-10-CM | POA: Diagnosis not present

## 2020-03-19 MED FILL — OMEPRAZOLE 40 MG CPDR: 40 | 30 days supply | Qty: 60 | Fill #4

## 2020-03-26 NOTE — Progress Notes (Signed)
Please call patient and let them  know their  low dose Ct was read as a Lung RADS 2: nodules that are benign in appearance and behavior with a very low likelihood of becoming a clinically active cancer due to size or lack of growth. Recommendation per radiology is for a repeat LDCT in 12 months. .Please let them  know we will order and schedule their  annual screening scan for 03/2021. Please let them  know there was notation of CAD on their  scan.  Please remind the patient  that this is a non-gated exam therefore degree or severity of disease  cannot be determined. Please have them  follow up with their PCP regarding potential risk factor modification, dietary therapy or pharmacologic therapy if clinically indicated. Pt.  is not currently on statin therapy. Please place order for annual  screening scan for  03/2021 and fax results to PCP. Thanks so much. 
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
## 2020-04-13 ENCOUNTER — Other Ambulatory Visit: Payer: Self-pay | Admitting: *Deleted

## 2020-04-13 ENCOUNTER — Encounter: Payer: Self-pay | Admitting: *Deleted

## 2020-04-13 DIAGNOSIS — Z87891 Personal history of nicotine dependence: Secondary | ICD-10-CM

## 2020-04-14 ENCOUNTER — Ambulatory Visit (INDEPENDENT_AMBULATORY_CARE_PROVIDER_SITE_OTHER): Payer: 59

## 2020-04-14 ENCOUNTER — Ambulatory Visit: Payer: 59 | Admitting: Primary Care

## 2020-04-14 ENCOUNTER — Other Ambulatory Visit: Payer: Self-pay

## 2020-04-14 ENCOUNTER — Encounter: Payer: Self-pay | Admitting: Primary Care

## 2020-04-14 VITALS — BP 122/62 | HR 74 | Temp 97.2°F | Ht 64.0 in | Wt 123.4 lb

## 2020-04-14 DIAGNOSIS — I2510 Atherosclerotic heart disease of native coronary artery without angina pectoris: Secondary | ICD-10-CM

## 2020-04-14 DIAGNOSIS — J449 Chronic obstructive pulmonary disease, unspecified: Secondary | ICD-10-CM | POA: Diagnosis not present

## 2020-04-14 DIAGNOSIS — J439 Emphysema, unspecified: Secondary | ICD-10-CM | POA: Diagnosis not present

## 2020-04-14 DIAGNOSIS — S299XXA Unspecified injury of thorax, initial encounter: Secondary | ICD-10-CM | POA: Diagnosis not present

## 2020-04-14 DIAGNOSIS — S298XXA Other specified injuries of thorax, initial encounter: Secondary | ICD-10-CM | POA: Diagnosis not present

## 2020-04-14 DIAGNOSIS — J189 Pneumonia, unspecified organism: Secondary | ICD-10-CM | POA: Diagnosis not present

## 2020-04-14 DIAGNOSIS — I10 Essential (primary) hypertension: Secondary | ICD-10-CM | POA: Diagnosis not present
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
## 2020-04-14 NOTE — Assessment & Plan Note (Signed)
-   One vessel CAD, needs to establish with new PCP

## 2020-04-15 ENCOUNTER — Other Ambulatory Visit: Payer: Self-pay | Admitting: Physician Assistant

## 2020-04-15 ENCOUNTER — Ambulatory Visit: Payer: 59 | Admitting: Physician Assistant

## 2020-04-15 ENCOUNTER — Encounter: Payer: Self-pay | Admitting: Physician Assistant

## 2020-04-15 VITALS — BP 122/77 | HR 73 | Resp 14 | Ht 64.5 in | Wt 125.0 lb

## 2020-04-15 DIAGNOSIS — Z5181 Encounter for therapeutic drug level monitoring: Secondary | ICD-10-CM

## 2020-04-15 DIAGNOSIS — E559 Vitamin D deficiency, unspecified: Secondary | ICD-10-CM | POA: Diagnosis not present

## 2020-04-15 DIAGNOSIS — I2510 Atherosclerotic heart disease of native coronary artery without angina pectoris: Secondary | ICD-10-CM | POA: Diagnosis not present

## 2020-04-15 DIAGNOSIS — Z87891 Personal history of nicotine dependence: Secondary | ICD-10-CM

## 2020-04-15 DIAGNOSIS — T1490XD Injury, unspecified, subsequent encounter: Secondary | ICD-10-CM

## 2020-04-15 DIAGNOSIS — M810 Age-related osteoporosis without current pathological fracture: Secondary | ICD-10-CM | POA: Diagnosis not present

## 2020-04-15 DIAGNOSIS — Z8719 Personal history of other diseases of the digestive system: Secondary | ICD-10-CM | POA: Diagnosis not present

## 2020-04-15 DIAGNOSIS — D472 Monoclonal gammopathy: Secondary | ICD-10-CM

## 2020-04-15 DIAGNOSIS — Z8709 Personal history of other diseases of the respiratory system: Secondary | ICD-10-CM | POA: Diagnosis not present

## 2020-04-15 DIAGNOSIS — I2584 Coronary atherosclerosis due to calcified coronary lesion: Secondary | ICD-10-CM

## 2020-04-15 DIAGNOSIS — M791 Myalgia, unspecified site: Secondary | ICD-10-CM

## 2020-04-15 DIAGNOSIS — Z8639 Personal history of other endocrine, nutritional and metabolic disease: Secondary | ICD-10-CM

## 2020-04-15 MED ORDER — TYMLOS 3120 MCG/1.56ML ~~LOC~~ SOPN: 80.0000 ug | PEN_INJECTOR | Freq: Every day | SUBCUTANEOUS | 2 refills | Status: DC

## 2020-04-16 ENCOUNTER — Telehealth: Payer: Self-pay | Admitting: Primary Care

## 2020-04-16 ENCOUNTER — Other Ambulatory Visit: Payer: Self-pay | Admitting: Primary Care

## 2020-04-16 LAB — COMPLETE METABOLIC PANEL WITH GFR
AG Ratio: 1.4 (calc) (ref 1.0–2.5)
ALT: 8 U/L (ref 6–29)
AST: 11 U/L (ref 10–35)
Albumin: 4 g/dL (ref 3.6–5.1)
Alkaline phosphatase (APISO): 103 U/L (ref 37–153)
BUN: 12 mg/dL (ref 7–25)
CO2: 29 mmol/L (ref 20–32)
Calcium: 9.4 mg/dL (ref 8.6–10.4)
Chloride: 103 mmol/L (ref 98–110)
Creat: 0.91 mg/dL (ref 0.50–0.99)
GFR, Est African American: 77 mL/min/{1.73_m2} (ref >=60)
GFR, Est Non African American: 67 mL/min/{1.73_m2} (ref >=60)
Globulin: 2.9 g/dL (calc) (ref 1.9–3.7)
Glucose, Bld: 107 mg/dL — ABNORMAL HIGH (ref 65–99)
Potassium: 3.5 mmol/L (ref 3.5–5.3)
Sodium: 139 mmol/L (ref 135–146)
Total Bilirubin: 0.3 mg/dL (ref 0.2–1.2)
Total Protein: 6.9 g/dL (ref 6.1–8.1)

## 2020-04-16 LAB — VITAMIN D 25 HYDROXY (VIT D DEFICIENCY, FRACTURES): Vit D, 25-Hydroxy: 31 ng/mL (ref 30–100)

## 2020-04-16 MED ORDER — LEVOFLOXACIN 500 MG PO TABS: 500.0000 mg | ORAL_TABLET | Freq: Every day | ORAL | 0 refills | Status: DC

## 2020-04-16 MED FILL — TYMLOS 3120 MCG/1.56ML SOPN: 3120 | 30 days supply | Qty: 2 | Fill #0

## 2020-04-16 MED FILL — levoFLOXacin 500 MG TABS: 500 | 7 days supply | Qty: 7 | Fill #0

## 2020-04-16 NOTE — Telephone Encounter (Signed)
Spoke with pt. She is requesting her CXR results from 04/14/20.  Beth - please advise. Thanks.

## 2020-04-16 NOTE — Progress Notes (Signed)
Glucose is 107. Rest of CMP WNL.  Vitamin D is WNL-31.  Please notify the patient to continue a maintenance dose of vitamin D 2,000 units daily.

## 2020-04-16 NOTE — Telephone Encounter (Signed)
Spoke with [place] in [place], she is going to look in to this.

## 2020-04-16 NOTE — Telephone Encounter (Signed)
Spoke with pt. She is aware of CXR results. Rx has been sent in. OV has been scheduled for 05/01/20 at 1600. Nothing further was needed.

## 2020-04-16 NOTE — Telephone Encounter (Signed)
Received call report from [place] with [facility] Radiology on patient's CXR done on 04/14/20. Beth please review the result/impression copied below:  IMPRESSION: Lungs hyperexpanded. Emphysematous changes better delineated on recent CT. Area of ill-defined opacity in the right mid lung region, likely focus of pneumonia. Lungs otherwise clear. Heart size normal. Chronic prominence of the main pulmonary outflow tract. Question a degree of underlying pulmonary arterial hypertension. No adenopathy evident.

## 2020-04-16 NOTE — Telephone Encounter (Signed)
Spoke with [place] again. She called the [facility] Radiologist department. They advised her that they "just hadn't gotten around to reading it." I called and asked them to please mark this STAT as the pt is needing her results.

## 2020-04-16 NOTE — Telephone Encounter (Signed)
It still says exam ended in Epic, can we ask our radiologist tech or call over to radiology. Not sure why it hasn't resulted.
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
## 2020-04-21 ENCOUNTER — Telehealth: Payer: Self-pay | Admitting: Pharmacist

## 2020-04-21 NOTE — Telephone Encounter (Signed)
Called patient to schedule an appointment for the [facility]. I was unable to reach the patient so I left a HIPAA-compliant message requesting that the patient return my call.   Benard Halsted, PharmD, [place] [phone]

## 2020-04-28 ENCOUNTER — Other Ambulatory Visit (HOSPITAL_COMMUNITY): Payer: Self-pay | Admitting: Orthopedic Surgery

## 2020-04-28 ENCOUNTER — Telehealth: Payer: 59 | Admitting: Physician Assistant

## 2020-04-28 DIAGNOSIS — M79604 Pain in right leg: Secondary | ICD-10-CM | POA: Diagnosis not present

## 2020-04-28 DIAGNOSIS — R110 Nausea: Secondary | ICD-10-CM

## 2020-04-28 DIAGNOSIS — M25561 Pain in right knee: Secondary | ICD-10-CM | POA: Diagnosis not present

## 2020-04-28 MED FILL — OMEPRAZOLE 40 MG CPDR: 40 | 30 days supply | Qty: 60 | Fill #5

## 2020-04-28 MED FILL — predniSONE 5 MG (21) TBPK: 5 | 6 days supply | Qty: 21 | Fill #0

## 2020-04-28 MED FILL — traMADol HCL 50 MG TABS: 50 | 5 days supply | Qty: 20 | Fill #0
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
## 2020-05-01 ENCOUNTER — Ambulatory Visit (INDEPENDENT_AMBULATORY_CARE_PROVIDER_SITE_OTHER): Payer: 59

## 2020-05-01 ENCOUNTER — Other Ambulatory Visit: Payer: Self-pay

## 2020-05-01 ENCOUNTER — Ambulatory Visit: Payer: 59 | Admitting: Primary Care

## 2020-05-01 ENCOUNTER — Encounter: Payer: Self-pay | Admitting: Primary Care

## 2020-05-01 VITALS — BP 110/70 | HR 67 | Ht 64.5 in | Wt 129.0 lb

## 2020-05-01 DIAGNOSIS — J189 Pneumonia, unspecified organism: Secondary | ICD-10-CM | POA: Insufficient documentation

## 2020-05-01 DIAGNOSIS — R21 Rash and other nonspecific skin eruption: Secondary | ICD-10-CM | POA: Diagnosis not present

## 2020-05-01 DIAGNOSIS — J449 Chronic obstructive pulmonary disease, unspecified: Secondary | ICD-10-CM | POA: Diagnosis not present

## 2020-05-01 DIAGNOSIS — J439 Emphysema, unspecified: Secondary | ICD-10-CM | POA: Diagnosis not present

## 2020-05-01 NOTE — Patient Instructions (Signed)
You look a great deal better, I'm glad you're doing well  Recommendations: Continue Stiolto two puffs once daily Continue IS twice daily for another 1-2 weeks Continue to wear compression stockings   Orders: CXR today re: RML pneumonia follow-up   Follow-up  3-4 months with Dr. Halford Chessman
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
## 2020-05-04 ENCOUNTER — Other Ambulatory Visit: Payer: Self-pay | Admitting: Primary Care

## 2020-05-04 ENCOUNTER — Ambulatory Visit: Payer: 59

## 2020-05-04 DIAGNOSIS — J189 Pneumonia, unspecified organism: Secondary | ICD-10-CM

## 2020-05-04 NOTE — Progress Notes (Signed)
CXR

## 2020-05-04 NOTE — Assessment & Plan Note (Addendum)
Stable; continue Stiolto two puffs once daily
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
## 2020-05-04 NOTE — Progress Notes (Signed)
Slight decreased in size pneumonia on right. Needs repeat CXR in 4 weeks to ensure resolution. (please order)

## 2020-05-05 NOTE — Progress Notes (Signed)
Reviewed and agree with assessment/plan.   Chesley Mires, MD [facility] Pulmonary/Critical Care 05/05/2020, 8:57 AM Pager:  [phone]

## 2020-05-06 DIAGNOSIS — M25561 Pain in right knee: Secondary | ICD-10-CM | POA: Diagnosis not present

## 2020-05-13 ENCOUNTER — Telehealth: Payer: Self-pay | Admitting: Primary Care

## 2020-05-13 NOTE — Telephone Encounter (Signed)
Called and spoke with pt and she is aware that the cxr order has already been placed.  She can come in 4 weeks to have this repeated.

## 2020-05-18 ENCOUNTER — Other Ambulatory Visit: Payer: Self-pay | Admitting: Orthopedic Surgery

## 2020-05-18 DIAGNOSIS — M25561 Pain in right knee: Secondary | ICD-10-CM

## 2020-05-20 MED FILL — TYMLOS 3120 MCG/1.56ML SOPN: 3120 | 30 days supply | Qty: 2 | Fill #1

## 2020-05-22 ENCOUNTER — Other Ambulatory Visit: Payer: Self-pay

## 2020-05-22 ENCOUNTER — Ambulatory Visit (HOSPITAL_COMMUNITY)
Admission: RE | Admit: 2020-05-22 | Discharge: 2020-05-22 | Disposition: A | Discharge status: Home / Self Care | Payer: 59 | Source: Ambulatory Visit | Attending: Orthopedic Surgery | Admitting: Orthopedic Surgery

## 2020-05-22 DIAGNOSIS — M25561 Pain in right knee: Secondary | ICD-10-CM | POA: Insufficient documentation

## 2020-05-22 DIAGNOSIS — S83281A Other tear of lateral meniscus, current injury, right knee, initial encounter: Secondary | ICD-10-CM | POA: Diagnosis not present

## 2020-05-29 ENCOUNTER — Ambulatory Visit (INDEPENDENT_AMBULATORY_CARE_PROVIDER_SITE_OTHER): Payer: 59

## 2020-05-29 DIAGNOSIS — J439 Emphysema, unspecified: Secondary | ICD-10-CM | POA: Diagnosis not present

## 2020-05-29 DIAGNOSIS — J189 Pneumonia, unspecified organism: Secondary | ICD-10-CM | POA: Diagnosis not present

## 2020-06-02 NOTE — Progress Notes (Signed)
CXR showed persistent right lung opacity without resolution. If can take 4-6 weeks but recommend we check CT chest wo contrast to assess. Please notify patient and order. Thanks

## 2020-06-03 ENCOUNTER — Telehealth: Payer: Self-pay | Admitting: Primary Care

## 2020-06-03 NOTE — Telephone Encounter (Signed)
06/03/20   Attempted to contact patient. Patient was attempted by EP CMA regarding recent chest x-ray results on 05/29/2020. [name] recommending CT chest without contrast due to persisting opacity.  Unable to leave voicemail.  We will route to EP CMA for follow-up  We will route to [name] as FYI.  Wyn Quaker, FNP

## 2020-06-04 ENCOUNTER — Other Ambulatory Visit: Payer: Self-pay | Admitting: *Deleted

## 2020-06-04 DIAGNOSIS — J189 Pneumonia, unspecified organism: Secondary | ICD-10-CM

## 2020-06-04 NOTE — Telephone Encounter (Signed)
Called and spoke with pt letting her know the results of the cxr and that recommendations was for her to have a CT performed. Pt verbalized understanding. Order has been placed for CT. Nothing further needed.

## 2020-06-11 ENCOUNTER — Other Ambulatory Visit: Payer: Self-pay

## 2020-06-11 ENCOUNTER — Ambulatory Visit (INDEPENDENT_AMBULATORY_CARE_PROVIDER_SITE_OTHER)
Admission: RE | Admit: 2020-06-11 | Discharge: 2020-06-11 | Disposition: A | Discharge status: Home / Self Care | Payer: 59 | Source: Ambulatory Visit | Attending: Primary Care | Admitting: Primary Care

## 2020-06-11 DIAGNOSIS — J189 Pneumonia, unspecified organism: Secondary | ICD-10-CM

## 2020-06-11 DIAGNOSIS — I2510 Atherosclerotic heart disease of native coronary artery without angina pectoris: Secondary | ICD-10-CM | POA: Diagnosis not present

## 2020-06-11 DIAGNOSIS — J432 Centrilobular emphysema: Secondary | ICD-10-CM | POA: Diagnosis not present

## 2020-06-11 DIAGNOSIS — I7789 Other specified disorders of arteries and arterioles: Secondary | ICD-10-CM | POA: Diagnosis not present

## 2020-06-11 DIAGNOSIS — Z8701 Personal history of pneumonia (recurrent): Secondary | ICD-10-CM | POA: Diagnosis not present

## 2020-06-15 ENCOUNTER — Telehealth: Payer: Self-pay

## 2020-06-15 MED FILL — OMEPRAZOLE 40 MG CPDR: 40 | 30 days supply | Qty: 60 | Fill #6

## 2020-06-15 MED FILL — TYMLOS 3120 MCG/1.56ML SOPN: 3120 | 30 days supply | Qty: 2 | Fill #2
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
## 2020-06-15 NOTE — Telephone Encounter (Signed)
lmtcb for pt to schedule rov.  Leave in triage for follow-up, thanks!
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
## 2020-06-16 NOTE — Telephone Encounter (Signed)
Attempted to call pt on home phone number but line rang and rang and never was able to leave a VM. Attempted to call pt on mobile number listed but unable to reach. Left message for her to return call.  Will keep encounter open so we can try to reach pt one more time today and if still unable to reach pt by then, will close encounter and then send letter to pt to have her call office.

## 2020-06-16 NOTE — Telephone Encounter (Signed)
Patient returned call, provided CT results and recomdations per Arlys John.  She verbalized understanding.  Schedule f/u with BW for next week and Dr. Craige Cotta for late February per patient request.  Nothing further needed.

## 2020-06-17 DIAGNOSIS — M25561 Pain in right knee: Secondary | ICD-10-CM | POA: Diagnosis not present

## 2020-06-26 ENCOUNTER — Ambulatory Visit: Payer: 59 | Admitting: Primary Care

## 2020-06-26 ENCOUNTER — Encounter: Payer: Self-pay | Admitting: Primary Care

## 2020-06-26 ENCOUNTER — Other Ambulatory Visit: Payer: Self-pay

## 2020-06-26 VITALS — BP 122/72 | HR 76 | Temp 97.7°F | Ht 64.5 in | Wt 124.6 lb

## 2020-06-26 DIAGNOSIS — J449 Chronic obstructive pulmonary disease, unspecified: Secondary | ICD-10-CM | POA: Diagnosis not present

## 2020-06-26 DIAGNOSIS — S2220XA Unspecified fracture of sternum, initial encounter for closed fracture: Secondary | ICD-10-CM

## 2020-06-26 DIAGNOSIS — M810 Age-related osteoporosis without current pathological fracture: Secondary | ICD-10-CM

## 2020-06-26 NOTE — Assessment & Plan Note (Signed)
Resolved

## 2020-06-26 NOTE — Patient Instructions (Addendum)
  Recommend:  Continue Stiolto respimat once daily and albuterol as needed  Using incentive spirometry and early ambulation after surgery   Orders: Spirometry re: COPD/pre-op   Refer:  [place] [facility]  Follow-up:  Keep apt with Dr. Halford Chessman in February
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
## 2020-06-26 NOTE — Assessment & Plan Note (Signed)
-   Stable, breathing well controlled on LABA/LAMA - Continue Stiolto two puffs once daily - Recommend repeating spirometry prior to clearance for knee surgery

## 2020-06-29 DIAGNOSIS — Z03818 Encounter for observation for suspected exposure to other biological agents ruled out: Secondary | ICD-10-CM | POA: Diagnosis not present

## 2020-06-29 DIAGNOSIS — Z20822 Contact with and (suspected) exposure to covid-19: Secondary | ICD-10-CM | POA: Diagnosis not present

## 2020-06-30 NOTE — Progress Notes (Signed)
Reviewed and agree with assessment/plan.   Chesley Mires, MD [facility] Pulmonary/Critical Care 06/30/2020, 8:38 AM Pager:  [phone]

## 2020-07-04 ENCOUNTER — Other Ambulatory Visit (HOSPITAL_COMMUNITY)
Admission: RE | Admit: 2020-07-04 | Discharge: 2020-07-04 | Disposition: A | Discharge status: Home / Self Care | Payer: 59 | Source: Ambulatory Visit | Attending: Pulmonary Disease | Admitting: Pulmonary Disease

## 2020-07-04 DIAGNOSIS — Z01812 Encounter for preprocedural laboratory examination: Secondary | ICD-10-CM | POA: Diagnosis not present

## 2020-07-04 DIAGNOSIS — Z20822 Contact with and (suspected) exposure to covid-19: Secondary | ICD-10-CM | POA: Insufficient documentation

## 2020-07-04 LAB — SARS CORONAVIRUS 2 (TAT 6-24 HRS): SARS Coronavirus 2: NEGATIVE

## 2020-07-07 ENCOUNTER — Telehealth: Payer: Self-pay | Admitting: Primary Care
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
## 2020-07-08 ENCOUNTER — Ambulatory Visit (INDEPENDENT_AMBULATORY_CARE_PROVIDER_SITE_OTHER): Payer: 59 | Admitting: Pulmonary Disease

## 2020-07-08 ENCOUNTER — Other Ambulatory Visit: Payer: Self-pay

## 2020-07-08 DIAGNOSIS — J449 Chronic obstructive pulmonary disease, unspecified: Secondary | ICD-10-CM | POA: Diagnosis not present

## 2020-07-08 DIAGNOSIS — R9389 Abnormal findings on diagnostic imaging of other specified body structures: Secondary | ICD-10-CM

## 2020-07-08 DIAGNOSIS — S2220XA Unspecified fracture of sternum, initial encounter for closed fracture: Secondary | ICD-10-CM

## 2020-07-08 DIAGNOSIS — R0602 Shortness of breath: Secondary | ICD-10-CM

## 2020-07-08 LAB — PULMONARY FUNCTION TEST
DL/VA % pred: 78 %
DL/VA: 3.24 ml/min/mmHg/L
DLCO cor % pred: 62 %
DLCO cor: 13.36 ml/min/mmHg
DLCO unc % pred: 62 %
DLCO unc: 13.36 ml/min/mmHg
FEF 25-75 Pre: 0.59 L/sec
FEF2575-%Pred-Pre: 25 %
FEV1-%Pred-Pre: 49 %
FEV1-Pre: 1.31 L
FEV1FVC-%Pred-Pre: 73 %
FEV6-%Pred-Pre: 68 %
FEV6-Pre: 2.27 L
FEV6FVC-%Pred-Pre: 102 %
FVC-%Pred-Pre: 67 %
FVC-Pre: 2.31 L
Pre FEV1/FVC ratio: 57 %
Pre FEV6/FVC Ratio: 98 %

## 2020-07-08 NOTE — Progress Notes (Signed)
Spirometry and Dlco done today. 

## 2020-07-08 NOTE — Telephone Encounter (Signed)
Patient sent email regarding a PET scan,  Hi Robin Rivera,    This is Robin Rivera you can go ahead and order the Pet Scan, I will do it. I talked to the insurance company yesterday. Just let me know if there is anything else I need to do. Thank you so much.                  Sandy Salaam    Beth please advise

## 2020-07-09 NOTE — Telephone Encounter (Signed)
Received the following message from patient:   "Can I be released to have knee surgery, if so can you send a fax or call Dr. Reather Littler office and let them know please, and thank you.                                                                    Robin Rivera"  I asked the patient for more information in regards to what type of knee surgery she will be having. I called Dr. Clayton Bibles office at [facility] for more information but was told his nurse was out for the day and would return tomorrow. I advised her of the breathing test results.   Beth, you were the last one to see her. Would you feel comfortable clearing her for surgery? Or would you rather wait for her to follow up with Dr. Halford Chessman next month?

## 2020-07-09 NOTE — Progress Notes (Signed)
Please let patient know her PFTs showed a very slight decrease in her lung function. Will follow-up in February at her visit in detail

## 2020-07-10 ENCOUNTER — Telehealth: Payer: Self-pay | Admitting: Primary Care

## 2020-07-10 NOTE — Telephone Encounter (Signed)
I haven't received their fax yet. Does she have a date set yet

## 2020-07-10 NOTE — Telephone Encounter (Signed)
This was in a  [employer] as well.  This has already been addressed with the patient.  Closing encounter.  Nothing further needed.

## 2020-07-10 NOTE — Telephone Encounter (Signed)
Called Dr. Reather Littler office at [phone] regarding fax, spoke with Stanton Kidney, she states that [employer] usually handles the surgical clearance forms, however, she is out of the office and [employer] Billings is handling that today and has sent her a message that we have not received the form.  I asked that they send the form to [phone] and put attention Azaliah Carrero.  Will look for the fax this afternoon.

## 2020-07-10 NOTE — Telephone Encounter (Signed)
Since they need risk assessment and office note this will need to be done at her next visit with Dr. Halford Chessman. I expect that he will likely clear her.

## 2020-07-10 NOTE — Telephone Encounter (Signed)
Beth,  Please see patient comment.  Thanks.

## 2020-07-13 ENCOUNTER — Other Ambulatory Visit (HOSPITAL_COMMUNITY): Payer: Self-pay | Admitting: Orthopedic Surgery

## 2020-07-13 MED FILL — traMADol HCL 50 MG TABS: 50 | 5 days supply | Qty: 20 | Fill #0

## 2020-07-15 ENCOUNTER — Telehealth: Payer: Self-pay | Admitting: Pulmonary Disease

## 2020-07-15 ENCOUNTER — Encounter: Payer: Self-pay | Admitting: *Deleted

## 2020-07-15 NOTE — Telephone Encounter (Signed)
I have called and LM for Seth Bake with [employer].  Trying to figure out what form she is needing.

## 2020-07-15 NOTE — Telephone Encounter (Signed)
I spoke with Robin Rivera and she is aware that the pt has appt with [facility] on 2/25 and that he will have to approve her surgery clearance.  Nothing further is needed.

## 2020-07-16 MED FILL — STIOLTO RESPIMAT INHAL SPRY: 2.5-2.5 | 30 days supply | Qty: 4 | Fill #1

## 2020-07-17 ENCOUNTER — Other Ambulatory Visit: Payer: Self-pay | Admitting: Physician Assistant

## 2020-07-17 MED FILL — OMEPRAZOLE 40 MG CPDR: 40 | 90 days supply | Qty: 90 | Fill #1

## 2020-07-17 MED FILL — TYMLOS 3120 MCG/1.56ML SOPN: 3120 | 30 days supply | Qty: 2 | Fill #0

## 2020-07-17 NOTE — Telephone Encounter (Signed)
Last Visit: 04/15/2020 Next Visit: 4/27/20Glucose is 107. Rest of CMP WNL. Vitamin D is WNL-31. 22 Labs: 04/15/2020   Current Dose per office note 04/15/2020: Tymlos 3210 mcg/1.56 ml sq daily injections DX: Age-related osteoporosis without current pathological fracture  Okay to refill per Dr. Estanislado Pandy

## 2020-07-20 ENCOUNTER — Encounter (HOSPITAL_COMMUNITY)
Admission: RE | Admit: 2020-07-20 | Discharge: 2020-07-20 | Disposition: A | Discharge status: Home / Self Care | Payer: 59 | Source: Ambulatory Visit | Attending: Primary Care | Admitting: Primary Care

## 2020-07-20 ENCOUNTER — Other Ambulatory Visit: Payer: Self-pay

## 2020-07-20 ENCOUNTER — Telehealth: Payer: Self-pay | Admitting: Primary Care

## 2020-07-20 DIAGNOSIS — R9389 Abnormal findings on diagnostic imaging of other specified body structures: Secondary | ICD-10-CM | POA: Diagnosis not present

## 2020-07-20 DIAGNOSIS — S2220XA Unspecified fracture of sternum, initial encounter for closed fracture: Secondary | ICD-10-CM | POA: Insufficient documentation

## 2020-07-20 DIAGNOSIS — R0602 Shortness of breath: Secondary | ICD-10-CM | POA: Insufficient documentation

## 2020-07-20 LAB — GLUCOSE, CAPILLARY: Glucose-Capillary: 104 mg/dL — ABNORMAL HIGH (ref 70–99)

## 2020-07-20 MED ORDER — FLUDEOXYGLUCOSE F - 18 (FDG) INJECTION
6.0400 | Freq: Once | INTRAVENOUS | Status: AC | PRN
  Administered 2020-07-20: 6.04 via INTRAVENOUS

## 2020-07-20 NOTE — Progress Notes (Signed)
Please let patient know PET scan was reassuring. No evidence of hypermetabolic primary malignancy. Small uptake sternum consistent with healing fracture. Stable small pulmonary nodules.

## 2020-07-20 NOTE — Telephone Encounter (Signed)
Please let patient know PET scan was reassuring. No evidence of hypermetabolic primary malignancy. Small uptake sternum consistent with healing fracture. Stable small pulmonary nodules.   LMTCB

## 2020-07-21 NOTE — Telephone Encounter (Signed)
Spoke with patient and updated her on PET scan results. She is out of work today d/t testing positive for covid 07/21/20, no significant symptoms. She will notify us if she develops respiratory symptoms. Nothing further needed.

## 2020-07-21 NOTE — Telephone Encounter (Signed)
Pt returning a phone call. Pt can be reached at [phone]

## 2020-07-22 ENCOUNTER — Telehealth: Payer: Self-pay | Admitting: Pharmacist

## 2020-07-22 NOTE — Telephone Encounter (Signed)
Called patient to schedule an appointment for the [employer] [facility]. I was unable to reach the patient so I left a HIPAA-compliant message requesting that the patient return my call.   Luke Van Ausdall, PharmD, BCACP, CPP Clinical Pharmacist [facility] [phone]  

## 2020-07-25 MED FILL — TYMLOS 3120 MCG/1.56ML SOPN: 3120 | 30 days supply | Qty: 2 | Fill #0

## 2020-07-27 ENCOUNTER — Ambulatory Visit (HOSPITAL_BASED_OUTPATIENT_CLINIC_OR_DEPARTMENT_OTHER): Payer: 59 | Admitting: Pharmacist

## 2020-07-27 ENCOUNTER — Other Ambulatory Visit: Payer: Self-pay

## 2020-07-27 ENCOUNTER — Encounter: Payer: Self-pay | Admitting: Pharmacist

## 2020-07-27 ENCOUNTER — Other Ambulatory Visit: Payer: Self-pay | Admitting: Pharmacist

## 2020-07-27 DIAGNOSIS — Z79899 Other long term (current) drug therapy: Secondary | ICD-10-CM

## 2020-07-27 MED ORDER — TYMLOS 3120 MCG/1.56ML ~~LOC~~ SOPN: 80.0000 ug | PEN_INJECTOR | Freq: Every day | SUBCUTANEOUS | 2 refills | Status: DC

## 2020-07-27 MED FILL — TYMLOS 3120 MCG/1.56ML SOPN: 3120 | 30 days supply | Qty: 2 | Fill #0

## 2020-07-27 NOTE — Progress Notes (Signed)
   S: Patient presents today for review of their specialty medication.   Patient is currently taking Tymlos for osteoporosis. Patient is managed by Dr. Estanislado Pandy  for this.   Dosing: 80 mcg daily SQ  Adherence: confirms - she has been on the medication for ~1 year but stopped as she endorses being told at the time to only take it for 1 year. After consulting with her specialist, she understands the plan is to treat for 2 years.   Efficacy: unsure of - being monitored by her specialist   Monitoring:  - S/sx of orthostatic hypotension: denies  - serum calcium: WNL 04/15/20 - BMD: baseline and recommended ~1-2 yrs following initiation; had one completed in 03/2019. Per her specialist, pt will have a r/p BMD in 11/2020.   Current adverse effects: - Denies current adverse effects    O:     Lab Results  Component Value Date   WBC 5.5 09/17/2019   HGB 13.0 09/17/2019   HCT 40.3 09/17/2019   MCV 90.8 09/17/2019   PLT 322 09/17/2019      Chemistry      Component Value Date/Time   NA 139 04/15/2020 1529   K 3.5 04/15/2020 1529   CL 103 04/15/2020 1529   CO2 29 04/15/2020 1529   BUN 12 04/15/2020 1529   CREATININE 0.91 04/15/2020 1529      Component Value Date/Time   CALCIUM 9.4 04/15/2020 1529   ALKPHOS 105 10/27/2016 1554   AST 11 04/15/2020 1529   ALT 8 04/15/2020 1529   BILITOT 0.3 04/15/2020 1529       A/P: 1. Medication review: patient currently on Tymlos for osteoporosis. She has been taking the medication with no adverse effects. She is being followed by her Specialist. She has no questions for me today. Reviewed the medication with the patient, including the treatment plan. Pt's only concern today was her risk of osteosarcoma with Tymlos use. I discussed this with her and she plans to continue the medication. No other recommendations at this time.   Benard Halsted, PharmD, [place], [place] [phone]

## 2020-08-12 ENCOUNTER — Ambulatory Visit: Payer: 59 | Admitting: Internal Medicine

## 2020-08-14 ENCOUNTER — Encounter: Payer: Self-pay | Admitting: Pulmonary Disease

## 2020-08-14 ENCOUNTER — Ambulatory Visit: Payer: 59 | Admitting: Pulmonary Disease

## 2020-08-14 ENCOUNTER — Other Ambulatory Visit: Payer: Self-pay

## 2020-08-14 VITALS — BP 108/72 | HR 78 | Temp 98.0°F | Ht 64.0 in | Wt 123.0 lb

## 2020-08-14 DIAGNOSIS — J432 Centrilobular emphysema: Secondary | ICD-10-CM | POA: Diagnosis not present

## 2020-08-14 NOTE — Patient Instructions (Signed)
Follow-up in one year.
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
## 2020-08-24 MED FILL — TYMLOS 3120 MCG/1.56ML SOPN: 3120 | 30 days supply | Qty: 2 | Fill #1

## 2020-08-31 ENCOUNTER — Other Ambulatory Visit (HOSPITAL_COMMUNITY): Payer: Self-pay | Admitting: Orthopedic Surgery

## 2020-08-31 DIAGNOSIS — M1711 Unilateral primary osteoarthritis, right knee: Secondary | ICD-10-CM | POA: Diagnosis not present

## 2020-08-31 DIAGNOSIS — M94261 Chondromalacia, right knee: Secondary | ICD-10-CM | POA: Diagnosis not present

## 2020-08-31 DIAGNOSIS — G8918 Other acute postprocedural pain: Secondary | ICD-10-CM | POA: Diagnosis not present

## 2020-08-31 DIAGNOSIS — S83281A Other tear of lateral meniscus, current injury, right knee, initial encounter: Secondary | ICD-10-CM | POA: Diagnosis not present

## 2020-08-31 HISTORY — PX: KNEE ARTHROSCOPY: SUR90

## 2020-09-01 ENCOUNTER — Other Ambulatory Visit (HOSPITAL_COMMUNITY): Payer: Self-pay | Admitting: Specialist

## 2020-09-03 ENCOUNTER — Ambulatory Visit: Payer: 59 | Admitting: Internal Medicine

## 2020-09-11 ENCOUNTER — Other Ambulatory Visit (HOSPITAL_COMMUNITY): Payer: Self-pay

## 2020-09-17 MED FILL — TYMLOS 3120 MCG/1.56ML SOPN: 3120 | 30 days supply | Qty: 2 | Fill #2

## 2020-09-21 ENCOUNTER — Other Ambulatory Visit: Payer: Self-pay | Admitting: Internal Medicine

## 2020-09-21 ENCOUNTER — Other Ambulatory Visit (HOSPITAL_COMMUNITY): Payer: Self-pay

## 2020-09-21 MED ORDER — OMEPRAZOLE 40 MG PO CPDR
DELAYED_RELEASE_CAPSULE | ORAL | 6 refills | Status: DC
  Filled 2020-09-21 – 2020-09-28 (×3): qty 60, 30d supply, fill #0
  Filled 2020-11-05: qty 60, 30d supply, fill #1
  Filled 2020-12-18: qty 60, 30d supply, fill #2
  Filled 2021-02-04: qty 60, 30d supply, fill #3
  Filled 2021-03-22: qty 60, 30d supply, fill #4
  Filled 2021-05-12: qty 60, 30d supply, fill #5
  Filled 2021-06-29: qty 60, 30d supply, fill #6

## 2020-09-22 ENCOUNTER — Other Ambulatory Visit (HOSPITAL_COMMUNITY): Payer: Self-pay

## 2020-09-24 ENCOUNTER — Other Ambulatory Visit (HOSPITAL_COMMUNITY): Payer: Self-pay

## 2020-09-28 ENCOUNTER — Other Ambulatory Visit (HOSPITAL_COMMUNITY): Payer: Self-pay

## 2020-09-30 NOTE — Progress Notes (Signed)
Office Visit Note  Patient: Robin Rivera             Date of Birth: 19-Jun-1956           MRN: [id number]             PCP: Janith Lima, MD Referring: No ref. provider found Visit Date: 10/14/2020 Occupation: @GUAROCC @  Subjective:  Medication monitoring.   History of Present Illness: Robin Rivera is a 65 y.o. female with a history of osteoporosis.  She has been on Tymlos since June 2020 but she stopped it after taking for 1 year.  She had a gap of 4 months and then she restarted Tymlos.  She is currently on Tymlos.  She will be finishing Tymlos in October 2021.  She has been tolerating the medication well.  She has been also taking calcium and vitamin D.  She denies any side effects from the medication.  Activities of Daily Living:  Patient reports morning stiffness for 0 minutes.   Patient Reports nocturnal pain.  Difficulty dressing/grooming: Denies Difficulty climbing stairs: Denies Difficulty getting out of chair: Denies Difficulty using hands for taps, buttons, cutlery, and/or writing: Denies  Review of Systems  Constitutional: Negative for fatigue.  HENT: Negative for mouth sores, mouth dryness and nose dryness.   Eyes: Negative for pain, itching and dryness.  Respiratory: Negative for shortness of breath and difficulty breathing.   Cardiovascular: Negative for chest pain and palpitations.  Gastrointestinal: Negative for blood in stool, constipation and diarrhea.  Endocrine: Negative for increased urination.  Genitourinary: Negative for difficulty urinating.  Musculoskeletal: Negative for arthralgias, joint pain, joint swelling, myalgias, morning stiffness, muscle tenderness and myalgias.  Skin: Negative for color change, rash and redness.  Allergic/Immunologic: Negative for susceptible to infections.  Neurological: Negative for dizziness, numbness, headaches, memory loss and weakness.  Hematological: Positive for bruising/bleeding tendency.   Psychiatric/Behavioral: Negative for confusion.    PMFS History:  Patient Active Problem List   Diagnosis Date Noted  . Sternal fracture 06/26/2020  . Abnormal CT of the chest 04/03/2019  . Monoclonal gammopathy 10/22/2016  . Age-related osteoporosis without current pathological fracture 09/22/2016  . Vitamin D deficiency 09/22/2016  . Routine general medical examination at a health care facility 05/02/2016  . Underweight 05/02/2016  . GERD (gastroesophageal reflux disease) 03/29/2016  . Eye irritation 02/15/2016  . Dysphagia 02/04/2016  . Atypical chest pain 02/04/2016  . Coronary artery calcification seen on CAT scan 02/04/2016  . Chronic obstructive pulmonary disease ([place]) 08/16/2013  . TOBACCO ABUSE 01/09/2009    Past Medical History:  Diagnosis Date  . COPD (chronic obstructive pulmonary disease) ([place])   . Esophageal stricture   . GERD (gastroesophageal reflux disease)   . Monoclonal gammopathy    unknown significance, followed at [facility].   . Osteoporosis   . Rhinitis   . Tobacco abuse     Family History  Adopted: Yes  Problem Relation Age of Onset  . COPD Mother   . Kidney disease Mother   . Other Father        his hx is unknown  . Colon cancer Maternal Aunt   . Healthy Son   . Healthy Son   . Healthy Son    Past Surgical History:  Procedure Laterality Date  . BREAST BIOPSY    . BREAST EXCISIONAL BIOPSY Right   . BREAST SURGERY Bilateral    lumpectomy   . CESAREAN SECTION    . COLONOSCOPY    .  ESOPHAGOGASTRODUODENOSCOPY    . KNEE ARTHROSCOPY  08/31/2020  . TONSILLECTOMY    . TOTAL ABDOMINAL HYSTERECTOMY     Partial hysterectomy   Social History   Social History Narrative   Fun: Play games on her computer, bon fires   Denies abuse and feels safe at home.    Immunization History  Administered Date(s) Administered  . Fluad Quad(high Dose 65+) 03/25/2020  . Influenza Split 04/12/2011, 04/15/2013, 03/25/2014, 03/22/2016  .  Influenza,inj,Quad PF,6+ Mos 03/21/2015  . Pneumococcal Polysaccharide-23 02/15/2016  . Tdap 05/02/2016     Objective: Vital Signs: BP 113/71 (BP Location: Left Arm, Patient Position: Sitting, Cuff Size: Normal)   Pulse 67   Resp 13   Ht 5' 4.5" (1.638 m)   Wt 122 lb 3.2 oz (55.4 kg)   BMI 20.65 kg/m    Physical Exam Vitals and nursing note reviewed.  Constitutional:      Appearance: She is well-developed.  HENT:     Head: Normocephalic and atraumatic.  Eyes:     Conjunctiva/sclera: Conjunctivae normal.  Cardiovascular:     Rate and Rhythm: Normal rate and regular rhythm.     Heart sounds: Normal heart sounds.  Pulmonary:     Effort: Pulmonary effort is normal.     Breath sounds: Normal breath sounds.  Abdominal:     General: Bowel sounds are normal.     Palpations: Abdomen is soft.  Musculoskeletal:     Cervical back: Normal range of motion.  Lymphadenopathy:     Cervical: No cervical adenopathy.  Skin:    General: Skin is warm and dry.     Capillary Refill: Capillary refill takes less than 2 seconds.  Neurological:     Mental Status: She is alert and oriented to person, place, and time.  Psychiatric:        Behavior: Behavior normal.      Musculoskeletal Exam: C-spine, thoracic and lumbar spine with good range of motion.  Shoulder joints, elbow joints, wrist joints, MCPs PIPs and DIPs with good range of motion without synovitis.  Hip joints, knee joints, ankles, MTPs and PIPs with good range of motion with no synovitis.  CDAI Exam: CDAI Score: -- Patient Global: --; Provider Global: -- Swollen: --; Tender: -- Joint Exam 10/14/2020   No joint exam has been documented for this visit   There is currently no information documented on the homunculus. Go to the Rheumatology activity and complete the homunculus joint exam.  Investigation: No additional findings.  Imaging: No results found.  Recent Labs: Lab Results  Component Value Date   WBC 5.5  09/17/2019   HGB 13.0 09/17/2019   PLT 322 09/17/2019   NA 139 04/15/2020   K 3.5 04/15/2020   CL 103 04/15/2020   CO2 29 04/15/2020   GLUCOSE 107 (H) 04/15/2020   BUN 12 04/15/2020   CREATININE 0.91 04/15/2020   BILITOT 0.3 04/15/2020   ALKPHOS 105 10/27/2016   AST 11 04/15/2020   ALT 8 04/15/2020   PROT 6.9 04/15/2020   ALBUMIN 4.0 10/27/2016   CALCIUM 9.4 04/15/2020   GFRAA 77 04/15/2020    Speciality Comments: Reclastx 3years2017-2020  Tymlos 11/2018-11/2019 03/2020- current  Procedures:  No procedures performed Allergies: Patient has no known allergies.   Assessment / Plan:     Visit Diagnoses: Age-related osteoporosis without current pathological fracture - DEXA results on 12/14/18: BMD measured at femur total right is 0.608 with T-score of -3.2.  We will request DEXA after December 13, 2020 through her PCP.  It will help [place] determine future treatment.  She was on Reclast from 2017 till 2020.  She started [facility] in June  2020 and took it for 1 year.  She had a gap of 35-month then restart [facility] October 2021.  She has been tolerating Tymlos well.  Use of calcium, vitamin D and resistive exercises were discussed.  Medication monitoring encounter - Tymlos 3210 mcg/1.56 ml sq daily injections.  Vitamin D deficiency - 2000U daily.  She has history of vitamin D deficiency.  We will check vitamin D level today.  Coronary artery calcification  Monoclonal gammopathy-patient states that she has not seen an oncologist in the last 2 years.  She is to see somebody at [facility].  She requested a referral to an oncologist in [place].  History of gastroesophageal reflux (GERD)  History of COPD  Former smoker  Orders: Orders Placed This Encounter  Procedures  . CBC with Differential/Platelet  . COMPLETE METABOLIC PANEL WITH GFR  . VITAMIN D 25 Hydroxy (Vit-D Deficiency, Fractures)  . Ambulatory referral to Hematology / Oncology   Meds ordered this encounter  Medications  .  Abaloparatide 3120 MCG/1.56ML SOPN    Sig: INJECT 80 MCG INTO THE SKIN DAILY.    Dispense:  1.56 mL    Refill:  5     Follow-Up Instructions: Return in about 5 months (around 03/16/2021) for Osteoporosis.   Bo Merino, MD  Note - This record has been created using Editor, commissioning.  Chart creation errors have been sought, but may not always  have been located. Such creation errors do not reflect on  the standard of medical care.

## 2020-10-14 ENCOUNTER — Other Ambulatory Visit (HOSPITAL_COMMUNITY): Payer: Self-pay

## 2020-10-14 ENCOUNTER — Other Ambulatory Visit: Payer: Self-pay | Admitting: Rheumatology

## 2020-10-14 ENCOUNTER — Ambulatory Visit: Payer: 59 | Admitting: Rheumatology

## 2020-10-14 ENCOUNTER — Encounter: Payer: Self-pay | Admitting: Rheumatology

## 2020-10-14 ENCOUNTER — Other Ambulatory Visit: Payer: Self-pay

## 2020-10-14 VITALS — BP 113/71 | HR 67 | Resp 13 | Ht 64.5 in | Wt 122.2 lb

## 2020-10-14 DIAGNOSIS — E559 Vitamin D deficiency, unspecified: Secondary | ICD-10-CM

## 2020-10-14 DIAGNOSIS — I2510 Atherosclerotic heart disease of native coronary artery without angina pectoris: Secondary | ICD-10-CM

## 2020-10-14 DIAGNOSIS — Z8639 Personal history of other endocrine, nutritional and metabolic disease: Secondary | ICD-10-CM

## 2020-10-14 DIAGNOSIS — M810 Age-related osteoporosis without current pathological fracture: Secondary | ICD-10-CM | POA: Diagnosis not present

## 2020-10-14 DIAGNOSIS — Z8709 Personal history of other diseases of the respiratory system: Secondary | ICD-10-CM

## 2020-10-14 DIAGNOSIS — Z8719 Personal history of other diseases of the digestive system: Secondary | ICD-10-CM

## 2020-10-14 DIAGNOSIS — D472 Monoclonal gammopathy: Secondary | ICD-10-CM | POA: Diagnosis not present

## 2020-10-14 DIAGNOSIS — Z5181 Encounter for therapeutic drug level monitoring: Secondary | ICD-10-CM

## 2020-10-14 DIAGNOSIS — Z87891 Personal history of nicotine dependence: Secondary | ICD-10-CM | POA: Diagnosis not present

## 2020-10-14 DIAGNOSIS — M791 Myalgia, unspecified site: Secondary | ICD-10-CM

## 2020-10-14 DIAGNOSIS — T1490XD Injury, unspecified, subsequent encounter: Secondary | ICD-10-CM

## 2020-10-14 DIAGNOSIS — I2584 Coronary atherosclerosis due to calcified coronary lesion: Secondary | ICD-10-CM

## 2020-10-14 MED ORDER — ABALOPARATIDE 3120 MCG/1.56ML ~~LOC~~ SOPN
PEN_INJECTOR | SUBCUTANEOUS | 5 refills | Status: DC
  Filled 2020-10-14: qty 1.56, fill #0

## 2020-10-15 ENCOUNTER — Other Ambulatory Visit: Payer: Self-pay | Admitting: Pharmacist

## 2020-10-15 ENCOUNTER — Other Ambulatory Visit (HOSPITAL_COMMUNITY): Payer: Self-pay

## 2020-10-15 ENCOUNTER — Other Ambulatory Visit: Payer: Self-pay | Admitting: Obstetrics and Gynecology

## 2020-10-15 ENCOUNTER — Telehealth: Payer: Self-pay | Admitting: Obstetrics and Gynecology

## 2020-10-15 DIAGNOSIS — M810 Age-related osteoporosis without current pathological fracture: Secondary | ICD-10-CM

## 2020-10-15 LAB — COMPLETE METABOLIC PANEL WITH GFR
AG Ratio: 1.6 (calc) (ref 1.0–2.5)
ALT: 8 U/L (ref 6–29)
AST: 11 U/L (ref 10–35)
Albumin: 4.3 g/dL (ref 3.6–5.1)
Alkaline phosphatase (APISO): 97 U/L (ref 37–153)
BUN/Creatinine Ratio: 13 (calc) (ref 6–22)
BUN: 17 mg/dL (ref 7–25)
CO2: 32 mmol/L (ref 20–32)
Calcium: 9.9 mg/dL (ref 8.6–10.4)
Chloride: 100 mmol/L (ref 98–110)
Creat: 1.27 mg/dL — ABNORMAL HIGH (ref 0.50–0.99)
GFR, Est African American: 52 mL/min/{1.73_m2} — ABNORMAL LOW (ref >=60)
GFR, Est Non African American: 45 mL/min/{1.73_m2} — ABNORMAL LOW (ref >=60)
Globulin: 2.7 g/dL (calc) (ref 1.9–3.7)
Glucose, Bld: 92 mg/dL (ref 65–99)
Potassium: 4.8 mmol/L (ref 3.5–5.3)
Sodium: 138 mmol/L (ref 135–146)
Total Bilirubin: 0.2 mg/dL (ref 0.2–1.2)
Total Protein: 7 g/dL (ref 6.1–8.1)

## 2020-10-15 LAB — CBC WITH DIFFERENTIAL/PLATELET
Absolute Monocytes: 382 cells/uL (ref 200–950)
Basophils Absolute: 103 cells/uL (ref 0–200)
Basophils Relative: 1.8 %
Eosinophils Absolute: 148 cells/uL (ref 15–500)
Eosinophils Relative: 2.6 %
HCT: 41.3 % (ref 35.0–45.0)
Hemoglobin: 13.3 g/dL (ref 11.7–15.5)
Lymphs Abs: 1801 cells/uL (ref 850–3900)
MCH: 29 pg (ref 27.0–33.0)
MCHC: 32.2 g/dL (ref 32.0–36.0)
MCV: 90 fL (ref 80.0–100.0)
MPV: 10 fL (ref 7.5–12.5)
Monocytes Relative: 6.7 %
Neutro Abs: 3266 cells/uL (ref 1500–7800)
Neutrophils Relative %: 57.3 %
Platelets: 323 10*3/uL (ref 140–400)
RBC: 4.59 10*6/uL (ref 3.80–5.10)
RDW: 13.6 % (ref 11.0–15.0)
Total Lymphocyte: 31.6 %
WBC: 5.7 10*3/uL (ref 3.8–10.8)

## 2020-10-15 LAB — VITAMIN D 25 HYDROXY (VIT D DEFICIENCY, FRACTURES): Vit D, 25-Hydroxy: 26 ng/mL — ABNORMAL LOW (ref 30–100)

## 2020-10-15 MED ORDER — ABALOPARATIDE 3120 MCG/1.56ML ~~LOC~~ SOPN
PEN_INJECTOR | SUBCUTANEOUS | 5 refills | Status: AC
  Filled 2020-10-15: qty 1.56, fill #0
  Filled 2020-10-15: qty 1.56, 30d supply, fill #0
  Filled 2020-11-19: qty 1.56, 30d supply, fill #1
  Filled 2020-12-14 – 2020-12-16 (×2): qty 1.56, 30d supply, fill #2
  Filled 2021-01-08: qty 1.56, 30d supply, fill #3
  Filled 2021-02-24: qty 1.56, 30d supply, fill #4
  Filled 2021-03-22: qty 1.56, 30d supply, fill #5

## 2020-10-15 NOTE — Telephone Encounter (Signed)
mychart message sent that DEXA has been ordered.

## 2020-10-15 NOTE — Progress Notes (Signed)
DEXA ordered.  

## 2020-10-16 ENCOUNTER — Other Ambulatory Visit: Payer: Self-pay | Admitting: *Deleted

## 2020-10-16 ENCOUNTER — Other Ambulatory Visit (HOSPITAL_COMMUNITY): Payer: Self-pay

## 2020-10-16 ENCOUNTER — Telehealth: Payer: Self-pay | Admitting: Hematology and Oncology

## 2020-10-16 ENCOUNTER — Encounter: Payer: Self-pay | Admitting: Rheumatology

## 2020-10-16 DIAGNOSIS — E559 Vitamin D deficiency, unspecified: Secondary | ICD-10-CM

## 2020-10-16 DIAGNOSIS — M810 Age-related osteoporosis without current pathological fracture: Secondary | ICD-10-CM

## 2020-10-16 MED ORDER — PEN NEEDLES 31G X 5 MM MISC
1 refills | Status: DC
  Filled 2020-10-16: qty 100, 90d supply, fill #0
  Filled 2021-01-25: qty 100, 90d supply, fill #1

## 2020-10-16 NOTE — Telephone Encounter (Signed)
Received a new hem referral from Dr. Estanislado Pandy for monoclonal gammopathy. Pt needed an appt around 3pm due to her work schedule. She has been scheduled to see Dr. Lindi Adie on 5/11 at 3pm. Pt aware to arrive 15 minutes early.

## 2020-10-16 NOTE — Telephone Encounter (Signed)
Pen needles sent to [facility]. Patient should be completed Tymlos therapy in October 2022.  Knox Saliva, PharmD, MPH Clinical Pharmacist (Rheumatology and Pulmonology)

## 2020-10-17 ENCOUNTER — Other Ambulatory Visit (HOSPITAL_COMMUNITY): Payer: Self-pay

## 2020-10-17 MED ORDER — VITAMIN D (ERGOCALCIFEROL) 1.25 MG (50000 UNIT) PO CAPS
50000.0000 [IU] | ORAL_CAPSULE | ORAL | 0 refills | Status: AC
  Filled 2020-10-17: qty 12, 84d supply, fill #0

## 2020-10-19 ENCOUNTER — Other Ambulatory Visit (HOSPITAL_COMMUNITY): Payer: Self-pay

## 2020-10-20 ENCOUNTER — Other Ambulatory Visit (HOSPITAL_COMMUNITY): Payer: Self-pay

## 2020-10-21 ENCOUNTER — Other Ambulatory Visit (HOSPITAL_COMMUNITY): Payer: Self-pay

## 2020-10-27 NOTE — Progress Notes (Signed)
[facility] NOTE  Patient Care Team: Janith Lima, MD as PCP - General (Internal Medicine)  CHIEF COMPLAINTS/PURPOSE OF CONSULTATION:  Newly diagnosed monoclonal gammopathy of unknown significance  HISTORY OF PRESENTING ILLNESS:  Robin Rivera 65 y.o. female is here because of recent diagnosis of monoclonal gammopathy of unknown significance. She is referred by Dr. Estanislado Pandy. She had seen hematology in [place].  She apparently had an elevated M protein which then went away and therefore the recommendation was to follow-up as needed.  She was following with Dr. Patrecia Pour who wanted her to follow-up with [place].  She presents to the clinic today for initial evaluation.  She works for [employer].  She denies any bone pain or discomfort.  Couple weeks ago she had labs which showed a mild elevated creatinine.  I reviewed her records extensively and collaborated the history with the patient.  MEDICAL HISTORY:  Past Medical History:  Diagnosis Date  . COPD (chronic obstructive pulmonary disease) ([place])   . Esophageal stricture   . GERD (gastroesophageal reflux disease)   . Monoclonal gammopathy    unknown significance, followed at [facility].   . Osteoporosis   . Rhinitis   . Tobacco abuse     SURGICAL HISTORY: Past Surgical History:  Procedure Laterality Date  . BREAST BIOPSY    . BREAST EXCISIONAL BIOPSY Right   . BREAST SURGERY Bilateral    lumpectomy   . CESAREAN SECTION    . COLONOSCOPY    . ESOPHAGOGASTRODUODENOSCOPY    . KNEE ARTHROSCOPY  08/31/2020  . TONSILLECTOMY    . TOTAL ABDOMINAL HYSTERECTOMY     Partial hysterectomy    SOCIAL HISTORY: Social History   Socioeconomic History  . Marital status: Married    Spouse name: Not on file  . Number of children: 3  . Years of education: 67  . Highest education level: Not on file  Occupational History  . Occupation: Fish farm manager: [employer]   Tobacco Use  . Smoking status: Former Smoker    Packs/day: 0.75    Years: 46.00    Pack years: 34.50    Types: Cigarettes    Quit date: 12/2018    Years since quitting: 1.8  . Smokeless tobacco: Never Used  Vaping Use  . Vaping Use: Never used  Substance and Sexual Activity  . Alcohol use: No    Alcohol/week: 0.0 standard drinks  . Drug use: No  . Sexual activity: Yes    Partners: Male    Birth control/protection: Surgical  Other Topics Concern  . Not on file  Social History Narrative   Fun: Play games on her computer, bon fires   Denies abuse and feels safe at home.    Social Determinants of Health   Financial Resource Strain: Not on file  Food Insecurity: Not on file  Transportation Needs: Not on file  Physical Activity: Not on file  Stress: Not on file  Social Connections: Not on file  Intimate Partner Violence: Not on file    FAMILY HISTORY: Family History  Adopted: Yes  Problem Relation Age of Onset  . COPD Mother   . Kidney disease Mother   . Other Father        his hx is unknown  . Colon cancer Maternal Aunt   . Healthy Son   . Healthy Son   . Healthy Son     ALLERGIES:  has No Known  Allergies.  MEDICATIONS:  Current Outpatient Medications  Medication Sig Dispense Refill  . Abaloparatide 3120 MCG/1.56ML SOPN INJECT 80 MCG INTO THE SKIN DAILY. 1.56 mL 5  . albuterol (VENTOLIN HFA) 108 (90 Base) MCG/ACT inhaler Inhale 2 puffs into the lungs every 6 (six) hours as needed for wheezing or shortness of breath. 6.7 g 5  . Calcium Carb-Cholecalciferol (CALCIUM CARBONATE-VITAMIN D3) 600-400 MG-UNIT TABS Take 2 tablets by mouth daily.    [place] HYDROcodone-acetaminophen (NORCO/VICODIN) 5-325 MG tablet TAKE 1 TABLET BY MOUTH 4 TIMES A DAY AS NEEDED FOR PAIN FOR 7 DAYS. 28 tablet 0  . Insulin Pen Needle (PEN NEEDLES) 31G X 5 MM MISC Use to inject Tymlos once daily. Discard after use. 100 each 1  . omeprazole (PRILOSEC) 40 MG capsule TAKE 1 CAPSULE BY MOUTH IN THE  MORNING AND AT BEDTIME 60 capsule 6  . Vitamin D, Ergocalciferol, (DRISDOL) 1.25 MG (50000 UNIT) CAPS capsule Take 1 capsule by mouth every 7 days. 12 capsule 0   No current facility-administered medications for this visit.    REVIEW OF SYSTEMS:   Constitutional: Denies fevers, chills or abnormal night sweats Eyes: Denies blurriness of vision, double vision or watery eyes Ears, nose, mouth, throat, and face: Denies mucositis or sore throat Respiratory: Denies cough, dyspnea or wheezes Cardiovascular: Denies palpitation, chest discomfort or lower extremity swelling Gastrointestinal:  Denies nausea, heartburn or change in bowel habits Skin: Denies abnormal skin rashes Lymphatics: Denies new lymphadenopathy or easy bruising Neurological:Denies numbness, tingling or new weaknesses Behavioral/Psych: Mood is stable, no new changes    All other systems were reviewed with the patient and are negative.  PHYSICAL EXAMINATION: ECOG PERFORMANCE STATUS: 0 - Asymptomatic  Vitals:   10/28/20 1507  BP: 121/69  Pulse: 77  Resp: 16  Temp: 98.1 F (36.7 C)  SpO2: 98%   Filed Weights   10/28/20 1507  Weight: 122 lb 6.4 oz (55.5 kg)    GENERAL:alert, no distress and comfortable SKIN: skin color, texture, turgor are normal, no rashes or significant lesions EYES: normal, conjunctiva are pink and non-injected, sclera clear OROPHARYNX:no exudate, no erythema and lips, buccal mucosa, and tongue normal  NECK: supple, thyroid normal size, non-tender, without nodularity LYMPH:  no palpable lymphadenopathy in the cervical, axillary or inguinal LUNGS: clear to auscultation and percussion with normal breathing effort HEART: regular rate & rhythm and no murmurs and no lower extremity edema ABDOMEN:abdomen soft, non-tender and normal bowel sounds Musculoskeletal:no cyanosis of digits and no clubbing  PSYCH: alert & oriented x 3 with fluent speech NEURO: no focal motor/sensory deficits  LABORATORY  DATA:  I have reviewed the data as listed Lab Results  Component Value Date   WBC 5.7 10/14/2020   HGB 13.3 10/14/2020   HCT 41.3 10/14/2020   MCV 90.0 10/14/2020   PLT 323 10/14/2020   Lab Results  Component Value Date   NA 138 10/14/2020   K 4.8 10/14/2020   CL 100 10/14/2020   CO2 32 10/14/2020    RADIOGRAPHIC STUDIES: I have personally reviewed the radiological reports and agreed with the findings in the report.  ASSESSMENT AND PLAN:  Monoclonal gammopathy 09/26/2016: M protein 0.2 g  Counseling: I discussed with the patient the spectrum of disorders from MGUS to multiple myeloma. We discussed the role of plasma cells in producing immunoglobulins. We discussed structure of immunoglobulins on how they make up the heavy chains and the light chains. The light chains are Kappa and lambda. I discussed the difference  between MGUS and multiple myeloma. MGUS is characterized by elevation monoclonal protein without any end organ damage. Multiple myeloma is associated with elevation monoclonal protein and end organ damage (hypercalcemia, renal dysfunction, anemia, bone lytic lesions ) along with a bone marrow showing greater than 10% plasma cells.  Workup recommended: 1. CBC with differential and CMP 2. serum protein electrophoresis with immunofixation, kappa lambda ratio  Telephone visit in 1 week to discuss results.    All questions were answered. The patient knows to call the clinic with any problems, questions or concerns.   Rulon Eisenmenger, MD, MPH 10/28/2020    I, Molly Dorshimer, am acting as scribe for Nicholas Lose, MD.  I have reviewed the above documentation for accuracy and completeness, and I agree with the above.

## 2020-10-28 ENCOUNTER — Other Ambulatory Visit: Payer: Self-pay

## 2020-10-28 ENCOUNTER — Inpatient Hospital Stay: Payer: Medicare Other

## 2020-10-28 ENCOUNTER — Inpatient Hospital Stay: Payer: Medicare Other | Attending: Hematology and Oncology | Admitting: Hematology and Oncology

## 2020-10-28 VITALS — BP 121/69 | HR 77 | Temp 98.1°F | Resp 16 | Ht 64.5 in | Wt 122.4 lb

## 2020-10-28 DIAGNOSIS — D649 Anemia, unspecified: Secondary | ICD-10-CM | POA: Insufficient documentation

## 2020-10-28 DIAGNOSIS — D472 Monoclonal gammopathy: Secondary | ICD-10-CM | POA: Diagnosis not present

## 2020-10-28 DIAGNOSIS — Z79899 Other long term (current) drug therapy: Secondary | ICD-10-CM | POA: Diagnosis not present

## 2020-10-28 DIAGNOSIS — C9000 Multiple myeloma not having achieved remission: Secondary | ICD-10-CM | POA: Insufficient documentation

## 2020-10-28 LAB — CBC WITH DIFFERENTIAL (CANCER CENTER ONLY)
Abs Immature Granulocytes: 0.01 10*3/uL (ref 0.00–0.07)
Basophils Absolute: 0.1 10*3/uL (ref 0.0–0.1)
Basophils Relative: 2 %
Eosinophils Absolute: 0.1 10*3/uL (ref 0.0–0.5)
Eosinophils Relative: 3 %
HCT: 38.6 % (ref 36.0–46.0)
Hemoglobin: 12.5 g/dL (ref 12.0–15.0)
Immature Granulocytes: 0 %
Lymphocytes Relative: 31 %
Lymphs Abs: 1.6 10*3/uL (ref 0.7–4.0)
MCH: 29.3 pg (ref 26.0–34.0)
MCHC: 32.4 g/dL (ref 30.0–36.0)
MCV: 90.6 fL (ref 80.0–100.0)
Monocytes Absolute: 0.4 10*3/uL (ref 0.1–1.0)
Monocytes Relative: 7 %
Neutro Abs: 2.9 10*3/uL (ref 1.7–7.7)
Neutrophils Relative %: 57 %
Platelet Count: 301 10*3/uL (ref 150–400)
RBC: 4.26 MIL/uL (ref 3.87–5.11)
RDW: 14.2 % (ref 11.5–15.5)
WBC Count: 5.1 10*3/uL (ref 4.0–10.5)
nRBC: 0 % (ref 0.0–0.2)

## 2020-10-28 LAB — CMP (CANCER CENTER ONLY)
ALT: 9 U/L (ref 0–44)
AST: 12 U/L — ABNORMAL LOW (ref 15–41)
Albumin: 4 g/dL (ref 3.5–5.0)
Alkaline Phosphatase: 96 U/L (ref 38–126)
Anion gap: 9 (ref 5–15)
BUN: 11 mg/dL (ref 8–23)
CO2: 30 mmol/L (ref 22–32)
Calcium: 9.9 mg/dL (ref 8.9–10.3)
Chloride: 102 mmol/L (ref 98–111)
Creatinine: 1.17 mg/dL — ABNORMAL HIGH (ref 0.44–1.00)
GFR, Estimated: 52 mL/min — ABNORMAL LOW (ref >60)
Glucose, Bld: 109 mg/dL — ABNORMAL HIGH (ref 70–99)
Potassium: 3.8 mmol/L (ref 3.5–5.1)
Sodium: 141 mmol/L (ref 135–145)
Total Bilirubin: <0.2 mg/dL — ABNORMAL LOW (ref 0.3–1.2)
Total Protein: 7.3 g/dL (ref 6.5–8.1)

## 2020-10-28 NOTE — Assessment & Plan Note (Signed)
09/26/2016: M protein 0.2 g  Counseling: I discussed with the patient the spectrum of disorders from MGUS to multiple myeloma. We discussed the role of plasma cells in producing immunoglobulins. We discussed structure of immunoglobulins on how they make up the heavy chains and the light chains. The light chains are Kappa and lambda. I discussed the difference between MGUS and multiple myeloma. MGUS is characterized by elevation monoclonal protein without any end organ damage. Multiple myeloma is associated with elevation monoclonal protein and end organ damage (hypercalcemia, renal dysfunction, anemia, bone lytic lesions ) along with a bone marrow showing greater than 10% plasma cells.  Workup recommended: 1. CBC with differential and CMP 2. serum protein electrophoresis with immunofixation, kappa lambda ratio  Telephone visit in 1 week to discuss results.

## 2020-10-29 ENCOUNTER — Telehealth: Payer: Self-pay | Admitting: Hematology and Oncology

## 2020-10-29 LAB — KAPPA/LAMBDA LIGHT CHAINS
Kappa free light chain: 29.6 mg/L — ABNORMAL HIGH (ref 3.3–19.4)
Kappa, lambda light chain ratio: 1.19 (ref 0.26–1.65)
Lambda free light chains: 24.9 mg/L (ref 5.7–26.3)

## 2020-10-29 NOTE — Telephone Encounter (Signed)
Sch per 5/11 los, pt aware 

## 2020-11-02 ENCOUNTER — Telehealth: Payer: Medicare Other | Admitting: Emergency Medicine

## 2020-11-02 ENCOUNTER — Other Ambulatory Visit (HOSPITAL_COMMUNITY): Payer: Self-pay

## 2020-11-02 DIAGNOSIS — J069 Acute upper respiratory infection, unspecified: Secondary | ICD-10-CM

## 2020-11-02 DIAGNOSIS — H538 Other visual disturbances: Secondary | ICD-10-CM

## 2020-11-02 LAB — MULTIPLE MYELOMA PANEL, SERUM
Albumin SerPl Elph-Mcnc: 3.9 g/dL (ref 2.9–4.4)
Albumin/Glob SerPl: 1.4 (ref 0.7–1.7)
Alpha 1: 0.2 g/dL (ref 0.0–0.4)
Alpha2 Glob SerPl Elph-Mcnc: 0.7 g/dL (ref 0.4–1.0)
B-Globulin SerPl Elph-Mcnc: 1 g/dL (ref 0.7–1.3)
Gamma Glob SerPl Elph-Mcnc: 1 g/dL (ref 0.4–1.8)
Globulin, Total: 3 g/dL (ref 2.2–3.9)
IgA: 244 mg/dL (ref 87–352)
IgG (Immunoglobin G), Serum: 998 mg/dL (ref 586–1602)
IgM (Immunoglobulin M), Srm: 150 mg/dL (ref 26–217)
M Protein SerPl Elph-Mcnc: 0.3 g/dL — ABNORMAL HIGH
Total Protein ELP: 6.9 g/dL (ref 6.0–8.5)

## 2020-11-02 MED ORDER — AMOXICILLIN-POT CLAVULANATE 875-125 MG PO TABS
1.0000 | ORAL_TABLET | Freq: Two times a day (BID) | ORAL | 0 refills | Status: DC
  Filled 2020-11-02: qty 14, 7d supply, fill #0

## 2020-11-02 NOTE — Progress Notes (Signed)
Based on what you shared with me, trouble seeing/blurry vision and wheeze, I feel your condition warrants further evaluation and I recommend that you be seen in a face to face office visit where a full exam can be performed to help determine the cause of your symptoms so the best treatment options can be given to you.   NOTE: If you entered your credit card information for this eVisit, you will not be charged. You may see a "hold" on your card for the $35 but that hold will drop off and you will not have a charge processed.   If you are having a true medical emergency please call 911.      For an urgent face to face visit, [facility] has six urgent care centers for your convenience:     [facility] [place] [facility] [place] Get Driving Directions [phone] [address] [place], [place] [address] . 8 am - 4 pm Monday - Friday    [place] [facility]) Get Driving Directions [phone] [address] [place], [place] [address] . 8 am to 8 pm Monday-Friday . 10 am to 6 pm [facility]) Get Driving Directions [phone]  [address] [place],  [place]  [address] . 8 am to 8 pm Monday-Friday . 8 am to 4 pm [facility] Get Driving Directions [phone] [place], [place], [place] [address] . 8 am to 8 pm Monday-Friday . 8 am to 4 pm [facility] at [facility] Get Driving Directions  [phone] [address] [place], [place] [address] . 8 am to 8 pm Monday-Friday . 8 am to 4 pm [facility] at [place] Get Driving Directions [phone] [address] [place], [place] [address] . 8 am to 8 pm Monday-Friday . 8 am to 4 pm Saturday-Sunday     Your MyChart E-visit questionnaire answers were  reviewed by a board certified advanced clinical practitioner to complete your personal care plan based on your specific symptoms.  Thank you for using e-Visits.    Approximately 5 minutes was spent documenting and reviewing patient's chart.

## 2020-11-02 NOTE — Telephone Encounter (Signed)
VS please advise on recs-  Pt c/o sinus congestion, pnd, prod cough with yellow/green mucus, fatigue X3-4 days. Denies fever, chest pain/tightness. Has been taking mucinex and using albuterol to help with s/s.  Requesting an abx. Thanks!

## 2020-11-05 NOTE — Assessment & Plan Note (Signed)
09/26/2016: M protein 0.2 g 10/28/20: M-Protein 0.3 gm, IgM Lambda, Kappa: 29.6, Lambda 24.9, ratio 1.19, CBCD and CMP Nrmal except for creatinine 1.17  Labs are stable RTC in 1 year with labs done ahead of time

## 2020-11-05 NOTE — Progress Notes (Signed)
  HEMATOLOGY-ONCOLOGY TELEPHONE VISIT PROGRESS NOTE  I connected with Robin Rivera on 11/06/2020 at 12:00 PM EDT by telephone and verified that I am speaking with the correct person using two identifiers.  I discussed the limitations, risks, security and privacy concerns of performing an evaluation and management service by telephone and the availability of in person appointments.  I also discussed with the patient that there may be a patient responsible charge related to this service. The patient expressed understanding and agreed to proceed.   History of Present Illness: Robin Rivera is a 65 y.o. female with above-mentioned history of monoclonal gammopathy of unknown significance. Labs on 10/28/20 showed: kappa/lambda light chain ratio 1.19, m-protein 0.3. She presents over the phone today for follow-up.  Reports no current health issues.  Observations/Objective:  Patient is doing quite well without any problems or concerns   Assessment Plan:  Monoclonal gammopathy 09/26/2016: M protein 0.2 g 10/28/20: M-Protein 0.3 gm, IgM Lambda, Kappa: 29.6, Lambda 24.9, ratio 1.19, CBCD and CMP Nrmal except for creatinine 1.17  Labs are stable RTC in 1 year with labs done ahead of time    I discussed the assessment and treatment plan with the patient. The patient was provided an opportunity to ask questions and all were answered. The patient agreed with the plan and demonstrated an understanding of the instructions. The patient was advised to call back or seek an in-person evaluation if the symptoms worsen or if the condition fails to improve as anticipated.   Total time spent: 11 mins including non-face to face time and time spent for planning, charting and coordination of care  Rulon Eisenmenger, MD 11/06/2020    I, Cloyde Reams Dorshimer, am acting as scribe for Nicholas Lose, MD.  I have reviewed the above documentation for accuracy and completeness, and I agree with the above.

## 2020-11-06 ENCOUNTER — Other Ambulatory Visit (HOSPITAL_COMMUNITY): Payer: Self-pay

## 2020-11-06 ENCOUNTER — Inpatient Hospital Stay (HOSPITAL_BASED_OUTPATIENT_CLINIC_OR_DEPARTMENT_OTHER): Payer: Medicare Other | Admitting: Hematology and Oncology

## 2020-11-06 DIAGNOSIS — D472 Monoclonal gammopathy: Secondary | ICD-10-CM

## 2020-11-09 ENCOUNTER — Encounter: Payer: Self-pay | Admitting: Internal Medicine

## 2020-11-09 ENCOUNTER — Telehealth: Payer: Self-pay | Admitting: Hematology and Oncology

## 2020-11-09 ENCOUNTER — Other Ambulatory Visit: Payer: Self-pay

## 2020-11-09 ENCOUNTER — Ambulatory Visit (INDEPENDENT_AMBULATORY_CARE_PROVIDER_SITE_OTHER): Payer: Medicare Other | Admitting: Internal Medicine

## 2020-11-09 ENCOUNTER — Other Ambulatory Visit (HOSPITAL_COMMUNITY): Payer: Self-pay

## 2020-11-09 VITALS — BP 104/66 | HR 78 | Temp 98.2°F | Resp 16 | Ht 64.5 in | Wt 122.0 lb

## 2020-11-09 DIAGNOSIS — E785 Hyperlipidemia, unspecified: Secondary | ICD-10-CM

## 2020-11-09 DIAGNOSIS — Z23 Encounter for immunization: Secondary | ICD-10-CM | POA: Diagnosis not present

## 2020-11-09 DIAGNOSIS — J300 Vasomotor rhinitis: Secondary | ICD-10-CM

## 2020-11-09 DIAGNOSIS — Z0001 Encounter for general adult medical examination with abnormal findings: Secondary | ICD-10-CM

## 2020-11-09 LAB — TSH: TSH: 0.46 u[IU]/mL (ref 0.35–4.50)

## 2020-11-09 MED ORDER — AZELASTINE HCL 0.1 % NA SOLN
1.0000 | Freq: Two times a day (BID) | NASAL | 1 refills | Status: DC
  Filled 2020-11-09: qty 60, 90d supply, fill #0

## 2020-11-09 NOTE — Telephone Encounter (Signed)
Scheduled appointment per 05/20 los. Patient is aware

## 2020-11-09 NOTE — Progress Notes (Signed)
Subjective:  Patient ID: Robin Rivera, female    DOB: [birth year]  Age: 65 y.o. MRN: [id number]  CC: Annual Exam and Hyperlipidemia  This visit occurred during the SARS-CoV-2 public health emergency.  Safety protocols were in place, including screening questions prior to the visit, additional usage of staff PPE, and extensive cleaning of exam room while observing appropriate contact time as indicated for disinfecting solutions.    HPI Robin Rivera presents for a CPX, f/up, and to establish.  She walks her dog several miles a day and does not experience CP, DOE, palpitations, edema, or fatigue.  She complains of a chronically runny nose.  She says the nasal phlegm is always clear.  She denies epistaxis, facial pain, or sore throat.  History Robin Rivera has a past medical history of COPD (chronic obstructive pulmonary disease) ([place]), Esophageal stricture, GERD (gastroesophageal reflux disease), Monoclonal gammopathy, Osteoporosis, Rhinitis, and Tobacco abuse.   She has a past surgical history that includes Tonsillectomy; Total abdominal hysterectomy; Cesarean section; Breast surgery (Bilateral); Breast biopsy; Breast excisional biopsy (Right); Colonoscopy; Esophagogastroduodenoscopy; and Knee arthroscopy (08/31/2020).   Her family history includes COPD in her mother; Colon cancer in her maternal aunt; Healthy in her son, son, and son; Kidney disease in her mother; Other in her father. She was adopted.She reports that she quit smoking about 22 months ago. Her smoking use included cigarettes. She has a 34.50 pack-year smoking history. She has never used smokeless tobacco. She reports that she does not drink alcohol and does not use drugs.  Outpatient Medications Prior to Visit  Medication Sig Dispense Refill  . Abaloparatide 3120 MCG/1.56ML SOPN INJECT 80 MCG INTO THE SKIN DAILY. 1.56 mL 5  . albuterol (VENTOLIN HFA) 108 (90 Base) MCG/ACT inhaler Inhale 2 puffs into the lungs every 6  (six) hours as needed for wheezing or shortness of breath. 6.7 g 5  . amoxicillin-clavulanate (AUGMENTIN) 875-125 MG tablet Take 1 tablet by mouth 2 (two) times daily. 14 tablet 0  . Calcium Carb-Cholecalciferol (CALCIUM CARBONATE-VITAMIN D3) 600-400 MG-UNIT TABS Take 2 tablets by mouth daily.    Marland Kitchen HYDROcodone-acetaminophen (NORCO/VICODIN) 5-325 MG tablet TAKE 1 TABLET BY MOUTH 4 TIMES A DAY AS NEEDED FOR PAIN FOR 7 DAYS. 28 tablet 0  . Insulin Pen Needle (PEN NEEDLES) 31G X 5 MM MISC Use to inject Tymlos once daily. Discard after use. 100 each 1  . omeprazole (PRILOSEC) 40 MG capsule TAKE 1 CAPSULE BY MOUTH IN THE MORNING AND AT BEDTIME 60 capsule 6  . Vitamin D, Ergocalciferol, (DRISDOL) 1.25 MG (50000 UNIT) CAPS capsule Take 1 capsule by mouth every 7 days. 12 capsule 0   No facility-administered medications prior to visit.    ROS Review of Systems  Constitutional: Negative for diaphoresis and fatigue.  HENT: Positive for postnasal drip and rhinorrhea. Negative for congestion, facial swelling, nosebleeds, sinus pressure, sinus pain, sore throat, trouble swallowing and voice change.   Eyes: Negative.   Respiratory: Negative for cough, chest tightness, shortness of breath and wheezing.   Cardiovascular: Negative for chest pain, palpitations and leg swelling.  Gastrointestinal: Negative for abdominal pain, constipation, diarrhea, nausea and vomiting.  Endocrine: Negative.   Genitourinary: Negative.  Negative for difficulty urinating.  Musculoskeletal: Negative for arthralgias and myalgias.  Skin: Negative.  Negative for color change.  Neurological: Negative.  Negative for dizziness, weakness, light-headedness and headaches.  Hematological: Negative for adenopathy. Does not bruise/bleed easily.  Psychiatric/Behavioral: Negative.     Objective:  BP 104/66 (BP Location:  Left Arm, Patient Position: Sitting, Cuff Size: Normal)   Pulse 78   Temp 98.2 F (36.8 C) (Oral)   Resp 16   Ht 5'  4.5" (1.638 m)   Wt 122 lb (55.3 kg)   SpO2 95%   BMI 20.62 kg/m   Physical Exam Vitals reviewed.  Constitutional:      Appearance: Normal appearance.  HENT:     Nose: Nose normal. No mucosal edema, congestion or rhinorrhea.     Right Nostril: No epistaxis.     Left Nostril: No epistaxis.     Mouth/Throat:     Mouth: Mucous membranes are moist.  Eyes:     General: No scleral icterus.    Conjunctiva/sclera: Conjunctivae normal.  Cardiovascular:     Rate and Rhythm: Normal rate and regular rhythm.     Heart sounds: No murmur heard.   Pulmonary:     Effort: Pulmonary effort is normal.     Breath sounds: No stridor. No wheezing, rhonchi or rales.  Abdominal:     General: Abdomen is flat.     Palpations: There is no mass.     Tenderness: There is no abdominal tenderness. There is no guarding.  Musculoskeletal:        General: Normal range of motion.     Cervical back: Neck supple.     Right lower leg: No edema.     Left lower leg: No edema.  Lymphadenopathy:     Cervical: No cervical adenopathy.  Skin:    General: Skin is warm and dry.  Neurological:     General: No focal deficit present.     Mental Status: She is alert.  Psychiatric:        Mood and Affect: Mood normal.        Behavior: Behavior normal.     Lab Results  Component Value Date   WBC 5.1 10/28/2020   HGB 12.5 10/28/2020   HCT 38.6 10/28/2020   PLT 301 10/28/2020   GLUCOSE 109 (H) 10/28/2020   CHOL 201 (H) 11/09/2020   TRIG 163.0 (H) 11/09/2020   HDL 57.10 11/09/2020   LDLCALC 111 (H) 11/09/2020   ALT 9 10/28/2020   AST 12 (L) 10/28/2020   NA 141 10/28/2020   K 3.8 10/28/2020   CL 102 10/28/2020   CREATININE 1.17 (H) 10/28/2020   BUN 11 10/28/2020   CO2 30 10/28/2020   TSH 0.46 11/09/2020    IMPRESSION: 1. Lung-RADS 2, benign appearance or behavior. Continue annual screening with low-dose chest CT without contrast in 12 months. 2. Stable dilated main pulmonary artery, suggesting  chronic pulmonary arterial hypertension. 3. One vessel coronary atherosclerosis. 4. Aortic Atherosclerosis (ICD10-I70.0) and Emphysema (ICD10-J43.9).   Electronically Signed   By: Ilona Sorrel M.D.   On: 03/20/2020 08:56   Assessment & Plan:   Robin Rivera was seen today for annual exam and hyperlipidemia.  Diagnoses and all orders for this visit:  Hyperlipidemia with target LDL less than 130- Her ASCVD risk score is 3.7% so I did not recommend a statin for CV risk reduction. -     TSH; Future -     TSH  Encounter for general adult medical examination with abnormal findings- Exam completed, labs reviewed, vaccines reviewed and updated, cancer screenings are up-to-date, patient education was given. -     Lipid panel; Future -     HIV Antibody (routine testing w rflx); Future -     HIV Antibody (routine testing w rflx) -  Lipid panel  Vasomotor rhinitis -     azelastine (ASTELIN) 0.1 % nasal spray; Place 1 spray into both nostrils 2 (two) times daily. Use in each nostril as directed  Need for vaccination -     Pneumococcal conjugate vaccine 20-valent (Prevnar 20)   I am having Robin Rivera "Robin Rivera" start on azelastine. I am also having her maintain her Calcium Carbonate-Vitamin D3, albuterol, HYDROcodone-acetaminophen, omeprazole, Abaloparatide, Pen Needles, Vitamin D (Ergocalciferol), and amoxicillin-clavulanate.  Meds ordered this encounter  Medications  . azelastine (ASTELIN) 0.1 % nasal spray    Sig: Place 1 spray into both nostrils 2 (two) times daily. Use in each nostril as directed    Dispense:  90 mL    Refill:  1     Follow-up: Return in about 6 months (around 05/12/2021).  Scarlette Calico, MD

## 2020-11-09 NOTE — Patient Instructions (Signed)
Health Maintenance, Female Adopting a healthy lifestyle and getting preventive care are important in promoting health and wellness. Ask your health care provider about:  The right schedule for you to have regular tests and exams.  Things you can do on your own to prevent diseases and keep yourself healthy. What should I know about diet, weight, and exercise? Eat a healthy diet  Eat a diet that includes plenty of vegetables, fruits, low-fat dairy products, and lean protein.  Do not eat a lot of foods that are high in solid fats, added sugars, or sodium.   Maintain a healthy weight Body mass index (BMI) is used to identify weight problems. It estimates body fat based on height and weight. Your health care provider can help determine your BMI and help you achieve or maintain a healthy weight. Get regular exercise Get regular exercise. This is one of the most important things you can do for your health. Most adults should:  Exercise for at least 150 minutes each week. The exercise should increase your heart rate and make you sweat (moderate-intensity exercise).  Do strengthening exercises at least twice a week. This is in addition to the moderate-intensity exercise.  Spend less time sitting. Even light physical activity can be beneficial. Watch cholesterol and blood lipids Have your blood tested for lipids and cholesterol at 65 years of age, then have this test every 5 years. Have your cholesterol levels checked more often if:  Your lipid or cholesterol levels are high.  You are older than 65 years of age.  You are at high risk for heart disease. What should I know about cancer screening? Depending on your health history and family history, you may need to have cancer screening at various ages. This may include screening for:  Breast cancer.  Cervical cancer.  Colorectal cancer.  Skin cancer.  Lung cancer. What should I know about heart disease, diabetes, and high blood  pressure? Blood pressure and heart disease  High blood pressure causes heart disease and increases the risk of stroke. This is more likely to develop in people who have high blood pressure readings, are of African descent, or are overweight.  Have your blood pressure checked: ? Every 3-5 years if you are 18-39 years of age. ? Every year if you are 40 years old or older. Diabetes Have regular diabetes screenings. This checks your fasting blood sugar level. Have the screening done:  Once every three years after age 40 if you are at a normal weight and have a low risk for diabetes.  More often and at a younger age if you are overweight or have a high risk for diabetes. What should I know about preventing infection? Hepatitis B If you have a higher risk for hepatitis B, you should be screened for this virus. Talk with your health care provider to find out if you are at risk for hepatitis B infection. Hepatitis C Testing is recommended for:  Everyone born from 1945 through 1965.  Anyone with known risk factors for hepatitis C. Sexually transmitted infections (STIs)  Get screened for STIs, including gonorrhea and chlamydia, if: ? You are sexually active and are younger than 65 years of age. ? You are older than 65 years of age and your health care provider tells you that you are at risk for this type of infection. ? Your sexual activity has changed since you were last screened, and you are at increased risk for chlamydia or gonorrhea. Ask your health care provider   if you are at risk.  Ask your health care provider about whether you are at high risk for HIV. Your health care provider may recommend a prescription medicine to help prevent HIV infection. If you choose to take medicine to prevent HIV, you should first get tested for HIV. You should then be tested every 3 months for as long as you are taking the medicine. Pregnancy  If you are about to stop having your period (premenopausal) and  you may become pregnant, seek counseling before you get pregnant.  Take 400 to 800 micrograms (mcg) of folic acid every day if you become pregnant.  Ask for birth control (contraception) if you want to prevent pregnancy. Osteoporosis and menopause Osteoporosis is a disease in which the bones lose minerals and strength with aging. This can result in bone fractures. If you are 65 years old or older, or if you are at risk for osteoporosis and fractures, ask your health care provider if you should:  Be screened for bone loss.  Take a calcium or vitamin D supplement to lower your risk of fractures.  Be given hormone replacement therapy (HRT) to treat symptoms of menopause. Follow these instructions at home: Lifestyle  Do not use any products that contain nicotine or tobacco, such as cigarettes, e-cigarettes, and chewing tobacco. If you need help quitting, ask your health care provider.  Do not use street drugs.  Do not share needles.  Ask your health care provider for help if you need support or information about quitting drugs. Alcohol use  Do not drink alcohol if: ? Your health care provider tells you not to drink. ? You are pregnant, may be pregnant, or are planning to become pregnant.  If you drink alcohol: ? Limit how much you use to 0-1 drink a day. ? Limit intake if you are breastfeeding.  Be aware of how much alcohol is in your drink. In the [place] one drink equals one 12 oz bottle of beer (355 mL), one 5 oz glass of wine (148 mL), or one 1 oz glass of hard liquor (44 mL). General instructions  Schedule regular health, dental, and eye exams.  Stay current with your vaccines.  Tell your health care provider if: ? You often feel depressed. ? You have ever been abused or do not feel safe at home. Summary  Adopting a healthy lifestyle and getting preventive care are important in promoting health and wellness.  Follow your health care provider's instructions about healthy  diet, exercising, and getting tested or screened for diseases.  Follow your health care provider's instructions on monitoring your cholesterol and blood pressure. This information is not intended to replace advice given to you by your health care provider. Make sure you discuss any questions you have with your health care provider. Document Revised: 05/30/2018 Document Reviewed: 05/30/2018 Elsevier Patient Education  2021 Elsevier Inc.  

## 2020-11-10 LAB — HIV ANTIBODY (ROUTINE TESTING W REFLEX): HIV 1&2 Ab, 4th Generation: NONREACTIVE

## 2020-11-10 LAB — LIPID PANEL
Cholesterol: 201 mg/dL — ABNORMAL HIGH (ref 0–200)
HDL: 57.1 mg/dL (ref >39.00)
LDL Cholesterol: 111 mg/dL — ABNORMAL HIGH (ref 0–99)
NonHDL: 143.49
Total CHOL/HDL Ratio: 4
Triglycerides: 163 mg/dL — ABNORMAL HIGH (ref 0.0–149.0)
VLDL: 32.6 mg/dL (ref 0.0–40.0)

## 2020-11-17 ENCOUNTER — Other Ambulatory Visit (HOSPITAL_COMMUNITY): Payer: Self-pay

## 2020-11-19 ENCOUNTER — Other Ambulatory Visit (HOSPITAL_COMMUNITY): Payer: Self-pay

## 2020-11-20 ENCOUNTER — Other Ambulatory Visit (HOSPITAL_COMMUNITY): Payer: Self-pay

## 2020-11-24 DIAGNOSIS — H2511 Age-related nuclear cataract, right eye: Secondary | ICD-10-CM | POA: Diagnosis not present

## 2020-11-24 DIAGNOSIS — Z01818 Encounter for other preprocedural examination: Secondary | ICD-10-CM | POA: Diagnosis not present

## 2020-11-24 DIAGNOSIS — H2512 Age-related nuclear cataract, left eye: Secondary | ICD-10-CM | POA: Diagnosis not present

## 2020-11-25 ENCOUNTER — Other Ambulatory Visit (HOSPITAL_COMMUNITY): Payer: Self-pay

## 2020-11-30 ENCOUNTER — Other Ambulatory Visit (HOSPITAL_COMMUNITY): Payer: Self-pay

## 2020-12-03 ENCOUNTER — Other Ambulatory Visit (HOSPITAL_COMMUNITY): Payer: Self-pay

## 2020-12-03 MED ORDER — OFLOXACIN 0.3 % OP SOLN
OPHTHALMIC | 0 refills | Status: DC
  Filled 2020-12-03: qty 5, 14d supply, fill #0

## 2020-12-03 MED ORDER — PREDNISOLONE ACETATE 1 % OP SUSP
OPHTHALMIC | 0 refills | Status: DC
  Filled 2020-12-03: qty 5, 14d supply, fill #0

## 2020-12-10 DIAGNOSIS — H2511 Age-related nuclear cataract, right eye: Secondary | ICD-10-CM | POA: Diagnosis not present

## 2020-12-14 ENCOUNTER — Other Ambulatory Visit (HOSPITAL_COMMUNITY): Payer: Self-pay

## 2020-12-15 ENCOUNTER — Other Ambulatory Visit (HOSPITAL_COMMUNITY): Payer: Self-pay

## 2020-12-15 MED ORDER — PREDNISOLONE ACETATE 1 % OP SUSP
OPHTHALMIC | 0 refills | Status: DC
  Filled 2020-12-15: qty 5, 28d supply, fill #0

## 2020-12-15 MED ORDER — OFLOXACIN 0.3 % OP SOLN
OPHTHALMIC | 0 refills | Status: DC
  Filled 2020-12-15: qty 5, 28d supply, fill #0

## 2020-12-16 ENCOUNTER — Other Ambulatory Visit (HOSPITAL_COMMUNITY): Payer: Self-pay

## 2020-12-16 ENCOUNTER — Telehealth: Payer: Self-pay

## 2020-12-16 NOTE — Telephone Encounter (Signed)
Submitted a Prior Authorization request to [facility] for TYMLOS via CoverMyMeds. Will update once we receive a response.   Key: [id number]

## 2020-12-16 NOTE — Telephone Encounter (Signed)
Received notification from [facility] regarding a prior authorization for TYMLOS. Authorization has been APPROVED from 12/16/2020 to 06/19/2021.   Patient can continue to fill through [place]: [phone]   Authorization # [id number] Phone # [phone]  Test claim revealed that insurance covers [phone] and copay card paid an additional $718.25, leaving patient with a copay of $0.00.

## 2020-12-18 ENCOUNTER — Other Ambulatory Visit: Payer: Self-pay | Admitting: Obstetrics and Gynecology

## 2020-12-18 ENCOUNTER — Other Ambulatory Visit (HOSPITAL_COMMUNITY): Payer: Self-pay

## 2020-12-18 DIAGNOSIS — M810 Age-related osteoporosis without current pathological fracture: Secondary | ICD-10-CM

## 2020-12-22 ENCOUNTER — Other Ambulatory Visit (HOSPITAL_COMMUNITY): Payer: Self-pay

## 2021-01-01 DIAGNOSIS — H25812 Combined forms of age-related cataract, left eye: Secondary | ICD-10-CM | POA: Diagnosis not present

## 2021-01-01 DIAGNOSIS — H2512 Age-related nuclear cataract, left eye: Secondary | ICD-10-CM | POA: Diagnosis not present

## 2021-01-08 ENCOUNTER — Other Ambulatory Visit (HOSPITAL_COMMUNITY): Payer: Self-pay

## 2021-01-12 ENCOUNTER — Other Ambulatory Visit (HOSPITAL_COMMUNITY): Payer: Self-pay

## 2021-01-25 ENCOUNTER — Other Ambulatory Visit (HOSPITAL_COMMUNITY): Payer: Self-pay

## 2021-02-04 ENCOUNTER — Other Ambulatory Visit (HOSPITAL_COMMUNITY): Payer: Self-pay

## 2021-02-05 ENCOUNTER — Other Ambulatory Visit (HOSPITAL_COMMUNITY): Payer: Self-pay

## 2021-02-19 ENCOUNTER — Other Ambulatory Visit (HOSPITAL_BASED_OUTPATIENT_CLINIC_OR_DEPARTMENT_OTHER): Payer: Self-pay

## 2021-02-24 ENCOUNTER — Other Ambulatory Visit (HOSPITAL_COMMUNITY): Payer: Self-pay

## 2021-03-03 NOTE — Progress Notes (Signed)
Office Visit Note  Patient: Robin Rivera             Date of Birth: 16-Mar-1956           MRN: [id number]             PCP: Janith Lima, MD Referring: Janith Lima, MD Visit Date: 03/16/2021 Occupation: '@GUAROCC'$ @  Subjective:  Medication management.   History of Present Illness: Robin Rivera is a 65 y.o. female with history of osteoporosis.  She has been taking Tymlos since June 2020.  She had an interruption for about 4 months and then restarted it.  She has been tolerating the Tymlos injections well.  She also takes calcium and vitamin D on a regular basis.  She states she could not to schedule a follow-up DEXA scan till December 2022.  She came to discuss future treatment options after Tymlos.  Activities of Daily Living:  Patient reports morning stiffness for 0  none .   Patient Denies nocturnal pain.  Difficulty dressing/grooming: Denies Difficulty climbing stairs: Denies Difficulty getting out of chair: Denies Difficulty using hands for taps, buttons, cutlery, and/or writing: Denies  Review of Systems  Constitutional:  Negative for fatigue.  HENT:  Negative for mouth dryness.   Eyes:  Positive for dryness.  Respiratory:  Negative for shortness of breath.   Cardiovascular:  Negative for swelling in legs/feet.  Gastrointestinal:  Negative for constipation.  Endocrine: Negative for cold intolerance.  Genitourinary:  Negative for difficulty urinating.  Musculoskeletal:  Negative for joint pain and joint pain.  Skin:  Negative for rash.  Allergic/Immunologic: Negative for susceptible to infections.  Neurological:  Negative for weakness.  Hematological:  Positive for bruising/bleeding tendency.  Psychiatric/Behavioral:  Negative for sleep disturbance.    PMFS History:  Patient Active Problem List   Diagnosis Date Noted   Hyperlipidemia with target LDL less than 130 11/09/2020   Vasomotor rhinitis 11/09/2020   Encounter for general adult medical  examination with abnormal findings 11/09/2020   Sternal fracture 06/26/2020   Abnormal CT of the chest 04/03/2019   Monoclonal gammopathy 10/22/2016   Age-related osteoporosis without current pathological fracture 09/22/2016   Vitamin D deficiency 09/22/2016   Routine general medical examination at a health care facility 05/02/2016   Underweight 05/02/2016   GERD (gastroesophageal reflux disease) 03/29/2016   Dysphagia 02/04/2016   Coronary artery calcification seen on CAT scan 02/04/2016   Chronic obstructive pulmonary disease ([place]) 08/16/2013   TOBACCO ABUSE 01/09/2009    Past Medical History:  Diagnosis Date   COPD (chronic obstructive pulmonary disease) ([place])    Esophageal stricture    GERD (gastroesophageal reflux disease)    Monoclonal gammopathy    unknown significance, followed at [facility].    Osteoporosis    Rhinitis    Tobacco abuse     Family History  Adopted: Yes  Problem Relation Age of Onset   COPD Mother    Kidney disease Mother    Other Father        his hx is unknown   Colon cancer Maternal Aunt    Healthy Son    Healthy Son    Healthy Son    Past Surgical History:  Procedure Laterality Date   BREAST BIOPSY     BREAST EXCISIONAL BIOPSY Right    BREAST SURGERY Bilateral    lumpectomy    CESAREAN SECTION     COLONOSCOPY     ESOPHAGOGASTRODUODENOSCOPY  KNEE ARTHROSCOPY  08/31/2020   TONSILLECTOMY     TOTAL ABDOMINAL HYSTERECTOMY     Partial hysterectomy   Social History   Social History Narrative   Fun: Play games on her computer, bon fires   Denies abuse and feels safe at home.    Immunization History  Administered Date(s) Administered   Fluad Quad(high Dose 65+) 03/25/2020   Influenza Split 04/12/2011, 04/15/2013, 03/25/2014, 03/22/2016   Influenza,inj,Quad PF,6+ Mos 03/21/2015   PNEUMOCOCCAL CONJUGATE-20 11/09/2020   Pneumococcal Polysaccharide-23 02/15/2016   Tdap 05/02/2016     Objective: Vital Signs: BP  (!) 94/58 (BP Location: Left Arm, Patient Position: Sitting, Cuff Size: Normal)   Pulse 73   Resp 16   Ht '5\' 4"'$  (1.626 m)   Wt 125 lb (56.7 kg)   BMI 21.46 kg/m    Physical Exam Vitals and nursing note reviewed.  Constitutional:      Appearance: She is well-developed.  HENT:     Head: Normocephalic and atraumatic.  Eyes:     Conjunctiva/sclera: Conjunctivae normal.  Cardiovascular:     Rate and Rhythm: Normal rate and regular rhythm.     Heart sounds: Normal heart sounds.  Pulmonary:     Effort: Pulmonary effort is normal.     Breath sounds: Normal breath sounds.  Abdominal:     General: Bowel sounds are normal.     Palpations: Abdomen is soft.  Musculoskeletal:     Cervical back: Normal range of motion.  Lymphadenopathy:     Cervical: No cervical adenopathy.  Skin:    General: Skin is warm and dry.     Capillary Refill: Capillary refill takes less than 2 seconds.  Neurological:     Mental Status: She is alert and oriented to person, place, and time.  Psychiatric:        Behavior: Behavior normal.     Musculoskeletal Exam: C-spine, thoracic and lumbar spine were in good range of motion.  Shoulder joints, elbow joints, wrist joints, MCPs PIPs and DIPs with good range of motion with no synovitis.  Hip joints, knee joints, ankles, MTPs and PIPs with good range of motion.  CDAI Exam: CDAI Score: -- Patient Global: --; Provider Global: -- Swollen: --; Tender: -- Joint Exam 03/16/2021   No joint exam has been documented for this visit   There is currently no information documented on the homunculus. Go to the Rheumatology activity and complete the homunculus joint exam.  Investigation: No additional findings.  Imaging: No results found.  Recent Labs: Lab Results  Component Value Date   WBC 5.1 10/28/2020   HGB 12.5 10/28/2020   PLT 301 10/28/2020   NA 141 10/28/2020   K 3.8 10/28/2020   CL 102 10/28/2020   CO2 30 10/28/2020   GLUCOSE 109 (H) 10/28/2020    BUN 11 10/28/2020   CREATININE 1.17 (H) 10/28/2020   BILITOT <0.2 (L) 10/28/2020   ALKPHOS 96 10/28/2020   AST 12 (L) 10/28/2020   ALT 9 10/28/2020   PROT 7.3 10/28/2020   ALBUMIN 4.0 10/28/2020   CALCIUM 9.9 10/28/2020   GFRAA 52 (L) 10/14/2020    Speciality Comments: Reclast x 3years2017-2020  Tymlos 11/2018-11/2019 03/2020- current  Procedures:  No procedures performed Allergies: Patient has no known allergies.   Assessment / Plan:     Visit Diagnoses: Age-related osteoporosis without current pathological fracture - DEXA results on 12/14/18: BMD measured at femur total right is 0.608 with T-score of -3.2.  Repeat DEXA scheduled for December  2022.  She has been treated with Reclast from 2017 05/2019.  She started [facility] in June 2020 and her treatment was interrupted from June 2021 till October 2021.  She resumed Tymlos in October 2021.  She will be finishing 2 years of Tymlos in October 2022.  She has been taking calcium and vitamin D.  She is also doing exercises.  I did detailed discussion regarding different treatment options for the future.  I discussed the option of going back to Reclast or starting Prolia.  Indication side effects contraindications of Prolia were discussed.  She wants to proceed with Prolia injections.  We will apply for Prolia.  Medication monitoring encounter - Tymlos 3210 mcg/1.56 ml sq daily injections.  Vitamin D deficiency-she is on vitamin D 2000 units daily.  Diet rich in calcium was discussed.  Resistive exercises were discussed.  Coronary artery calcification  Monoclonal gammopathy  History of gastroesophageal reflux (GERD)  History of COPD  Former smoker  Orders: No orders of the defined types were placed in this encounter.  No orders of the defined types were placed in this encounter.    Follow-Up Instructions: Return in about 2 months (around 05/16/2021) for Osteoporosis.   Bo Merino, MD  Note - This record has been created  using Editor, commissioning.  Chart creation errors have been sought, but may not always  have been located. Such creation errors do not reflect on  the standard of medical care.

## 2021-03-05 ENCOUNTER — Other Ambulatory Visit (HOSPITAL_COMMUNITY): Payer: Self-pay

## 2021-03-16 ENCOUNTER — Ambulatory Visit: Payer: Medicare Other | Admitting: Rheumatology

## 2021-03-16 ENCOUNTER — Telehealth: Payer: Self-pay

## 2021-03-16 ENCOUNTER — Other Ambulatory Visit: Payer: Self-pay

## 2021-03-16 ENCOUNTER — Encounter: Payer: Self-pay | Admitting: Rheumatology

## 2021-03-16 VITALS — BP 94/58 | HR 73 | Resp 16 | Ht 64.0 in | Wt 125.0 lb

## 2021-03-16 DIAGNOSIS — Z8709 Personal history of other diseases of the respiratory system: Secondary | ICD-10-CM | POA: Diagnosis not present

## 2021-03-16 DIAGNOSIS — I2584 Coronary atherosclerosis due to calcified coronary lesion: Secondary | ICD-10-CM | POA: Diagnosis not present

## 2021-03-16 DIAGNOSIS — D472 Monoclonal gammopathy: Secondary | ICD-10-CM

## 2021-03-16 DIAGNOSIS — E559 Vitamin D deficiency, unspecified: Secondary | ICD-10-CM

## 2021-03-16 DIAGNOSIS — I2510 Atherosclerotic heart disease of native coronary artery without angina pectoris: Secondary | ICD-10-CM | POA: Diagnosis not present

## 2021-03-16 DIAGNOSIS — Z87891 Personal history of nicotine dependence: Secondary | ICD-10-CM | POA: Diagnosis not present

## 2021-03-16 DIAGNOSIS — M810 Age-related osteoporosis without current pathological fracture: Secondary | ICD-10-CM

## 2021-03-16 DIAGNOSIS — Z5181 Encounter for therapeutic drug level monitoring: Secondary | ICD-10-CM

## 2021-03-16 DIAGNOSIS — Z8719 Personal history of other diseases of the digestive system: Secondary | ICD-10-CM | POA: Diagnosis not present

## 2021-03-16 NOTE — Telephone Encounter (Signed)
Please apply for prolia per Dr. Estanislado Pandy. Patient is currently on tymlos and will be finishing in October 2022 per Dr. Estanislado Pandy. Thanks!

## 2021-03-16 NOTE — Patient Instructions (Signed)
Denosumab injection What is this medication? DENOSUMAB (den oh sue mab) slows bone breakdown. Prolia is used to treat osteoporosis in women after menopause and in men, and in people who are taking corticosteroids for 6 months or more. Xgeva is used to treat a high calcium level due to cancer and to prevent bone fractures and other bone problems caused by multiple myeloma or cancer bone metastases. Xgeva is also used to treat giant cell tumor of the bone. This medicine may be used for other purposes; ask your health care provider or pharmacist if you have questions. COMMON BRAND NAME(S): Prolia, XGEVA What should I tell my care team before I take this medication? They need to know if you have any of these conditions: dental disease having surgery or tooth extraction infection kidney disease low levels of calcium or Vitamin D in the blood malnutrition on hemodialysis skin conditions or sensitivity thyroid or parathyroid disease an unusual reaction to denosumab, other medicines, foods, dyes, or preservatives pregnant or trying to get pregnant breast-feeding How should I use this medication? This medicine is for injection under the skin. It is given by a health care professional in a hospital or clinic setting. A special MedGuide will be given to you before each treatment. Be sure to read this information carefully each time. For Prolia, talk to your pediatrician regarding the use of this medicine in children. Special care may be needed. For Xgeva, talk to your pediatrician regarding the use of this medicine in children. While this drug may be prescribed for children as young as 13 years for selected conditions, precautions do apply. Overdosage: If you think you have taken too much of this medicine contact a poison control center or emergency room at once. NOTE: This medicine is only for you. Do not share this medicine with others. What if I miss a dose? It is important not to miss your dose.  Call your doctor or health care professional if you are unable to keep an appointment. What may interact with this medication? Do not take this medicine with any of the following medications: other medicines containing denosumab This medicine may also interact with the following medications: medicines that lower your chance of fighting infection steroid medicines like prednisone or cortisone This list may not describe all possible interactions. Give your health care provider a list of all the medicines, herbs, non-prescription drugs, or dietary supplements you use. Also tell them if you smoke, drink alcohol, or use illegal drugs. Some items may interact with your medicine. What should I watch for while using this medication? Visit your doctor or health care professional for regular checks on your progress. Your doctor or health care professional may order blood tests and other tests to see how you are doing. Call your doctor or health care professional for advice if you get a fever, chills or sore throat, or other symptoms of a cold or flu. Do not treat yourself. This drug may decrease your body's ability to fight infection. Try to avoid being around people who are sick. You should make sure you get enough calcium and vitamin D while you are taking this medicine, unless your doctor tells you not to. Discuss the foods you eat and the vitamins you take with your health care professional. See your dentist regularly. Brush and floss your teeth as directed. Before you have any dental work done, tell your dentist you are receiving this medicine. Do not become pregnant while taking this medicine or for 5 months after   stopping it. Talk with your doctor or health care professional about your birth control options while taking this medicine. Women should inform their doctor if they wish to become pregnant or think they might be pregnant. There is a potential for serious side effects to an unborn child. Talk to  your health care professional or pharmacist for more information. What side effects may I notice from receiving this medication? Side effects that you should report to your doctor or health care professional as soon as possible: allergic reactions like skin rash, itching or hives, swelling of the face, lips, or tongue bone pain breathing problems dizziness jaw pain, especially after dental work redness, blistering, peeling of the skin signs and symptoms of infection like fever or chills; cough; sore throat; pain or trouble passing urine signs of low calcium like fast heartbeat, muscle cramps or muscle pain; pain, tingling, numbness in the hands or feet; seizures unusual bleeding or bruising unusually weak or tired Side effects that usually do not require medical attention (report to your doctor or health care professional if they continue or are bothersome): constipation diarrhea headache joint pain loss of appetite muscle pain runny nose tiredness upset stomach This list may not describe all possible side effects. Call your doctor for medical advice about side effects. You may report side effects to FDA at 1-800-FDA-1088. Where should I keep my medication? This medicine is only given in a clinic, doctor's office, or other health care setting and will not be stored at home. NOTE: This sheet is a summary. It may not cover all possible information. If you have questions about this medicine, talk to your doctor, pharmacist, or health care provider.  2022 Elsevier/Gold Standard (2017-10-13 16:10:44)

## 2021-03-17 ENCOUNTER — Other Ambulatory Visit (HOSPITAL_COMMUNITY): Payer: Self-pay

## 2021-03-17 NOTE — Telephone Encounter (Signed)
Medical benefit:  Findings of benefits investigation for Prolia [id number]?:   Insurance:?[employer]   Phone:??[phone]   Plan is ACTIVE   Plan?follows Medicare guidelines and?covers 80% of the infusion and no authorization is required?for [phone]. Patient will be required to pay the 20% coinsurance per each infusion, which will be applied to [employer]. Patient has met $520 out of $4500 as of 03/17/21.  Ref# [id number] Rep# [place] benefit- Copay is $128.65

## 2021-03-22 ENCOUNTER — Other Ambulatory Visit (HOSPITAL_COMMUNITY): Payer: Self-pay

## 2021-03-22 ENCOUNTER — Other Ambulatory Visit: Payer: Self-pay | Admitting: *Deleted

## 2021-03-22 DIAGNOSIS — Z87891 Personal history of nicotine dependence: Secondary | ICD-10-CM

## 2021-03-24 ENCOUNTER — Other Ambulatory Visit (HOSPITAL_COMMUNITY): Payer: Self-pay

## 2021-03-31 NOTE — Telephone Encounter (Signed)
Left VM to discuss Prolia BIV findings. Requested return call tomorrow when I'll be onsite to review. Will f/u  Knox Saliva, PharmD, MPH, BCPS Clinical Pharmacist (Rheumatology and Pulmonology)

## 2021-04-06 ENCOUNTER — Other Ambulatory Visit: Payer: Self-pay | Admitting: Obstetrics and Gynecology

## 2021-04-06 DIAGNOSIS — Z1231 Encounter for screening mammogram for malignant neoplasm of breast: Secondary | ICD-10-CM

## 2021-04-06 NOTE — Telephone Encounter (Signed)
ATC #2 patient to review Prolia BIV findings. Unable to reach - left VM requesting return call to review. Will f/u again upcoming week  Knox Saliva, PharmD, MPH, BCPS Clinical Pharmacist (Rheumatology and Pulmonology)

## 2021-04-09 ENCOUNTER — Other Ambulatory Visit (HOSPITAL_COMMUNITY): Payer: Self-pay

## 2021-04-09 ENCOUNTER — Other Ambulatory Visit: Payer: Self-pay | Admitting: Acute Care

## 2021-04-09 DIAGNOSIS — J449 Chronic obstructive pulmonary disease, unspecified: Secondary | ICD-10-CM

## 2021-04-09 MED ORDER — SPIRIVA RESPIMAT 2.5 MCG/ACT IN AERS
2.0000 | INHALATION_SPRAY | Freq: Every day | RESPIRATORY_TRACT | 5 refills | Status: DC
  Filled 2021-04-09 – 2021-05-12 (×2): qty 4, 30d supply, fill #0

## 2021-04-12 ENCOUNTER — Other Ambulatory Visit (HOSPITAL_COMMUNITY): Payer: Self-pay

## 2021-04-13 NOTE — Telephone Encounter (Signed)
ATC #3 to review Prolia BIV findings. Unable to reach, left VM requesting return call  Knox Saliva, PharmD, MPH, BCPS Clinical Pharmacist (Rheumatology and Pulmonology)

## 2021-04-14 ENCOUNTER — Other Ambulatory Visit (HOSPITAL_COMMUNITY): Payer: Self-pay

## 2021-04-16 ENCOUNTER — Other Ambulatory Visit (HOSPITAL_COMMUNITY): Payer: Self-pay

## 2021-04-19 ENCOUNTER — Other Ambulatory Visit (HOSPITAL_COMMUNITY): Payer: Self-pay

## 2021-04-21 ENCOUNTER — Other Ambulatory Visit (HOSPITAL_COMMUNITY): Payer: Self-pay

## 2021-04-24 ENCOUNTER — Other Ambulatory Visit (HOSPITAL_COMMUNITY): Payer: Self-pay

## 2021-04-28 ENCOUNTER — Other Ambulatory Visit (HOSPITAL_COMMUNITY): Payer: Self-pay

## 2021-04-29 ENCOUNTER — Other Ambulatory Visit (HOSPITAL_COMMUNITY): Payer: Self-pay

## 2021-05-04 NOTE — Progress Notes (Signed)
Office Visit Note  Patient: Robin Rivera             Date of Birth: 1956/06/11           MRN: [id number]             PCP: Janith Lima, MD Referring: Janith Lima, MD Visit Date: 05/18/2021 Occupation: @GUAROCC @  Subjective:  Discussed osteoporosis treatment options  History of Present Illness: Robin Rivera is a 65 y.o. female with history of osteoporosis and vitamin D deficiency.  Patient completed 2 years of Tymlos in October 2022.  She has continued to take a calcium and vitamin D supplement as recommended.  She presents today to discuss future treatment options.  She states that she is scheduled for a bone density on 06/03/2021 so she would like to hold off on starting anything until after the bone density has been completed.  She denies any recent falls or fractures.  She denies any joint pain or joint swelling at this time.  She denies any new questions or concerns.   Activities of Daily Living:  Patient reports morning stiffness for 0  none .   Patient Denies nocturnal pain.  Difficulty dressing/grooming: Denies Difficulty climbing stairs: Denies Difficulty getting out of chair: Denies Difficulty using hands for taps, buttons, cutlery, and/or writing: Denies  Review of Systems  Constitutional:  Negative for fatigue.  HENT:  Negative for mouth sores, mouth dryness and nose dryness.   Eyes:  Negative for pain, visual disturbance and dryness.  Respiratory:  Negative for cough, hemoptysis, shortness of breath and difficulty breathing.   Cardiovascular:  Negative for chest pain, palpitations, hypertension and swelling in legs/feet.  Gastrointestinal:  Negative for blood in stool, constipation and diarrhea.  Endocrine: Negative for excessive thirst and increased urination.  Genitourinary:  Negative for difficulty urinating and painful urination.  Musculoskeletal:  Negative for joint pain, joint pain, joint swelling, myalgias, muscle weakness, morning stiffness,  muscle tenderness and myalgias.  Skin:  Negative for color change, pallor, rash, hair loss, nodules/bumps, skin tightness, ulcers and sensitivity to sunlight.  Allergic/Immunologic: Negative for susceptible to infections.  Neurological:  Negative for dizziness, numbness, headaches and weakness.  Hematological:  Negative for bruising/bleeding tendency and swollen glands.  Psychiatric/Behavioral:  Negative for depressed mood and sleep disturbance. The patient is not nervous/anxious.    PMFS History:  Patient Active Problem List   Diagnosis Date Noted   Hyperlipidemia with target LDL less than 130 11/09/2020   Vasomotor rhinitis 11/09/2020   Encounter for general adult medical examination with abnormal findings 11/09/2020   Sternal fracture 06/26/2020   Abnormal CT of the chest 04/03/2019   Monoclonal gammopathy 10/22/2016   Age-related osteoporosis without current pathological fracture 09/22/2016   Vitamin D deficiency 09/22/2016   Routine general medical examination at a health care facility 05/02/2016   Underweight 05/02/2016   GERD (gastroesophageal reflux disease) 03/29/2016   Dysphagia 02/04/2016   Coronary artery calcification seen on CAT scan 02/04/2016   Chronic obstructive pulmonary disease ([place]) 08/16/2013   TOBACCO ABUSE 01/09/2009    Past Medical History:  Diagnosis Date   COPD (chronic obstructive pulmonary disease) ([place])    Esophageal stricture    GERD (gastroesophageal reflux disease)    Monoclonal gammopathy    unknown significance, [facility].    Osteoporosis    Rhinitis    Tobacco abuse     Family History  Adopted: Yes  Problem Relation Age of Onset  COPD Mother    Kidney disease Mother    Other Father        his hx is unknown   Colon cancer Maternal Aunt    Healthy Son    Healthy Son    Healthy Son    Past Surgical History:  Procedure Laterality Date   BREAST BIOPSY     BREAST EXCISIONAL BIOPSY Right    BREAST  SURGERY Bilateral    lumpectomy    CESAREAN SECTION     COLONOSCOPY     ESOPHAGOGASTRODUODENOSCOPY     KNEE ARTHROSCOPY  08/31/2020   TONSILLECTOMY     TOTAL ABDOMINAL HYSTERECTOMY     Partial hysterectomy   Social History   Social History Narrative   Fun: Play games on her computer, bon fires   Denies abuse and feels safe at home.    Immunization History  Administered Date(s) Administered   Fluad Quad(high Dose 65+) 03/25/2020   Influenza Split 04/12/2011, 04/15/2013, 03/25/2014, 03/22/2016   Influenza,inj,Quad PF,6+ Mos 03/21/2015   PNEUMOCOCCAL CONJUGATE-20 11/09/2020   Pneumococcal Polysaccharide-23 02/15/2016   Tdap 05/02/2016     Objective: Vital Signs: BP 101/65 (BP Location: Right Arm, Patient Position: Sitting, Cuff Size: Normal)   Pulse 74   Resp 15   Ht 5\' 4"  (1.626 m)   Wt 124 lb (56.2 kg)   BMI 21.28 kg/m    Physical Exam Vitals and nursing note reviewed.  Constitutional:      Appearance: She is well-developed.  HENT:     Head: Normocephalic and atraumatic.  Eyes:     Conjunctiva/sclera: Conjunctivae normal.  Pulmonary:     Effort: Pulmonary effort is normal.  Abdominal:     Palpations: Abdomen is soft.  Musculoskeletal:     Cervical back: Normal range of motion.  Skin:    General: Skin is warm and dry.     Capillary Refill: Capillary refill takes less than 2 seconds.  Neurological:     Mental Status: She is alert and oriented to person, place, and time.  Psychiatric:        Behavior: Behavior normal.     Musculoskeletal Exam: C-spine, thoracic spine, lumbar spine good range of motion with no discomfort.  No midline spinal tenderness or SI joint tenderness.  Shoulder joints, elbow joints, wrist joints, MCPs, PIPs, DIPs have good range of motion with no synovitis.  Complete fist formation bilaterally.  Hip joints have good range of motion with no discomfort.  Knee joints have good range of motion with no warmth or effusion.  Ankle joints have good  range of motion with no tenderness or joint swelling.  CDAI Exam: CDAI Score: -- Patient Global: --; Provider Global: -- Swollen: --; Tender: -- Joint Exam 05/18/2021   No joint exam has been documented for this visit   There is currently no information documented on the homunculus. Go to the Rheumatology activity and complete the homunculus joint exam.  Investigation: No additional findings.  Imaging: MM 3D SCREEN BREAST BILATERAL  Result Date: 05/12/2021 CLINICAL DATA:  Screening. EXAM: DIGITAL SCREENING BILATERAL MAMMOGRAM WITH TOMOSYNTHESIS AND CAD TECHNIQUE: Bilateral screening digital craniocaudal and mediolateral oblique mammograms were obtained. Bilateral screening digital breast tomosynthesis was performed. The images were evaluated with computer-aided detection. COMPARISON:  Previous exam(s). ACR Breast Density Category c: The breast tissue is heterogeneously dense, which may obscure small masses. FINDINGS: There are no findings suspicious for malignancy. IMPRESSION: No mammographic evidence of malignancy. A result letter of this screening mammogram will be  mailed directly to the patient. RECOMMENDATION: Screening mammogram in one year. (Code:SM-B-01Y) BI-RADS CATEGORY  1: Negative. Electronically Signed   By: Abelardo Diesel M.D.   On: 05/12/2021 09:30    Recent Labs: Lab Results  Component Value Date   WBC 5.1 10/28/2020   HGB 12.5 10/28/2020   PLT 301 10/28/2020   NA 141 10/28/2020   K 3.8 10/28/2020   CL 102 10/28/2020   CO2 30 10/28/2020   GLUCOSE 109 (H) 10/28/2020   BUN 11 10/28/2020   CREATININE 1.17 (H) 10/28/2020   BILITOT <0.2 (L) 10/28/2020   ALKPHOS 96 10/28/2020   AST 12 (L) 10/28/2020   ALT 9 10/28/2020   PROT 7.3 10/28/2020   ALBUMIN 4.0 10/28/2020   CALCIUM 9.9 10/28/2020   GFRAA 52 (L) 10/14/2020    Speciality Comments: Reclast x 3years2017-2020  Tymlos 11/2018-11/2019 03/2020- current  Procedures:  No procedures performed Allergies:  Patient has no known allergies.   Assessment / Plan:     Visit Diagnoses: Age-related osteoporosis without current pathological fracture - DEXA results on 12/14/18: BMD measured at femur total right is 0.608 with T-score of -3.2.  She is scheduled for an updated DEXA on 06/03/21.   Previous therapy includes: Reclast 2017-2020 followed by Tymlos 2020-October 2022.  She is currently taking a calcium and vitamin D supplement on a daily basis.  Discussed that the recommended dose of calcium is 1200 mg (400 mg 3 times daily) and vitamin D 2000 units daily.  Calcium and vitamin D level will be checked today.  Discussed the importance of resistive exercises.  She has not had any recent falls or fractures.  Discussed the importance of fall prevention.  No thoracic kyphosis or midline spinal tenderness was noted on examination today. She presented today to discuss different treatment options going forward.  At her last office visit on 03/16/2021 Dr. Estanislado Pandy discussed proceeding with Prolia if approved by her insurance.  Indications, contraindications, potential side effects of Prolia were discussed today in detail.  All questions were addressed today.  We will apply for Prolia through her insurance and once approved she will be scheduled for the injection pending lab results.  She was advised to notify us if she cannot tolerate taking Prolia. She will follow-up in the office in 3 months.   - Plan: COMPLETE METABOLIC PANEL WITH GFR, CBC with Differential/Platelet, VITAMIN D 25 Hydroxy (Vit-D Deficiency, Fractures)  Medication monitoring encounter -Previous therapy includes: Reclast IV infusions 2017-2020.  Tymlos-completed 2 years of therapy in October 2022. Applying for Prolia 60 mg sq injections every 6 months through her insurance.  CBC, CMP, and vitamin D updated today prior to scheduling the injection. - Plan: COMPLETE METABOLIC PANEL WITH GFR, CBC with Differential/Platelet  Vitamin D deficiency - Vitamin D  level will be checked today. Plan: VITAMIN D 25 Hydroxy (Vit-D Deficiency, Fractures)  Monoclonal gammopathy: Followed yearly by Dr. Lindi Adie.   Other medical conditions are listed as follows:   Coronary artery calcification  History of gastroesophageal reflux (GERD)  History of COPD  Former smoker  Orders: Orders Placed This Encounter  Procedures   COMPLETE METABOLIC PANEL WITH GFR   CBC with Differential/Platelet   VITAMIN D 25 Hydroxy (Vit-D Deficiency, Fractures)   No orders of the defined types were placed in this encounter.     Follow-Up Instructions: Return in about 3 months (around 08/17/2021) for Osteoporosis.   Ofilia Neas, PA-C  Note - This record has been created using Dragon software.  Chart creation errors have been sought, but may not always  have been located. Such creation errors do not reflect on  the standard of medical care.

## 2021-05-11 ENCOUNTER — Ambulatory Visit
Admission: RE | Admit: 2021-05-11 | Discharge: 2021-05-11 | Disposition: A | Discharge status: Home / Self Care | Payer: Medicare Other | Source: Ambulatory Visit

## 2021-05-11 DIAGNOSIS — Z1231 Encounter for screening mammogram for malignant neoplasm of breast: Secondary | ICD-10-CM | POA: Diagnosis not present

## 2021-05-12 ENCOUNTER — Other Ambulatory Visit (HOSPITAL_COMMUNITY): Payer: Self-pay

## 2021-05-12 ENCOUNTER — Telehealth: Payer: Self-pay | Admitting: Pulmonary Disease

## 2021-05-12 DIAGNOSIS — J449 Chronic obstructive pulmonary disease, unspecified: Secondary | ICD-10-CM

## 2021-05-12 MED ORDER — SPIRIVA RESPIMAT 2.5 MCG/ACT IN AERS
2.0000 | INHALATION_SPRAY | Freq: Every day | RESPIRATORY_TRACT | 5 refills | Status: DC
  Filled 2021-05-12: qty 4, 30d supply, fill #0
  Filled 2021-12-09: qty 4, 30d supply, fill #1
  Filled 2022-01-24: qty 4, 30d supply, fill #2
  Filled 2022-02-18: qty 4, 30d supply, fill #3

## 2021-05-12 NOTE — Telephone Encounter (Signed)
New RX has been sent to the pharmacy

## 2021-05-18 ENCOUNTER — Ambulatory Visit: Payer: Medicare Other | Admitting: Physician Assistant

## 2021-05-18 ENCOUNTER — Encounter: Payer: Self-pay | Admitting: Physician Assistant

## 2021-05-18 ENCOUNTER — Other Ambulatory Visit: Payer: Self-pay

## 2021-05-18 VITALS — BP 101/65 | HR 74 | Resp 15 | Ht 64.0 in | Wt 124.0 lb

## 2021-05-18 DIAGNOSIS — M79642 Pain in left hand: Secondary | ICD-10-CM

## 2021-05-18 DIAGNOSIS — Z5181 Encounter for therapeutic drug level monitoring: Secondary | ICD-10-CM

## 2021-05-18 DIAGNOSIS — M79641 Pain in right hand: Secondary | ICD-10-CM

## 2021-05-18 DIAGNOSIS — Z8709 Personal history of other diseases of the respiratory system: Secondary | ICD-10-CM

## 2021-05-18 DIAGNOSIS — Z87891 Personal history of nicotine dependence: Secondary | ICD-10-CM

## 2021-05-18 DIAGNOSIS — I2510 Atherosclerotic heart disease of native coronary artery without angina pectoris: Secondary | ICD-10-CM

## 2021-05-18 DIAGNOSIS — D472 Monoclonal gammopathy: Secondary | ICD-10-CM

## 2021-05-18 DIAGNOSIS — M810 Age-related osteoporosis without current pathological fracture: Secondary | ICD-10-CM

## 2021-05-18 DIAGNOSIS — M0579 Rheumatoid arthritis with rheumatoid factor of multiple sites without organ or systems involvement: Secondary | ICD-10-CM | POA: Diagnosis not present

## 2021-05-18 DIAGNOSIS — E559 Vitamin D deficiency, unspecified: Secondary | ICD-10-CM | POA: Diagnosis not present

## 2021-05-18 DIAGNOSIS — M79672 Pain in left foot: Secondary | ICD-10-CM | POA: Diagnosis not present

## 2021-05-18 DIAGNOSIS — M79671 Pain in right foot: Secondary | ICD-10-CM | POA: Diagnosis not present

## 2021-05-18 DIAGNOSIS — Z8719 Personal history of other diseases of the digestive system: Secondary | ICD-10-CM | POA: Diagnosis not present

## 2021-05-18 DIAGNOSIS — I2584 Coronary atherosclerosis due to calcified coronary lesion: Secondary | ICD-10-CM | POA: Diagnosis not present

## 2021-05-18 NOTE — Telephone Encounter (Signed)
Patient states she is scheduled for a bone density scan on 06/03/2021. Per Hazel Sams, PA-C, prolia will be after updated DEXA scan. Thanks!

## 2021-05-19 ENCOUNTER — Other Ambulatory Visit (HOSPITAL_COMMUNITY): Payer: Self-pay

## 2021-05-19 LAB — CBC WITH DIFFERENTIAL/PLATELET
Absolute Monocytes: 422 cells/uL (ref 200–950)
Basophils Absolute: 80 cells/uL (ref 0–200)
Basophils Relative: 1.4 %
Eosinophils Absolute: 222 cells/uL (ref 15–500)
Eosinophils Relative: 3.9 %
HCT: 40.9 % (ref 35.0–45.0)
Hemoglobin: 13.3 g/dL (ref 11.7–15.5)
Lymphs Abs: 1550 cells/uL (ref 850–3900)
MCH: 29.1 pg (ref 27.0–33.0)
MCHC: 32.5 g/dL (ref 32.0–36.0)
MCV: 89.5 fL (ref 80.0–100.0)
MPV: 9.7 fL (ref 7.5–12.5)
Monocytes Relative: 7.4 %
Neutro Abs: 3426 cells/uL (ref 1500–7800)
Neutrophils Relative %: 60.1 %
Platelets: 308 10*3/uL (ref 140–400)
RBC: 4.57 10*6/uL (ref 3.80–5.10)
RDW: 13.3 % (ref 11.0–15.0)
Total Lymphocyte: 27.2 %
WBC: 5.7 10*3/uL (ref 3.8–10.8)

## 2021-05-19 LAB — COMPLETE METABOLIC PANEL WITH GFR
AG Ratio: 1.5 (calc) (ref 1.0–2.5)
ALT: 9 U/L (ref 6–29)
AST: 12 U/L (ref 10–35)
Albumin: 4.1 g/dL (ref 3.6–5.1)
Alkaline phosphatase (APISO): 91 U/L (ref 37–153)
BUN: 12 mg/dL (ref 7–25)
CO2: 28 mmol/L (ref 20–32)
Calcium: 9.9 mg/dL (ref 8.6–10.4)
Chloride: 104 mmol/L (ref 98–110)
Creat: 1.01 mg/dL (ref 0.50–1.05)
Globulin: 2.8 g/dL (calc) (ref 1.9–3.7)
Glucose, Bld: 97 mg/dL (ref 65–99)
Potassium: 4 mmol/L (ref 3.5–5.3)
Sodium: 141 mmol/L (ref 135–146)
Total Bilirubin: 0.2 mg/dL (ref 0.2–1.2)
Total Protein: 6.9 g/dL (ref 6.1–8.1)
eGFR: 62 mL/min/{1.73_m2} (ref >=60)

## 2021-05-19 LAB — VITAMIN D 25 HYDROXY (VIT D DEFICIENCY, FRACTURES): Vit D, 25-Hydroxy: 39 ng/mL (ref 30–100)

## 2021-05-19 NOTE — Telephone Encounter (Signed)
ATC patient to review Prolia findings. Left VM requesting return call in morning. Will f/u  Knox Saliva, PharmD, MPH, BCPS Clinical Pharmacist (Rheumatology and Pulmonology)

## 2021-05-19 NOTE — Progress Notes (Signed)
CBC and CMP WNL.  Vitamin D is WNL-39.  Please advise the patient to continue to take a maintenance dose of vitamin D.

## 2021-06-03 ENCOUNTER — Ambulatory Visit
Admission: RE | Admit: 2021-06-03 | Discharge: 2021-06-03 | Disposition: A | Discharge status: Home / Self Care | Payer: Medicare Other | Source: Ambulatory Visit | Attending: Obstetrics and Gynecology | Admitting: Obstetrics and Gynecology

## 2021-06-03 DIAGNOSIS — Z780 Asymptomatic menopausal state: Secondary | ICD-10-CM | POA: Diagnosis not present

## 2021-06-03 DIAGNOSIS — M810 Age-related osteoporosis without current pathological fracture: Secondary | ICD-10-CM | POA: Diagnosis not present

## 2021-06-04 NOTE — Progress Notes (Signed)
DEXA scan is consistent with osteoporosis.  No comparison is noted in the report.  Based on chart review we will be starting patient on Prolia.

## 2021-06-18 ENCOUNTER — Other Ambulatory Visit (HOSPITAL_COMMUNITY): Payer: Self-pay

## 2021-06-21 ENCOUNTER — Other Ambulatory Visit (HOSPITAL_COMMUNITY): Payer: Self-pay

## 2021-06-29 ENCOUNTER — Other Ambulatory Visit (HOSPITAL_COMMUNITY): Payer: Self-pay

## 2021-07-13 ENCOUNTER — Other Ambulatory Visit (HOSPITAL_COMMUNITY): Payer: Self-pay

## 2021-07-28 ENCOUNTER — Other Ambulatory Visit (HOSPITAL_COMMUNITY): Payer: Self-pay

## 2021-07-28 NOTE — Telephone Encounter (Signed)
Per test claim through pharmacy, copay for Prolia is $300.  Unable to reach on home or mobile numbers. Left VM requesting return call  Knox Saliva, PharmD, MPH, BCPS Clinical Pharmacist (Rheumatology and Pulmonology)

## 2021-08-05 NOTE — Telephone Encounter (Signed)
ATC #3 patient to review Prolia again. Unable to reach. Left VM requesting return call. Patient has f/u visit on 08/17/21 in afternoon.  Knox Saliva, PharmD, MPH, BCPS Clinical Pharmacist (Rheumatology and Pulmonology)

## 2021-08-12 ENCOUNTER — Other Ambulatory Visit: Payer: Self-pay | Admitting: Internal Medicine

## 2021-08-12 ENCOUNTER — Other Ambulatory Visit (HOSPITAL_COMMUNITY): Payer: Self-pay

## 2021-08-13 ENCOUNTER — Other Ambulatory Visit (HOSPITAL_COMMUNITY): Payer: Self-pay

## 2021-08-13 MED ORDER — OMEPRAZOLE 40 MG PO CPDR
DELAYED_RELEASE_CAPSULE | ORAL | 1 refills | Status: DC
  Filled 2021-08-13: qty 60, 30d supply, fill #0

## 2021-08-17 ENCOUNTER — Ambulatory Visit: Payer: Medicare Other | Admitting: Rheumatology

## 2021-08-24 ENCOUNTER — Telehealth: Payer: Self-pay | Admitting: *Deleted

## 2021-08-24 NOTE — Telephone Encounter (Signed)
Left message for pt to call to schedule f/u lung screening CT scan ? ?

## 2021-08-26 NOTE — Telephone Encounter (Signed)
I have not heard back from patient regarding Prolia start. Will close encounter at this time. ? ?Knox Saliva, PharmD, MPH, BCPS ?Clinical Pharmacist (Rheumatology and Pulmonology) ?

## 2021-09-07 NOTE — Telephone Encounter (Signed)
Left another message for pt to call back to schedule f/u lung screening CT scan.  ?

## 2021-09-08 ENCOUNTER — Other Ambulatory Visit: Payer: Self-pay | Admitting: Nurse Practitioner

## 2021-09-08 ENCOUNTER — Other Ambulatory Visit (HOSPITAL_COMMUNITY): Payer: Self-pay

## 2021-09-08 MED ORDER — OMEPRAZOLE 40 MG PO CPDR
DELAYED_RELEASE_CAPSULE | ORAL | 1 refills | Status: AC
  Filled 2021-09-08: qty 180, 90d supply, fill #0
  Filled 2021-12-09: qty 180, 90d supply, fill #1

## 2021-09-13 ENCOUNTER — Other Ambulatory Visit: Payer: Self-pay

## 2021-09-13 ENCOUNTER — Ambulatory Visit (INDEPENDENT_AMBULATORY_CARE_PROVIDER_SITE_OTHER)
Admission: RE | Admit: 2021-09-13 | Discharge: 2021-09-13 | Disposition: A | Discharge status: Home / Self Care | Payer: Medicare Other | Source: Ambulatory Visit | Attending: Acute Care | Admitting: Acute Care

## 2021-09-13 DIAGNOSIS — Z87891 Personal history of nicotine dependence: Secondary | ICD-10-CM

## 2021-09-16 ENCOUNTER — Other Ambulatory Visit: Payer: Self-pay

## 2021-09-16 DIAGNOSIS — Z87891 Personal history of nicotine dependence: Secondary | ICD-10-CM

## 2021-09-16 DIAGNOSIS — J449 Chronic obstructive pulmonary disease, unspecified: Secondary | ICD-10-CM

## 2021-10-29 NOTE — Progress Notes (Signed)
HEMATOLOGY-ONCOLOGY TELEPHONE VISIT PROGRESS NOTE  I connected with Robin Rivera on 11/09/21 at  9:00 AM EDT by telephone and verified that I am speaking with the correct person using two identifiers.  I discussed the limitations, risks, security and privacy concerns of performing an evaluation and management service by telephone and the availability of in person appointments.  I also discussed with the patient that there may be a patient responsible charge related to this service. The patient expressed understanding and agreed to proceed.   History of Present Illness: Follow-up of MGUS Robin Rivera is 66 year old with above-mentioned history of MGUS who is here by connection with telephone to discuss results of recently performed lab work.  She has been in excellent health and has no new problems or concerns.  REVIEW OF SYSTEMS:   Constitutional: Denies fevers, chills or abnormal weight loss   All other systems were reviewed with the patient and are negative. Observations/Objective:     Assessment Plan:  Monoclonal gammopathy 09/26/2016: M protein 0.2 g 10/28/20: M-Protein 0.3 gm, IgM Lambda, Kappa: 29.6, Lambda 24.9, ratio 1.19, CBCD and CMP Nrmal except for creatinine 1.17  11/02/2021: M protein 0.3 g, Kappa 33.8, lambda 23.2, ratio 1.46, CBC and CMP normal, creatinine 0.99, calcium 9.4, hemoglobin 12.7   Labs are stable RTC in 1 year with labs done ahead of time   I discussed the assessment and treatment plan with the patient. The patient was provided an opportunity to ask questions and all were answered. The patient agreed with the plan and demonstrated an understanding of the instructions. The patient was advised to call back or seek an in-person evaluation if the symptoms worsen or if the condition fails to improve as anticipated.   I provided 11 minutes of non-face-to-face time during this encounter. Harriette Ohara, MD

## 2021-11-02 ENCOUNTER — Inpatient Hospital Stay: Payer: Medicare Other | Attending: Hematology and Oncology

## 2021-11-02 DIAGNOSIS — D649 Anemia, unspecified: Secondary | ICD-10-CM | POA: Diagnosis not present

## 2021-11-02 DIAGNOSIS — Z79899 Other long term (current) drug therapy: Secondary | ICD-10-CM | POA: Insufficient documentation

## 2021-11-02 DIAGNOSIS — C9000 Multiple myeloma not having achieved remission: Secondary | ICD-10-CM | POA: Diagnosis not present

## 2021-11-02 DIAGNOSIS — D472 Monoclonal gammopathy: Secondary | ICD-10-CM

## 2021-11-02 LAB — CBC WITH DIFFERENTIAL (CANCER CENTER ONLY)
Abs Immature Granulocytes: 0.01 10*3/uL (ref 0.00–0.07)
Basophils Absolute: 0.1 10*3/uL (ref 0.0–0.1)
Basophils Relative: 2 %
Eosinophils Absolute: 0.2 10*3/uL (ref 0.0–0.5)
Eosinophils Relative: 4 %
HCT: 39 % (ref 36.0–46.0)
Hemoglobin: 12.7 g/dL (ref 12.0–15.0)
Immature Granulocytes: 0 %
Lymphocytes Relative: 30 %
Lymphs Abs: 1.5 10*3/uL (ref 0.7–4.0)
MCH: 29.5 pg (ref 26.0–34.0)
MCHC: 32.6 g/dL (ref 30.0–36.0)
MCV: 90.5 fL (ref 80.0–100.0)
Monocytes Absolute: 0.4 10*3/uL (ref 0.1–1.0)
Monocytes Relative: 7 %
Neutro Abs: 2.9 10*3/uL (ref 1.7–7.7)
Neutrophils Relative %: 57 %
Platelet Count: 303 10*3/uL (ref 150–400)
RBC: 4.31 MIL/uL (ref 3.87–5.11)
RDW: 14.5 % (ref 11.5–15.5)
WBC Count: 5 10*3/uL (ref 4.0–10.5)
nRBC: 0 % (ref 0.0–0.2)

## 2021-11-02 LAB — CMP (CANCER CENTER ONLY)
ALT: 10 U/L (ref 0–44)
AST: 12 U/L — ABNORMAL LOW (ref 15–41)
Albumin: 4.1 g/dL (ref 3.5–5.0)
Alkaline Phosphatase: 97 U/L (ref 38–126)
Anion gap: 6 (ref 5–15)
BUN: 11 mg/dL (ref 8–23)
CO2: 29 mmol/L (ref 22–32)
Calcium: 9.4 mg/dL (ref 8.9–10.3)
Chloride: 106 mmol/L (ref 98–111)
Creatinine: 0.99 mg/dL (ref 0.44–1.00)
GFR, Estimated: >60 mL/min (ref >60)
Glucose, Bld: 88 mg/dL (ref 70–99)
Potassium: 3.7 mmol/L (ref 3.5–5.1)
Sodium: 141 mmol/L (ref 135–145)
Total Bilirubin: 0.3 mg/dL (ref 0.3–1.2)
Total Protein: 7.2 g/dL (ref 6.5–8.1)

## 2021-11-03 LAB — KAPPA/LAMBDA LIGHT CHAINS
Kappa free light chain: 33.8 mg/L — ABNORMAL HIGH (ref 3.3–19.4)
Kappa, lambda light chain ratio: 1.46 (ref 0.26–1.65)
Lambda free light chains: 23.2 mg/L (ref 5.7–26.3)

## 2021-11-05 LAB — MULTIPLE MYELOMA PANEL, SERUM
Albumin SerPl Elph-Mcnc: 3.8 g/dL (ref 2.9–4.4)
Albumin/Glob SerPl: 1.3 (ref 0.7–1.7)
Alpha 1: 0.2 g/dL (ref 0.0–0.4)
Alpha2 Glob SerPl Elph-Mcnc: 0.8 g/dL (ref 0.4–1.0)
B-Globulin SerPl Elph-Mcnc: 1 g/dL (ref 0.7–1.3)
Gamma Glob SerPl Elph-Mcnc: 1.1 g/dL (ref 0.4–1.8)
Globulin, Total: 3.1 g/dL (ref 2.2–3.9)
IgA: 255 mg/dL (ref 87–352)
IgG (Immunoglobin G), Serum: 1001 mg/dL (ref 586–1602)
IgM (Immunoglobulin M), Srm: 148 mg/dL (ref 26–217)
M Protein SerPl Elph-Mcnc: 0.3 g/dL — ABNORMAL HIGH
Total Protein ELP: 6.9 g/dL (ref 6.0–8.5)

## 2021-11-09 ENCOUNTER — Inpatient Hospital Stay: Payer: Medicare Other | Attending: Hematology and Oncology | Admitting: Hematology and Oncology

## 2021-11-09 DIAGNOSIS — D472 Monoclonal gammopathy: Secondary | ICD-10-CM

## 2021-11-09 NOTE — Assessment & Plan Note (Signed)
09/26/2016: M protein 0.2 g 10/28/20: M-Protein 0.3 gm, IgM Lambda, Kappa: 29.6, Lambda 24.9, ratio 1.19, CBCD and CMP Nrmal except for creatinine 1.17  11/02/2021: M protein 0.3 g, Kappa 33.8, lambda 23.2, ratio 1.46, CBC and CMP normal, creatinine 0.99, calcium 9.4, hemoglobin 12.7  Labs are stable RTC in 1 year with labs done ahead of time

## 2021-11-10 ENCOUNTER — Telehealth: Payer: Self-pay | Admitting: Hematology and Oncology

## 2021-11-10 NOTE — Telephone Encounter (Signed)
Scheduled appointment per 5/23 los. Left message.

## 2021-12-09 ENCOUNTER — Other Ambulatory Visit (HOSPITAL_COMMUNITY): Payer: Self-pay

## 2021-12-09 ENCOUNTER — Other Ambulatory Visit: Payer: Self-pay | Admitting: Internal Medicine

## 2021-12-09 DIAGNOSIS — J300 Vasomotor rhinitis: Secondary | ICD-10-CM

## 2021-12-09 MED ORDER — AZELASTINE HCL 0.1 % NA SOLN
1.0000 | Freq: Two times a day (BID) | NASAL | 1 refills | Status: AC
  Filled 2021-12-09: qty 90, 90d supply, fill #0
  Filled 2021-12-10: qty 30, 50d supply, fill #0

## 2021-12-10 ENCOUNTER — Other Ambulatory Visit (HOSPITAL_COMMUNITY): Payer: Self-pay

## 2022-01-24 ENCOUNTER — Other Ambulatory Visit (HOSPITAL_COMMUNITY): Payer: Self-pay

## 2022-02-18 ENCOUNTER — Other Ambulatory Visit (HOSPITAL_COMMUNITY): Payer: Self-pay

## 2022-03-07 ENCOUNTER — Other Ambulatory Visit: Payer: Self-pay | Admitting: Nurse Practitioner

## 2022-03-09 ENCOUNTER — Other Ambulatory Visit (HOSPITAL_COMMUNITY): Payer: Self-pay

## 2022-05-26 ENCOUNTER — Other Ambulatory Visit: Payer: Self-pay | Admitting: Obstetrics and Gynecology

## 2022-05-26 DIAGNOSIS — Z1231 Encounter for screening mammogram for malignant neoplasm of breast: Secondary | ICD-10-CM

## 2022-07-21 ENCOUNTER — Ambulatory Visit: Payer: Medicare Other

## 2022-08-22 ENCOUNTER — Ambulatory Visit
Admission: RE | Admit: 2022-08-22 | Discharge: 2022-08-22 | Disposition: A | Discharge status: Home / Self Care | Payer: Medicare Other | Source: Ambulatory Visit | Attending: Obstetrics and Gynecology | Admitting: Obstetrics and Gynecology

## 2022-08-22 DIAGNOSIS — Z1231 Encounter for screening mammogram for malignant neoplasm of breast: Secondary | ICD-10-CM | POA: Diagnosis not present

## 2022-09-14 ENCOUNTER — Other Ambulatory Visit: Payer: Self-pay | Admitting: Pulmonary Disease

## 2022-09-14 ENCOUNTER — Ambulatory Visit (HOSPITAL_COMMUNITY)
Admission: RE | Admit: 2022-09-14 | Discharge: 2022-09-14 | Disposition: A | Discharge status: Home / Self Care | Payer: Medicare Other | Source: Ambulatory Visit | Attending: Acute Care | Admitting: Acute Care

## 2022-09-14 DIAGNOSIS — J439 Emphysema, unspecified: Secondary | ICD-10-CM | POA: Diagnosis not present

## 2022-09-14 DIAGNOSIS — Z122 Encounter for screening for malignant neoplasm of respiratory organs: Secondary | ICD-10-CM | POA: Diagnosis not present

## 2022-09-14 DIAGNOSIS — I700 Atherosclerosis of aorta: Secondary | ICD-10-CM | POA: Insufficient documentation

## 2022-09-14 DIAGNOSIS — I2510 Atherosclerotic heart disease of native coronary artery without angina pectoris: Secondary | ICD-10-CM | POA: Diagnosis not present

## 2022-09-14 DIAGNOSIS — J449 Chronic obstructive pulmonary disease, unspecified: Secondary | ICD-10-CM

## 2022-09-14 DIAGNOSIS — Z87891 Personal history of nicotine dependence: Secondary | ICD-10-CM | POA: Diagnosis not present

## 2022-09-14 DIAGNOSIS — I10 Essential (primary) hypertension: Secondary | ICD-10-CM | POA: Insufficient documentation

## 2022-09-14 MED ORDER — SPIRIVA RESPIMAT 2.5 MCG/ACT IN AERS
2.0000 | INHALATION_SPRAY | Freq: Every day | RESPIRATORY_TRACT | 0 refills | Status: DC
  Filled 2022-09-14: qty 4, 30d supply, fill #0

## 2022-09-15 ENCOUNTER — Other Ambulatory Visit: Payer: Self-pay

## 2022-09-15 ENCOUNTER — Other Ambulatory Visit (HOSPITAL_COMMUNITY): Payer: Self-pay

## 2022-09-15 ENCOUNTER — Encounter (HOSPITAL_COMMUNITY): Payer: Self-pay

## 2022-09-15 ENCOUNTER — Other Ambulatory Visit: Payer: Self-pay | Admitting: Acute Care

## 2022-09-15 DIAGNOSIS — Z122 Encounter for screening for malignant neoplasm of respiratory organs: Secondary | ICD-10-CM

## 2022-09-15 DIAGNOSIS — Z87891 Personal history of nicotine dependence: Secondary | ICD-10-CM

## 2022-09-21 ENCOUNTER — Other Ambulatory Visit: Payer: Self-pay

## 2022-10-10 ENCOUNTER — Ambulatory Visit (HOSPITAL_BASED_OUTPATIENT_CLINIC_OR_DEPARTMENT_OTHER): Payer: Medicare Other | Admitting: Pulmonary Disease

## 2022-11-10 ENCOUNTER — Inpatient Hospital Stay: Payer: Medicare Other | Attending: Hematology and Oncology

## 2022-11-10 DIAGNOSIS — R54 Age-related physical debility: Secondary | ICD-10-CM | POA: Diagnosis not present

## 2022-11-10 DIAGNOSIS — B370 Candidal stomatitis: Secondary | ICD-10-CM | POA: Diagnosis not present

## 2022-11-10 DIAGNOSIS — D472 Monoclonal gammopathy: Secondary | ICD-10-CM | POA: Insufficient documentation

## 2022-11-10 DIAGNOSIS — Z515 Encounter for palliative care: Secondary | ICD-10-CM | POA: Diagnosis not present

## 2022-11-10 DIAGNOSIS — R180 Malignant ascites: Secondary | ICD-10-CM | POA: Diagnosis not present

## 2022-11-10 DIAGNOSIS — R110 Nausea: Secondary | ICD-10-CM | POA: Diagnosis not present

## 2022-11-10 LAB — CMP (CANCER CENTER ONLY)
ALT: 9 U/L (ref 0–44)
AST: 13 U/L — ABNORMAL LOW (ref 15–41)
Albumin: 4.3 g/dL (ref 3.5–5.0)
Alkaline Phosphatase: 108 U/L (ref 38–126)
Anion gap: 7 (ref 5–15)
BUN: 12 mg/dL (ref 8–23)
CO2: 30 mmol/L (ref 22–32)
Calcium: 9.6 mg/dL (ref 8.9–10.3)
Chloride: 104 mmol/L (ref 98–111)
Creatinine: 1.05 mg/dL — ABNORMAL HIGH (ref 0.44–1.00)
GFR, Estimated: 58 mL/min — ABNORMAL LOW (ref >60)
Glucose, Bld: 86 mg/dL (ref 70–99)
Potassium: 4 mmol/L (ref 3.5–5.1)
Sodium: 141 mmol/L (ref 135–145)
Total Bilirubin: 0.4 mg/dL (ref 0.3–1.2)
Total Protein: 7.3 g/dL (ref 6.5–8.1)

## 2022-11-10 LAB — CBC WITH DIFFERENTIAL (CANCER CENTER ONLY)
Abs Immature Granulocytes: 0.01 K/uL (ref 0.00–0.07)
Basophils Absolute: 0.1 K/uL (ref 0.0–0.1)
Basophils Relative: 2 %
Eosinophils Absolute: 0.2 K/uL (ref 0.0–0.5)
Eosinophils Relative: 4 %
HCT: 40.9 % (ref 36.0–46.0)
Hemoglobin: 13.3 g/dL (ref 12.0–15.0)
Immature Granulocytes: 0 %
Lymphocytes Relative: 40 %
Lymphs Abs: 1.9 K/uL (ref 0.7–4.0)
MCH: 29.8 pg (ref 26.0–34.0)
MCHC: 32.5 g/dL (ref 30.0–36.0)
MCV: 91.5 fL (ref 80.0–100.0)
Monocytes Absolute: 0.4 K/uL (ref 0.1–1.0)
Monocytes Relative: 9 %
Neutro Abs: 2.1 K/uL (ref 1.7–7.7)
Neutrophils Relative %: 45 %
Platelet Count: 277 K/uL (ref 150–400)
RBC: 4.47 MIL/uL (ref 3.87–5.11)
RDW: 13.9 % (ref 11.5–15.5)
WBC Count: 4.8 K/uL (ref 4.0–10.5)
nRBC: 0 % (ref 0.0–0.2)

## 2022-11-11 LAB — KAPPA/LAMBDA LIGHT CHAINS
Kappa free light chain: 30 mg/L — ABNORMAL HIGH (ref 3.3–19.4)
Kappa, lambda light chain ratio: 1.11 (ref 0.26–1.65)
Lambda free light chains: 27.1 mg/L — ABNORMAL HIGH (ref 5.7–26.3)

## 2022-11-11 NOTE — Progress Notes (Signed)
HEMATOLOGY-ONCOLOGY TELEPHONE VISIT PROGRESS NOTE  I connected with our patient on 11/17/22 at  9:00 AM EDT by telephone and verified that I am speaking with the correct person using two identifiers.  I discussed the limitations, risks, security and privacy concerns of performing an evaluation and management service by telephone and the availability of in person appointments.  I also discussed with the patient that there may be a patient responsible charge related to this service. The patient expressed understanding and agreed to proceed.   History of Present Illness: Robin Rivera is 67 year old with above-mentioned history of MGUS who is here by connection with telephone to discuss results of recently performed lab work.  She does not have any new complaints or concerns.    REVIEW OF SYSTEMS:   Constitutional: Denies fevers, chills or abnormal weight loss All other systems were reviewed with the patient and are negative. Observations/Objective:     Assessment Plan:  Monoclonal gammopathy 09/26/2016: M protein 0.2 g 10/28/20: M-Protein 0.3 gm, IgM Lambda, Kappa: 29.6, Lambda 24.9, ratio 1.19, CBCD and CMP Nrmal except for creatinine 1.17  11/02/2021: M protein 0.3 g, Kappa 33.8, lambda 23.2, ratio 1.46, CBC and CMP normal, creatinine 0.99, calcium 9.4, hemoglobin 12.7 11/09/2021: SPEP: Pending, kappa 30, lambda 27.1, ratio 1.11, CBC CMP normal, creatinine 1.05, calcium 9.6, albumin 4.3, hemoglobin 13.3   Labs are stable.  We will call her with the results of SPEP. RTC in 1 year with labs done ahead of time   I discussed the assessment and treatment plan with the patient. The patient was provided an opportunity to ask questions and all were answered. The patient agreed with the plan and demonstrated an understanding of the instructions. The patient was advised to call back or seek an in-person evaluation if the symptoms worsen or if the condition fails to improve as anticipated.   I provided 12  minutes of non-face-to-face time during this encounter.  This includes time for charting and coordination of care   Tamsen Meek, MD  I Robin Rivera am acting as a scribe for Dr.Vinay Gudena  I have reviewed the above documentation for accuracy and completeness, and I agree with the above.

## 2022-11-15 ENCOUNTER — Encounter (HOSPITAL_BASED_OUTPATIENT_CLINIC_OR_DEPARTMENT_OTHER): Payer: Self-pay | Admitting: Pulmonary Disease

## 2022-11-15 ENCOUNTER — Ambulatory Visit (HOSPITAL_BASED_OUTPATIENT_CLINIC_OR_DEPARTMENT_OTHER): Payer: Medicare Other | Admitting: Pulmonary Disease

## 2022-11-15 VITALS — BP 120/70 | HR 69 | Temp 98.4°F | Ht 64.0 in | Wt 120.6 lb

## 2022-11-15 DIAGNOSIS — J449 Chronic obstructive pulmonary disease, unspecified: Secondary | ICD-10-CM | POA: Diagnosis not present

## 2022-11-15 NOTE — Patient Instructions (Signed)
Follow up in 1 year.

## 2022-11-15 NOTE — Progress Notes (Signed)
[facility]  Chief Complaint  Patient presents with   Follow-up    Follow up.     Constitutional:  BP 120/70 (BP Location: Right Arm, Patient Position: Sitting, Cuff Size: Normal)   Pulse 69   Temp 98.4 F (36.9 C) (Oral)   Ht 5\' 4"  (1.626 m)   Wt 120 lb 9.6 oz (54.7 kg)   SpO2 97%   BMI 20.70 kg/m   Past Medical History:  GERD, Rhinitis, MGUS, Osteoporosis, COVID 08 August 2020  Past Surgical History:  She  has a past surgical history that includes Tonsillectomy; Total abdominal hysterectomy; Cesarean section; Breast surgery (Bilateral); Breast biopsy; Breast excisional biopsy (Right); Colonoscopy; Esophagogastroduodenoscopy; and Knee arthroscopy (08/31/2020).  Brief Summary:  Robin Rivera is a 67 y.o. female former smoker with COPD from emphysema.      Subjective:   She hasn't smoked since 2020.  Breathing okay except in hot weather.  Uses albuterol one or two times per week.  Not having cough, wheeze, chest pain, or sputum.  Sleeping okay.  Physical Exam:   Appearance - well kempt   ENMT - no sinus tenderness, no oral exudate, no LAN, Mallampati 2 airway, no stridor  Respiratory - equal breath sounds bilaterally, no wheezing or rales  CV - s1s2 regular rate and rhythm, no murmurs  Ext - no clubbing, no edema  Skin - no rashes  Psych - normal mood and affect    Pulmonary testing:  Spirometry 01/09/09 >> FEV1 2.20 (86%), FEV1% 70 PFT 02/09/16 >> FEV1 1.46 (53%), FEV1% 61, TLC 6.13 (114%), DLCO 63% PFT 07/08/20 >> FEV1 1.31 (49%), FEV1% 57, DLCO 62%  Chest Imaging:  Low dose CT chest 02/03/16 >> moderate centrilobular emphysema, scattered nodules CT chest 06/11/20 >> sclerotic lesion of sternum, multiple scattered micronodules PET scan 07/20/20 >> no significant uptake LDCT chest 09/15/22 >> emphysema, multiple small nodules  Cardiac Tests:  Echo 04/12/19 >> EF 55 to 60%  Social History:  She   reports that she quit smoking about 3 years ago. Her smoking use included cigarettes. She has a 34.50 pack-year smoking history. She has never used smokeless tobacco. She reports that she does not drink alcohol and does not use drugs.  Family History:  Her family history includes COPD in her mother; Colon cancer in her maternal aunt; Healthy in her son, son, and son; Kidney disease in her mother; Other in her father. She was adopted.     Assessment/Plan:   COPD with emphysema. - spiriva respimat 2.5 mcg two puffs daily - albuterol prn  History of tobacco abuse. - she will need follow up low dose CT chest in March 2025  Time Spent Involved in Patient Care on Day of Examination:  27 minutes  Follow up:   Patient Instructions  Follow up in 1 year  Medication List:   Allergies as of 11/15/2022   No Known Allergies      Medication List        Accurate as of Nov 15, 2022  3:26 PM. If you have any questions, ask your nurse or doctor.          STOP taking these medications    amoxicillin-clavulanate 875-125 MG tablet Commonly known as: AUGMENTIN Stopped by: Coralyn Helling, MD   prednisoLONE acetate 1 % ophthalmic suspension Commonly known as: PRED FORTE Stopped by: Coralyn Helling, MD   Unifine Pentips 31G X 5 MM Misc Generic drug: Insulin Pen Needle Stopped  by: Coralyn Helling, MD       TAKE these medications    albuterol 108 (90 Base) MCG/ACT inhaler Commonly known as: VENTOLIN HFA Inhale 2 puffs into the lungs every 6 (six) hours as needed for wheezing or shortness of breath.   azelastine 0.1 % nasal spray Commonly known as: ASTELIN Place 1 spray into both nostrils 2 times daily. Use in each nostril as directed   Calcium Carbonate-Vitamin D3 600-400 MG-UNIT Tabs Take 2 tablets by mouth daily.   ofloxacin 0.3 % ophthalmic solution Commonly known as: OCUFLOX Place 1 drop into left eye 3 times a day for 1 week, then twice a day for 1 week, then once a day for 14  days   omeprazole 40 MG capsule Commonly known as: PRILOSEC TAKE 1 CAPSULE BY MOUTH IN THE MORNING AND AT BEDTIME   Spiriva Respimat 2.5 MCG/ACT Aers Generic drug: Tiotropium Bromide Monohydrate Inhale 2 puffs into the lungs daily.   Vitamin D (Ergocalciferol) 1.25 MG (50000 UNIT) Caps capsule Commonly known as: DRISDOL Take 1 capsule by mouth every 7 days.        Signature:  Coralyn Helling, MD [facility] Pager - [phone] 11/15/2022, 3:26 PM

## 2022-11-17 ENCOUNTER — Inpatient Hospital Stay: Payer: Medicare Other | Admitting: Hematology and Oncology

## 2022-11-17 DIAGNOSIS — D472 Monoclonal gammopathy: Secondary | ICD-10-CM | POA: Diagnosis not present

## 2022-11-17 NOTE — Addendum Note (Signed)
Addended by: Serena Croissant on: 11/17/2022 08:50 AM   Modules accepted: Orders

## 2022-11-17 NOTE — Assessment & Plan Note (Signed)
09/26/2016: M protein 0.2 g 10/28/20: M-Protein 0.3 gm, IgM Lambda, Kappa: 29.6, Lambda 24.9, ratio 1.19, CBCD and CMP Nrmal except for creatinine 1.17  11/02/2021: M protein 0.3 g, Kappa 33.8, lambda 23.2, ratio 1.46, CBC and CMP normal, creatinine 0.99, calcium 9.4, hemoglobin 12.7 11/09/2021: SPEP: Pending, kappa 30, lambda 27.1, ratio 1.11, CBC CMP normal, creatinine 1.05, calcium 9.6, albumin 4.3, hemoglobin 13.3   Labs are stable.  We will call her with the results of SPEP. RTC in 1 year with labs done ahead of time

## 2022-11-22 LAB — MULTIPLE MYELOMA PANEL, SERUM
Albumin SerPl Elph-Mcnc: 3.7 g/dL (ref 2.9–4.4)
Albumin/Glob SerPl: 1.2 (ref 0.7–1.7)
Alpha 1: 0.2 g/dL (ref 0.0–0.4)
Alpha2 Glob SerPl Elph-Mcnc: 0.8 g/dL (ref 0.4–1.0)
B-Globulin SerPl Elph-Mcnc: 1 g/dL (ref 0.7–1.3)
Gamma Glob SerPl Elph-Mcnc: 1 g/dL (ref 0.4–1.8)
Globulin, Total: 3.1 g/dL (ref 2.2–3.9)
IgA: 255 mg/dL (ref 87–352)
IgG (Immunoglobin G), Serum: 1030 mg/dL (ref 586–1602)
IgM (Immunoglobulin M), Srm: 153 mg/dL (ref 26–217)
M Protein SerPl Elph-Mcnc: 0.3 g/dL — ABNORMAL HIGH
Total Protein ELP: 6.8 g/dL (ref 6.0–8.5)

## 2023-04-12 ENCOUNTER — Other Ambulatory Visit: Payer: Self-pay

## 2023-05-29 ENCOUNTER — Telehealth: Payer: Medicare Other | Admitting: Family Medicine

## 2023-05-29 DIAGNOSIS — J019 Acute sinusitis, unspecified: Secondary | ICD-10-CM

## 2023-05-29 DIAGNOSIS — B9689 Other specified bacterial agents as the cause of diseases classified elsewhere: Secondary | ICD-10-CM

## 2023-05-29 MED ORDER — DOXYCYCLINE HYCLATE 100 MG PO TABS: 100.0000 mg | ORAL_TABLET | Freq: Two times a day (BID) | ORAL | 0 refills | Status: AC

## 2023-05-29 NOTE — Progress Notes (Signed)
E-Visit for Sinus Problems  We are sorry that you are not feeling well.  Here is how we plan to help!  Based on what you have shared with me it looks like you have sinusitis.  Sinusitis is inflammation and infection in the sinus cavities of the head.  Based on your presentation I believe you most likely have Acute Bacterial Sinusitis.  This is an infection caused by bacteria and is treated with antibiotics. I have prescribed Doxycycline 100mg by mouth twice a day for 10 days. You may use an oral decongestant such as Mucinex D or if you have glaucoma or high blood pressure use plain Mucinex. Saline nasal spray help and can safely be used as often as needed for congestion.  If you develop worsening sinus pain, fever or notice severe headache and vision changes, or if symptoms are not better after completion of antibiotic, please schedule an appointment with a health care provider.    Sinus infections are not as easily transmitted as other respiratory infection, however we still recommend that you avoid close contact with loved ones, especially the very young and elderly.  Remember to wash your hands thoroughly throughout the day as this is the number one way to prevent the spread of infection!  Home Care: Only take medications as instructed by your medical team. Complete the entire course of an antibiotic. Do not take these medications with alcohol. A steam or ultrasonic humidifier can help congestion.  You can place a towel over your head and breathe in the steam from hot water coming from a faucet. Avoid close contacts especially the very young and the elderly. Cover your mouth when you cough or sneeze. Always remember to wash your hands.  Get Help Right Away If: You develop worsening fever or sinus pain. You develop a severe head ache or visual changes. Your symptoms persist after you have completed your treatment plan.  Make sure you Understand these instructions. Will watch your  condition. Will get help right away if you are not doing well or get worse.  Thank you for choosing an e-visit.  Your e-visit answers were reviewed by a board certified advanced clinical practitioner to complete your personal care plan. Depending upon the condition, your plan could have included both over the counter or prescription medications.  Please review your pharmacy choice. Make sure the pharmacy is open so you can pick up prescription now. If there is a problem, you may contact your provider through MyChart messaging and have the prescription routed to another pharmacy.  Your safety is important to us. If you have drug allergies check your prescription carefully.   For the next 24 hours you can use MyChart to ask questions about today's visit, request a non-urgent call back, or ask for a work or school excuse. You will get an email in the next two days asking about your experience. I hope that your e-visit has been valuable and will speed your recovery.  I provided 5 minutes of non face-to-face time during this encounter for chart review, medication and order placement, as well as and documentation.   

## 2023-09-12 DIAGNOSIS — M25511 Pain in right shoulder: Secondary | ICD-10-CM | POA: Diagnosis not present

## 2023-09-15 ENCOUNTER — Ambulatory Visit (HOSPITAL_COMMUNITY)
Admission: RE | Admit: 2023-09-15 | Discharge: 2023-09-15 | Disposition: A | Discharge status: Home / Self Care | Source: Ambulatory Visit | Attending: Acute Care | Admitting: Acute Care

## 2023-09-15 DIAGNOSIS — Z87891 Personal history of nicotine dependence: Secondary | ICD-10-CM | POA: Diagnosis not present

## 2023-09-15 DIAGNOSIS — Z122 Encounter for screening for malignant neoplasm of respiratory organs: Secondary | ICD-10-CM | POA: Insufficient documentation

## 2023-10-06 ENCOUNTER — Other Ambulatory Visit: Payer: Self-pay

## 2023-10-13 ENCOUNTER — Encounter (HOSPITAL_COMMUNITY): Payer: Self-pay

## 2023-10-13 ENCOUNTER — Other Ambulatory Visit (HOSPITAL_COMMUNITY): Payer: Self-pay

## 2023-10-13 ENCOUNTER — Other Ambulatory Visit: Payer: Self-pay | Admitting: Pulmonary Disease

## 2023-10-13 DIAGNOSIS — J449 Chronic obstructive pulmonary disease, unspecified: Secondary | ICD-10-CM

## 2023-10-16 MED ORDER — SPIRIVA RESPIMAT 2.5 MCG/ACT IN AERS
2.0000 | INHALATION_SPRAY | Freq: Every day | RESPIRATORY_TRACT | 0 refills | Status: AC
  Filled 2023-10-16 – 2023-10-17 (×2): qty 4, 30d supply, fill #0

## 2023-10-17 ENCOUNTER — Other Ambulatory Visit: Payer: Self-pay

## 2023-10-17 ENCOUNTER — Other Ambulatory Visit (HOSPITAL_COMMUNITY): Payer: Self-pay

## 2023-10-17 ENCOUNTER — Encounter: Payer: Self-pay | Admitting: Pharmacist

## 2023-10-23 ENCOUNTER — Telehealth: Payer: Self-pay | Admitting: Acute Care

## 2023-10-23 ENCOUNTER — Other Ambulatory Visit: Payer: Self-pay

## 2023-10-23 DIAGNOSIS — Z87891 Personal history of nicotine dependence: Secondary | ICD-10-CM

## 2023-10-23 DIAGNOSIS — Z122 Encounter for screening for malignant neoplasm of respiratory organs: Secondary | ICD-10-CM

## 2023-10-23 NOTE — Telephone Encounter (Signed)
 I have called and spoke with the patient and reviewed results. She will complete her annual Lung CT again next year. Reviewed enlarged pulmonary artery, Atherosclerotic calcification of the aorta and coronary arteries. Pt confirms she is not taking a statin.  Pt advises it has been several years since she has seen her Cardiologist. Results and plan will be sent to her PCP and Cardiologist. I have included Dr. Rochelle Chu and Dr. Danley Dusky on this message. Annual Lung CT order placed.   Margretta Shi, RN

## 2023-10-23 NOTE — Telephone Encounter (Signed)
 Please call patient and let her know her scan was fine from an lung cancer perspective, she needs a one year follow up scan. Let her know there was notation of am enlarged pulmonary artery , as we have seen before. It has been 4.5 years since her last echo to evaluate pulmonary artery pressures. Please call her PCP office and leave a message for Dr. Rochelle Chu regarding the finding so he can follow up as he feels is clinically indicated. There was notation of Atherosclerotic calcification of the aorta and coronary arteries.She is not on a statin. Thanks so much. 12 month follow up LDCT.    Atherosclerotic calcification of the aorta and coronary arteries. Markedly enlarged proximal left pulmonary artery, measuring 4.1 cm. Heart size normal. No pericardial effusion.

## 2023-10-31 NOTE — Telephone Encounter (Signed)
 Copied from CRM [id number]. Topic: Appointments - Appointment Scheduling >> Oct 31, 2023  1:15 PM Alverda Joe S wrote: Patient/patient representative is calling to schedule an appointment. Refer to attachments for appointment information.  Patient can't afford the spariva nebulizer she is on and dr.dewald told her that we can find her a cheaper solution. Please contact patient and dr.dewald.    LM for patient ok per HIPAA and reminded her of up coming appt. On 06/26

## 2023-11-06 ENCOUNTER — Other Ambulatory Visit (HOSPITAL_COMMUNITY): Payer: Self-pay

## 2023-11-06 ENCOUNTER — Telehealth: Payer: Self-pay

## 2023-11-06 NOTE — Telephone Encounter (Signed)
 Which LAMA inhaler is best covered by her insurance?  [name], Please schedule her for a follow up visit with me in the coming months. Can use a blocked slot.  Thanks, JD

## 2023-11-06 NOTE — Telephone Encounter (Signed)
 Due to patient deductible all options are $302 at this time.   Sent to office in original message.

## 2023-11-06 NOTE — Telephone Encounter (Signed)
 Patient has a deductible making all options $302.00 at this time.

## 2023-11-07 ENCOUNTER — Inpatient Hospital Stay: Attending: Hematology and Oncology

## 2023-11-07 DIAGNOSIS — D472 Monoclonal gammopathy: Secondary | ICD-10-CM | POA: Diagnosis present

## 2023-11-07 LAB — CBC WITH DIFFERENTIAL (CANCER CENTER ONLY)
Abs Immature Granulocytes: 0.01 10*3/uL (ref 0.00–0.07)
Basophils Absolute: 0.1 10*3/uL (ref 0.0–0.1)
Basophils Relative: 2 %
Eosinophils Absolute: 0.1 10*3/uL (ref 0.0–0.5)
Eosinophils Relative: 3 %
HCT: 43.3 % (ref 36.0–46.0)
Hemoglobin: 14.2 g/dL (ref 12.0–15.0)
Immature Granulocytes: 0 %
Lymphocytes Relative: 27 %
Lymphs Abs: 1.3 10*3/uL (ref 0.7–4.0)
MCH: 28.9 pg (ref 26.0–34.0)
MCHC: 32.8 g/dL (ref 30.0–36.0)
MCV: 88.2 fL (ref 80.0–100.0)
Monocytes Absolute: 0.4 10*3/uL (ref 0.1–1.0)
Monocytes Relative: 7 %
Neutro Abs: 3.1 10*3/uL (ref 1.7–7.7)
Neutrophils Relative %: 61 %
Platelet Count: 313 10*3/uL (ref 150–400)
RBC: 4.91 MIL/uL (ref 3.87–5.11)
RDW: 14.6 % (ref 11.5–15.5)
WBC Count: 5 10*3/uL (ref 4.0–10.5)
nRBC: 0 % (ref 0.0–0.2)

## 2023-11-07 LAB — CMP (CANCER CENTER ONLY)
ALT: 11 U/L (ref 0–44)
AST: 13 U/L — ABNORMAL LOW (ref 15–41)
Albumin: 4.5 g/dL (ref 3.5–5.0)
Alkaline Phosphatase: 83 U/L (ref 38–126)
Anion gap: 6 (ref 5–15)
BUN: 10 mg/dL (ref 8–23)
CO2: 30 mmol/L (ref 22–32)
Calcium: 9.9 mg/dL (ref 8.9–10.3)
Chloride: 104 mmol/L (ref 98–111)
Creatinine: 0.86 mg/dL (ref 0.44–1.00)
GFR, Estimated: >60 mL/min (ref >60)
Glucose, Bld: 92 mg/dL (ref 70–99)
Potassium: 3.8 mmol/L (ref 3.5–5.1)
Sodium: 140 mmol/L (ref 135–145)
Total Bilirubin: 0.4 mg/dL (ref 0.0–1.2)
Total Protein: 7.4 g/dL (ref 6.5–8.1)

## 2023-11-08 LAB — KAPPA/LAMBDA LIGHT CHAINS
Kappa free light chain: 24.8 mg/L — ABNORMAL HIGH (ref 3.3–19.4)
Kappa, lambda light chain ratio: 1.11 (ref 0.26–1.65)
Lambda free light chains: 22.4 mg/L (ref 5.7–26.3)

## 2023-11-09 LAB — MULTIPLE MYELOMA PANEL, SERUM
Albumin SerPl Elph-Mcnc: 3.5 g/dL (ref 2.9–4.4)
Albumin/Glob SerPl: 1.2 (ref 0.7–1.7)
Alpha 1: 0.3 g/dL (ref 0.0–0.4)
Alpha2 Glob SerPl Elph-Mcnc: 0.8 g/dL (ref 0.4–1.0)
B-Globulin SerPl Elph-Mcnc: 1 g/dL (ref 0.7–1.3)
Gamma Glob SerPl Elph-Mcnc: 0.9 g/dL (ref 0.4–1.8)
Globulin, Total: 3 g/dL (ref 2.2–3.9)
IgA: 254 mg/dL (ref 87–352)
IgG (Immunoglobin G), Serum: 873 mg/dL (ref 586–1602)
IgM (Immunoglobulin M), Srm: 152 mg/dL (ref 26–217)
Total Protein ELP: 6.5 g/dL (ref 6.0–8.5)

## 2023-12-14 ENCOUNTER — Other Ambulatory Visit (HOSPITAL_COMMUNITY): Payer: Self-pay

## 2023-12-14 ENCOUNTER — Encounter: Payer: Self-pay | Admitting: Pulmonary Disease

## 2023-12-14 ENCOUNTER — Ambulatory Visit: Admitting: Pulmonary Disease

## 2023-12-14 VITALS — BP 122/84 | HR 73 | Ht 64.0 in | Wt 95.0 lb

## 2023-12-14 DIAGNOSIS — Z87891 Personal history of nicotine dependence: Secondary | ICD-10-CM

## 2023-12-14 DIAGNOSIS — J449 Chronic obstructive pulmonary disease, unspecified: Secondary | ICD-10-CM

## 2023-12-14 DIAGNOSIS — R636 Underweight: Secondary | ICD-10-CM | POA: Diagnosis not present

## 2023-12-14 MED ORDER — ALBUTEROL SULFATE HFA 108 (90 BASE) MCG/ACT IN AERS
2.0000 | INHALATION_SPRAY | Freq: Four times a day (QID) | RESPIRATORY_TRACT | 5 refills | Status: AC | PRN
  Filled 2023-12-14: qty 6.7, fill #0
  Filled 2023-12-14: qty 6.7, 16d supply, fill #0
  Filled 2024-03-08: qty 6.7, 16d supply, fill #1

## 2023-12-14 MED ORDER — MIRTAZAPINE 7.5 MG PO TABS
7.5000 mg | ORAL_TABLET | Freq: Every day | ORAL | 1 refills | Status: DC
  Filled 2023-12-14 (×2): qty 30, 30d supply, fill #0
  Filled 2024-01-08: qty 30, 30d supply, fill #1

## 2023-12-14 NOTE — Progress Notes (Signed)
 Synopsis: Hx of COPD  Subjective:   PATIENT ID: Robin Rivera GENDER: female DOB: Mar 21, 1956, MRN: [id number]   HPI  Chief Complaint  Patient presents with   Consult   Robin Rivera is a 68 year old woman, former smoker with GERD and COPD who returns to pulmonary clinic for follow up.  She has been without Spiriva  for a month due to financial constraints and has been using Anoro, which she obtained from her ex-husband. Anoro provides better relief than Spiriva , allowing her to go a week without needing additional medication. She has also run out of albuterol  and has not been able to refill it.  She experiences shortness of breath with a dry cough but has no wheezing or mucus production. She has not been on oxygen therapy. Her lung cancer screening was last done in March, and she is up to date.   She has lost 25 pounds over the last three months, attributed to stress related to her sons' legal issues. Her appetite has significantly decreased, and she is struggling to regain weight.   Past Medical History:  Diagnosis Date   COPD (chronic obstructive pulmonary disease) (HCC)    Esophageal stricture    GERD (gastroesophageal reflux disease)    Monoclonal gammopathy    unknown significance, followed at [facility] [address]    Osteoporosis    Rhinitis    Tobacco abuse      Family History  Adopted: Yes  Problem Relation Age of Onset   COPD Mother    Kidney disease Mother    Other Father        his hx is unknown   Colon cancer Maternal Aunt    Healthy Son    Healthy Son    Healthy Son      Social History   Socioeconomic History   Marital status: Married    Spouse name: Not on file   Number of children: 3   Years of education: 12   Highest education level: Not on file  Occupational History   Occupation: Medical sales representative     Employer: [employer]  Tobacco Use   Smoking status: Former    Current packs/day: 0.00    Average packs/day: 0.8  packs/day for 46.0 years (34.5 ttl pk-yrs)    Types: Cigarettes    Start date: 12/1972    Quit date: 12/2018    Years since quitting: 4.9   Smokeless tobacco: Never  Vaping Use   Vaping status: Never Used  Substance and Sexual Activity   Alcohol use: No    Alcohol/week: 0.0 standard drinks of alcohol   Drug use: No   Sexual activity: Yes    Partners: Male    Birth control/protection: Surgical  Other Topics Concern   Not on file  Social History Narrative   Fun: Play games on her computer, bon fires   Denies abuse and feels safe at home.    Social Drivers of Corporate investment banker Strain: Not on file  Food Insecurity: Not on file  Transportation Needs: Not on file  Physical Activity: Not on file  Stress: Not on file  Social Connections: Not on file  Intimate Partner Violence: Not on file     No Known Allergies   Outpatient Medications Prior to Visit  Medication Sig Dispense Refill   azelastine  (ASTELIN ) 0.1 % nasal spray Place 1 spray into both nostrils 2 times daily. Use in each nostril as directed 90 mL 1  Calcium Carb-Cholecalciferol (CALCIUM CARBONATE-VITAMIN D3) 600-400 MG-UNIT TABS Take 2 tablets by mouth daily.     ofloxacin  (OCUFLOX ) 0.3 % ophthalmic solution Place 1 drop into left eye 3 times a day for 1 week, then twice a day for 1 week, then once a day for 14 days (Patient not taking: Reported on 03/16/2021) 5 mL 0   omeprazole  (PRILOSEC) 40 MG capsule TAKE 1 CAPSULE BY MOUTH IN THE MORNING AND AT BEDTIME 180 capsule 1   SPIRIVA  RESPIMAT 2.5 MCG/ACT AERS Inhale 2 puffs into the lungs daily. 4 g 0   Vitamin D , Ergocalciferol , (DRISDOL ) 1.25 MG (50000 UNIT) CAPS capsule Take 1 capsule by mouth every 7 days. (Patient not taking: Reported on 03/16/2021) 12 capsule 0   albuterol  (VENTOLIN  HFA) 108 (90 Base) MCG/ACT inhaler Inhale 2 puffs into the lungs every 6 (six) hours as needed for wheezing or shortness of breath. 6.7 g 5   No facility-administered medications  prior to visit.    Review of Systems  Constitutional:  Positive for weight loss. Negative for chills, fever and malaise/fatigue.  HENT:  Negative for congestion, sinus pain and sore throat.   Eyes: Negative.   Respiratory:  Positive for cough and shortness of breath. Negative for hemoptysis, sputum production and wheezing.   Cardiovascular:  Negative for chest pain, palpitations, orthopnea, claudication and leg swelling.  Gastrointestinal:  Negative for abdominal pain, heartburn, nausea and vomiting.  Genitourinary: Negative.   Musculoskeletal:  Negative for joint pain and myalgias.  Skin:  Negative for rash.  Neurological:  Negative for weakness.  Endo/Heme/Allergies: Negative.   Psychiatric/Behavioral: Negative.     Objective:   Vitals:   12/14/23 1539  BP: 122/84  Pulse: 73  SpO2: 97%  Weight: 95 lb (43.1 kg)  Height: 5' 4 (1.626 m)   Physical Exam Constitutional:      General: She is not in acute distress.    Appearance: Normal appearance. She is underweight.   Eyes:     General: No scleral icterus.    Conjunctiva/sclera: Conjunctivae normal.    Cardiovascular:     Rate and Rhythm: Normal rate and regular rhythm.  Pulmonary:     Breath sounds: No wheezing, rhonchi or rales.   Musculoskeletal:     Right lower leg: No edema.     Left lower leg: No edema.   Skin:    General: Skin is warm and dry.   Neurological:     General: No focal deficit present.     CBC    Component Value Date/Time   WBC 5.0 11/07/2023 1154   WBC 5.7 05/18/2021 1521   RBC 4.91 11/07/2023 1154   HGB 14.2 11/07/2023 1154   HCT 43.3 11/07/2023 1154   PLT 313 11/07/2023 1154   MCV 88.2 11/07/2023 1154   MCH 28.9 11/07/2023 1154   MCHC 32.8 11/07/2023 1154   RDW 14.6 11/07/2023 1154   LYMPHSABS 1.3 11/07/2023 1154   MONOABS 0.4 11/07/2023 1154   EOSABS 0.1 11/07/2023 1154   BASOSABS 0.1 11/07/2023 1154      Latest Ref Rng & Units 11/07/2023   11:54 AM 11/10/2022    8:41 AM  11/02/2021    8:46 AM  BMP  Glucose 70 - 99 mg/dL 92  86  88   BUN 8 - 23 mg/dL 10  12  11    Creatinine 0.44 - 1.00 mg/dL 9.13  8.94  9.00   Sodium 135 - 145 mmol/L 140  141  141  Potassium 3.5 - 5.1 mmol/L 3.8  4.0  3.7   Chloride 98 - 111 mmol/L 104  104  106   CO2 22 - 32 mmol/L 30  30  29    Calcium 8.9 - 10.3 mg/dL 9.9  9.6  9.4    Chest imaging: LCS CT Chest 09/15/23 1. Lung-RADS 2, benign appearance or behavior. Continue annual screening with low-dose chest CT without contrast in 12 months. 2. Markedly enlarged proximal left pulmonary artery. 3. Aortic atherosclerosis (ICD10-I70.0). Coronary artery calcification. 4.  Emphysema (ICD10-J43.9).  PFT:    Latest Ref Rng & Units 07/08/2020    3:46 PM 02/09/2016   10:01 AM  PFT Results  FVC-Pre L 2.31  2.45   FVC-Predicted Pre % 67  69   FVC-Post L  2.39   FVC-Predicted Post %  67   Pre FEV1/FVC % % 57  59   Post FEV1/FCV % %  61   FEV1-Pre L 1.31  1.45   FEV1-Predicted Pre % 49  52   FEV1-Post L  1.46   DLCO uncorrected ml/min/mmHg 13.36  17.05   DLCO UNC% % 62  63   DLCO corrected ml/min/mmHg 13.36  17.49   DLCO COR %Predicted % 62  64   DLVA Predicted % 78  68   TLC L  6.13   TLC % Predicted %  114   RV % Predicted %  174     Labs:  Path:  Echo:  Heart Catheterization:    Assessment & Plan:   Chronic obstructive pulmonary disease, unspecified COPD type (HCC) - Plan: Pulmonary Function Test, albuterol  (VENTOLIN  HFA) 108 (90 Base) MCG/ACT inhaler  Underweight - Plan: mirtazapine  (REMERON ) 7.5 MG tablet  Discussion: Lear Carstens is a 68 year old woman, former smoker with GERD and COPD who returns to pulmonary clinic for follow up.  Chronic obstructive pulmonary disease (COPD) - Provide Trelegy samples for one month. - She is to let us  know if she would like prescription. - Refill albuterol  prescription. - Schedule pulmonary function test at next follow-up or convenient time.  Unintentional weight  loss 25-pound weight loss over three months due to stress. Poor appetite. Discussed nutritional supplements and pharmacological options. - Recommend nutritional supplements: Boost, Ensure, or whey protein powder. - Prescribe mirtazapine  7.5 mg at night. - Increase mirtazapine  to 15 mg after one month if no improvement.  Follow up in 6 months  Dorn Chill, MD [facility] Office: [phone]    Current Outpatient Medications:    mirtazapine  (REMERON ) 7.5 MG tablet, Take 1 tablet (7.5 mg total) by mouth at bedtime., Disp: 30 tablet, Rfl: 1   albuterol  (VENTOLIN  HFA) 108 (90 Base) MCG/ACT inhaler, Inhale 2 puffs into the lungs every 6 (six) hours as needed for wheezing or shortness of breath., Disp: 6.7 g, Rfl: 5   azelastine  (ASTELIN ) 0.1 % nasal spray, Place 1 spray into both nostrils 2 times daily. Use in each nostril as directed, Disp: 90 mL, Rfl: 1   Calcium Carb-Cholecalciferol (CALCIUM CARBONATE-VITAMIN D3) 600-400 MG-UNIT TABS, Take 2 tablets by mouth daily., Disp: , Rfl:    ofloxacin  (OCUFLOX ) 0.3 % ophthalmic solution, Place 1 drop into left eye 3 times a day for 1 week, then twice a day for 1 week, then once a day for 14 days (Patient not taking: Reported on 03/16/2021), Disp: 5 mL, Rfl: 0   omeprazole  (PRILOSEC) 40 MG capsule, TAKE 1 CAPSULE BY MOUTH IN THE MORNING AND AT BEDTIME,  Disp: 180 capsule, Rfl: 1   SPIRIVA  RESPIMAT 2.5 MCG/ACT AERS, Inhale 2 puffs into the lungs daily., Disp: 4 g, Rfl: 0   Vitamin D , Ergocalciferol , (DRISDOL ) 1.25 MG (50000 UNIT) CAPS capsule, Take 1 capsule by mouth every 7 days. (Patient not taking: Reported on 03/16/2021), Disp: 12 capsule, Rfl: 0

## 2023-12-14 NOTE — Patient Instructions (Addendum)
 Try Trelegy ellipta 1 puff daily - rinse mouth out after each use - let us  know if you prefer the trelegy or anoro and I will send in a prescription  Use albuterol  inhaler 1-2 puffs every 4-6 hours as needed  Consider getting whey protein or drinking Ensure/Boosts to help prevent further weight loss  Start mirtazipine 7.5mg  at bedtime to help you gain weight, reduce anxiety/stress - we can increase to 15mg  nightly if needed in the future  Follow up in 6 months for pulmonary function tests

## 2023-12-15 ENCOUNTER — Encounter: Payer: Self-pay | Admitting: Pulmonary Disease

## 2024-01-08 ENCOUNTER — Other Ambulatory Visit (HOSPITAL_COMMUNITY): Payer: Self-pay

## 2024-02-05 ENCOUNTER — Other Ambulatory Visit: Payer: Self-pay | Admitting: Pulmonary Disease

## 2024-02-05 ENCOUNTER — Encounter: Payer: Self-pay | Admitting: Pulmonary Disease

## 2024-02-05 DIAGNOSIS — R636 Underweight: Secondary | ICD-10-CM

## 2024-02-06 NOTE — Telephone Encounter (Signed)
**Note De-identified  [place] Obfuscation** Please advise 

## 2024-02-07 ENCOUNTER — Other Ambulatory Visit (HOSPITAL_COMMUNITY): Payer: Self-pay

## 2024-02-07 MED ORDER — MIRTAZAPINE 7.5 MG PO TABS
7.5000 mg | ORAL_TABLET | Freq: Every day | ORAL | 1 refills | Status: DC
  Filled 2024-02-07: qty 30, 30d supply, fill #0
  Filled 2024-03-08: qty 30, 30d supply, fill #1

## 2024-02-11 ENCOUNTER — Telehealth: Admitting: Physician Assistant

## 2024-02-11 DIAGNOSIS — B9689 Other specified bacterial agents as the cause of diseases classified elsewhere: Secondary | ICD-10-CM | POA: Diagnosis not present

## 2024-02-11 DIAGNOSIS — J019 Acute sinusitis, unspecified: Secondary | ICD-10-CM

## 2024-02-11 MED ORDER — AMOXICILLIN-POT CLAVULANATE 875-125 MG PO TABS: 1.0000 | ORAL_TABLET | Freq: Two times a day (BID) | ORAL | 0 refills | Status: AC

## 2024-02-11 NOTE — Progress Notes (Signed)
 I have spent 5 minutes in review of e-visit questionnaire, review and updating patient chart, medical decision making and response to patient.   Laure Kidney, PA-C

## 2024-02-11 NOTE — Progress Notes (Signed)

## 2024-03-08 ENCOUNTER — Other Ambulatory Visit: Payer: Self-pay

## 2024-03-08 ENCOUNTER — Other Ambulatory Visit (HOSPITAL_COMMUNITY): Payer: Self-pay

## 2024-03-11 ENCOUNTER — Telehealth: Payer: Self-pay | Admitting: Pulmonary Disease

## 2024-03-11 ENCOUNTER — Other Ambulatory Visit: Payer: Self-pay

## 2024-03-11 ENCOUNTER — Encounter: Payer: Self-pay | Admitting: Pharmacist

## 2024-03-11 ENCOUNTER — Other Ambulatory Visit (HOSPITAL_COMMUNITY): Payer: Self-pay

## 2024-03-11 ENCOUNTER — Ambulatory Visit (INDEPENDENT_AMBULATORY_CARE_PROVIDER_SITE_OTHER)

## 2024-03-11 DIAGNOSIS — R052 Subacute cough: Secondary | ICD-10-CM | POA: Diagnosis not present

## 2024-03-11 MED ORDER — PREDNISONE 20 MG PO TABS
ORAL_TABLET | ORAL | 0 refills | Status: AC
  Filled 2024-03-11 (×2): qty 15, 10d supply, fill #0

## 2024-03-11 NOTE — Telephone Encounter (Signed)
 Patient reports productive cough now for 5 weeks.  Sounds like she was cleaning in the days preceding, dusty areas etc.  Also some concern for mold with recent bathroom work and lack of ventilation.  She reports good to hear it is the Trelegy.  Not improving antibiotic therapy.  Prednisone  taper sent.  Chest x-ray ordered.  Suspect subacute bronchitis likely triggered with environmental allergens, may not improve until allergens are identified and eradicated.

## 2024-03-11 NOTE — Telephone Encounter (Signed)
 Yes, okay for patient to walk in for CXR.  Lm to make pt aware.

## 2024-03-12 ENCOUNTER — Other Ambulatory Visit: Payer: Self-pay

## 2024-03-13 ENCOUNTER — Ambulatory Visit: Payer: Self-pay | Admitting: Pulmonary Disease

## 2024-03-13 NOTE — Telephone Encounter (Signed)
 Chest x-ray without opacities, so less concern for pneumonia.  Please have her call us  back if symptoms not resolving with prednisone  taper.  Dr. Kara

## 2024-03-13 NOTE — Telephone Encounter (Signed)
 VM/LM cell phone okay per DPR -  relayed message to give us  a call back for Rx   -NFN

## 2024-04-07 ENCOUNTER — Other Ambulatory Visit: Payer: Self-pay | Admitting: Pulmonary Disease

## 2024-04-07 DIAGNOSIS — R636 Underweight: Secondary | ICD-10-CM

## 2024-04-10 ENCOUNTER — Other Ambulatory Visit (HOSPITAL_COMMUNITY): Payer: Self-pay

## 2024-04-10 MED ORDER — MIRTAZAPINE 7.5 MG PO TABS
7.5000 mg | ORAL_TABLET | Freq: Every day | ORAL | 1 refills | Status: DC
  Filled 2024-04-10: qty 30, 30d supply, fill #0
  Filled 2024-05-06: qty 30, 30d supply, fill #1

## 2024-05-01 ENCOUNTER — Other Ambulatory Visit: Payer: Self-pay | Admitting: Internal Medicine

## 2024-05-06 ENCOUNTER — Other Ambulatory Visit: Payer: Self-pay

## 2024-06-04 ENCOUNTER — Other Ambulatory Visit: Payer: Self-pay

## 2024-06-04 ENCOUNTER — Other Ambulatory Visit: Payer: Self-pay | Admitting: Pulmonary Disease

## 2024-06-04 ENCOUNTER — Other Ambulatory Visit (HOSPITAL_COMMUNITY): Payer: Self-pay

## 2024-06-04 DIAGNOSIS — R636 Underweight: Secondary | ICD-10-CM

## 2024-06-04 MED ORDER — MIRTAZAPINE 7.5 MG PO TABS
7.5000 mg | ORAL_TABLET | Freq: Every day | ORAL | 1 refills | Status: AC
  Filled 2024-06-04: qty 30, 30d supply, fill #0
  Filled 2024-07-04: qty 30, 30d supply, fill #1

## 2024-07-04 ENCOUNTER — Other Ambulatory Visit: Payer: Self-pay

## 2024-07-04 ENCOUNTER — Other Ambulatory Visit (HOSPITAL_COMMUNITY): Payer: Self-pay
